# Patient Record
Sex: Female | Born: 1935 | Race: White | Hispanic: No | Smoking: Former smoker
Health system: Southern US, Community
[De-identification: ages and names within clinical notes are randomized; demographics above are authoritative.]

## PROBLEM LIST (undated history)

## (undated) DIAGNOSIS — I10 Essential (primary) hypertension: Secondary | ICD-10-CM

## (undated) DIAGNOSIS — G629 Polyneuropathy, unspecified: Secondary | ICD-10-CM

## (undated) DIAGNOSIS — H3530 Unspecified macular degeneration: Secondary | ICD-10-CM

## (undated) DIAGNOSIS — F32A Depression, unspecified: Secondary | ICD-10-CM

## (undated) DIAGNOSIS — F419 Anxiety disorder, unspecified: Secondary | ICD-10-CM

## (undated) DIAGNOSIS — F329 Major depressive disorder, single episode, unspecified: Secondary | ICD-10-CM

## (undated) DIAGNOSIS — L853 Xerosis cutis: Secondary | ICD-10-CM

## (undated) DIAGNOSIS — H6120 Impacted cerumen, unspecified ear: Secondary | ICD-10-CM

## (undated) DIAGNOSIS — M1990 Unspecified osteoarthritis, unspecified site: Secondary | ICD-10-CM

## (undated) HISTORY — DX: Unspecified osteoarthritis, unspecified site: M19.90

## (undated) HISTORY — PX: APPENDECTOMY: SHX54

## (undated) HISTORY — DX: Impacted cerumen, unspecified ear: H61.20

## (undated) HISTORY — PX: CHOLECYSTECTOMY: SHX55

## (undated) HISTORY — PX: RHINOPLASTY: SUR1284

## (undated) HISTORY — PX: SHOULDER SURGERY: SHX246

## (undated) HISTORY — DX: Unspecified macular degeneration: H35.30

## (undated) HISTORY — DX: Anxiety disorder, unspecified: F41.9

## (undated) HISTORY — DX: Depression, unspecified: F32.A

## (undated) HISTORY — DX: Xerosis cutis: L85.3

## (undated) HISTORY — DX: Essential (primary) hypertension: I10

## (undated) HISTORY — DX: Polyneuropathy, unspecified: G62.9

## (undated) HISTORY — DX: Major depressive disorder, single episode, unspecified: F32.9

---

## 1993-08-23 DIAGNOSIS — K5190 Ulcerative colitis, unspecified, without complications: Secondary | ICD-10-CM | POA: Insufficient documentation

## 1999-06-22 ENCOUNTER — Encounter: Payer: Self-pay | Admitting: Family Medicine

## 1999-06-22 ENCOUNTER — Encounter: Admission: RE | Admit: 1999-06-22 | Discharge: 1999-06-22 | Payer: Self-pay | Admitting: Family Medicine

## 1999-07-01 ENCOUNTER — Other Ambulatory Visit: Admission: RE | Admit: 1999-07-01 | Discharge: 1999-07-01 | Payer: Self-pay | Admitting: Family Medicine

## 2000-07-01 ENCOUNTER — Encounter: Payer: Self-pay | Admitting: Family Medicine

## 2000-07-01 ENCOUNTER — Encounter: Admission: RE | Admit: 2000-07-01 | Discharge: 2000-07-01 | Payer: Self-pay | Admitting: Family Medicine

## 2000-09-09 ENCOUNTER — Other Ambulatory Visit: Admission: RE | Admit: 2000-09-09 | Discharge: 2000-09-09 | Payer: Self-pay | Admitting: Family Medicine

## 2000-12-21 DIAGNOSIS — G609 Hereditary and idiopathic neuropathy, unspecified: Secondary | ICD-10-CM | POA: Insufficient documentation

## 2003-03-24 LAB — HM DEXA SCAN

## 2003-10-22 ENCOUNTER — Other Ambulatory Visit: Admission: RE | Admit: 2003-10-22 | Discharge: 2003-10-22 | Payer: Self-pay | Admitting: Family Medicine

## 2003-10-22 LAB — CONVERTED CEMR LAB: Pap Smear: NORMAL

## 2004-01-21 ENCOUNTER — Encounter: Admission: RE | Admit: 2004-01-21 | Discharge: 2004-04-20 | Payer: Self-pay | Admitting: Rheumatology

## 2004-02-20 LAB — HM COLONOSCOPY: HM Colonoscopy: ABNORMAL

## 2004-06-29 ENCOUNTER — Ambulatory Visit: Payer: Self-pay | Admitting: Family Medicine

## 2004-08-25 ENCOUNTER — Ambulatory Visit: Payer: Self-pay | Admitting: Family Medicine

## 2004-09-15 ENCOUNTER — Ambulatory Visit: Payer: Self-pay | Admitting: Internal Medicine

## 2004-12-09 ENCOUNTER — Ambulatory Visit: Payer: Self-pay | Admitting: Family Medicine

## 2005-01-01 ENCOUNTER — Ambulatory Visit: Payer: Self-pay | Admitting: Family Medicine

## 2005-01-26 ENCOUNTER — Ambulatory Visit: Payer: Self-pay | Admitting: Family Medicine

## 2005-04-09 ENCOUNTER — Ambulatory Visit: Payer: Self-pay | Admitting: Family Medicine

## 2005-05-13 ENCOUNTER — Ambulatory Visit: Payer: Self-pay | Admitting: Family Medicine

## 2005-06-24 ENCOUNTER — Ambulatory Visit: Payer: Self-pay | Admitting: Family Medicine

## 2005-07-29 ENCOUNTER — Ambulatory Visit: Payer: Self-pay | Admitting: Family Medicine

## 2005-09-22 ENCOUNTER — Ambulatory Visit: Payer: Self-pay | Admitting: Family Medicine

## 2005-10-20 ENCOUNTER — Ambulatory Visit: Payer: Self-pay | Admitting: Family Medicine

## 2006-02-01 ENCOUNTER — Ambulatory Visit: Payer: Self-pay | Admitting: Family Medicine

## 2006-02-07 ENCOUNTER — Ambulatory Visit: Payer: Self-pay | Admitting: Family Medicine

## 2006-02-09 ENCOUNTER — Ambulatory Visit: Payer: Self-pay | Admitting: Family Medicine

## 2006-02-20 LAB — FECAL OCCULT BLOOD, GUAIAC: Fecal Occult Blood: NEGATIVE

## 2006-03-11 ENCOUNTER — Ambulatory Visit: Payer: Self-pay | Admitting: Family Medicine

## 2006-03-22 ENCOUNTER — Ambulatory Visit: Payer: Self-pay | Admitting: Family Medicine

## 2006-06-29 ENCOUNTER — Ambulatory Visit: Payer: Self-pay | Admitting: Family Medicine

## 2006-11-03 ENCOUNTER — Encounter: Payer: Self-pay | Admitting: Family Medicine

## 2006-11-03 DIAGNOSIS — I10 Essential (primary) hypertension: Secondary | ICD-10-CM | POA: Insufficient documentation

## 2006-11-03 DIAGNOSIS — G43909 Migraine, unspecified, not intractable, without status migrainosus: Secondary | ICD-10-CM | POA: Insufficient documentation

## 2006-11-03 DIAGNOSIS — M353 Polymyalgia rheumatica: Secondary | ICD-10-CM | POA: Insufficient documentation

## 2006-11-03 DIAGNOSIS — Z862 Personal history of diseases of the blood and blood-forming organs and certain disorders involving the immune mechanism: Secondary | ICD-10-CM

## 2006-11-03 DIAGNOSIS — K589 Irritable bowel syndrome without diarrhea: Secondary | ICD-10-CM

## 2006-11-03 DIAGNOSIS — M1990 Unspecified osteoarthritis, unspecified site: Secondary | ICD-10-CM | POA: Insufficient documentation

## 2006-11-03 DIAGNOSIS — N6019 Diffuse cystic mastopathy of unspecified breast: Secondary | ICD-10-CM

## 2007-02-09 ENCOUNTER — Ambulatory Visit: Payer: Self-pay | Admitting: Family Medicine

## 2007-02-09 ENCOUNTER — Encounter: Payer: Self-pay | Admitting: Family Medicine

## 2007-02-13 ENCOUNTER — Encounter (INDEPENDENT_AMBULATORY_CARE_PROVIDER_SITE_OTHER): Payer: Self-pay | Admitting: *Deleted

## 2007-03-29 ENCOUNTER — Ambulatory Visit: Payer: Self-pay | Admitting: Family Medicine

## 2007-03-29 DIAGNOSIS — Q809 Congenital ichthyosis, unspecified: Secondary | ICD-10-CM | POA: Insufficient documentation

## 2007-03-30 ENCOUNTER — Ambulatory Visit: Payer: Self-pay | Admitting: Family Medicine

## 2007-03-30 DIAGNOSIS — E7800 Pure hypercholesterolemia, unspecified: Secondary | ICD-10-CM | POA: Insufficient documentation

## 2007-03-31 LAB — CONVERTED CEMR LAB
ALT: 18 units/L (ref 0–35)
Albumin: 3.9 g/dL (ref 3.5–5.2)
BUN: 27 mg/dL — ABNORMAL HIGH (ref 6–23)
Eosinophils Absolute: 0.3 10*3/uL (ref 0.0–0.6)
GFR calc non Af Amer: 75 mL/min
HDL: 39.3 mg/dL (ref >39.0)
Lymphocytes Relative: 37.5 % (ref 12.0–46.0)
MCHC: 34.4 g/dL (ref 30.0–36.0)
MCV: 92.8 fL (ref 78.0–100.0)
Monocytes Relative: 9.8 % (ref 3.0–11.0)
Neutro Abs: 3.5 10*3/uL (ref 1.4–7.7)
Phosphorus: 3.9 mg/dL (ref 2.3–4.6)
Platelets: 235 10*3/uL (ref 150–400)
Potassium: 4.1 meq/L (ref 3.5–5.1)
Triglycerides: 169 mg/dL — ABNORMAL HIGH (ref 0–149)
VLDL: 34 mg/dL (ref 0–40)

## 2007-04-05 ENCOUNTER — Encounter: Payer: Self-pay | Admitting: Family Medicine

## 2007-04-06 ENCOUNTER — Encounter: Payer: Self-pay | Admitting: Family Medicine

## 2007-04-19 ENCOUNTER — Encounter (INDEPENDENT_AMBULATORY_CARE_PROVIDER_SITE_OTHER): Payer: Self-pay | Admitting: *Deleted

## 2007-06-01 ENCOUNTER — Telehealth: Payer: Self-pay | Admitting: Family Medicine

## 2007-06-07 ENCOUNTER — Encounter: Payer: Self-pay | Admitting: Family Medicine

## 2007-06-16 ENCOUNTER — Ambulatory Visit: Payer: Self-pay | Admitting: Family Medicine

## 2007-07-13 ENCOUNTER — Telehealth: Payer: Self-pay | Admitting: Family Medicine

## 2007-07-17 ENCOUNTER — Telehealth: Payer: Self-pay | Admitting: Family Medicine

## 2007-10-30 ENCOUNTER — Ambulatory Visit: Payer: Self-pay | Admitting: Family Medicine

## 2007-10-30 DIAGNOSIS — F329 Major depressive disorder, single episode, unspecified: Secondary | ICD-10-CM

## 2007-10-30 DIAGNOSIS — F419 Anxiety disorder, unspecified: Secondary | ICD-10-CM

## 2007-10-30 DIAGNOSIS — K3189 Other diseases of stomach and duodenum: Secondary | ICD-10-CM

## 2007-10-30 DIAGNOSIS — R1013 Epigastric pain: Secondary | ICD-10-CM

## 2007-10-30 LAB — CONVERTED CEMR LAB: Glucose, Bld: 104 mg/dL

## 2007-11-03 ENCOUNTER — Telehealth: Payer: Self-pay | Admitting: Family Medicine

## 2007-11-13 ENCOUNTER — Telehealth: Payer: Self-pay | Admitting: Family Medicine

## 2007-11-27 ENCOUNTER — Telehealth: Payer: Self-pay | Admitting: Family Medicine

## 2007-12-12 ENCOUNTER — Ambulatory Visit: Payer: Self-pay | Admitting: Family Medicine

## 2008-01-25 ENCOUNTER — Telehealth: Payer: Self-pay | Admitting: Family Medicine

## 2008-02-06 ENCOUNTER — Telehealth: Payer: Self-pay | Admitting: Family Medicine

## 2008-03-14 ENCOUNTER — Telehealth: Payer: Self-pay | Admitting: Family Medicine

## 2008-03-15 ENCOUNTER — Telehealth (INDEPENDENT_AMBULATORY_CARE_PROVIDER_SITE_OTHER): Payer: Self-pay | Admitting: *Deleted

## 2008-03-20 ENCOUNTER — Encounter: Payer: Self-pay | Admitting: Family Medicine

## 2008-03-20 ENCOUNTER — Ambulatory Visit: Payer: Self-pay | Admitting: Family Medicine

## 2008-04-22 ENCOUNTER — Telehealth: Payer: Self-pay | Admitting: Family Medicine

## 2008-04-24 ENCOUNTER — Ambulatory Visit: Payer: Self-pay | Admitting: Family Medicine

## 2008-04-24 DIAGNOSIS — N951 Menopausal and female climacteric states: Secondary | ICD-10-CM

## 2008-05-07 ENCOUNTER — Telehealth (INDEPENDENT_AMBULATORY_CARE_PROVIDER_SITE_OTHER): Payer: Self-pay | Admitting: *Deleted

## 2008-05-08 ENCOUNTER — Ambulatory Visit: Payer: Self-pay | Admitting: Family Medicine

## 2008-05-08 LAB — CONVERTED CEMR LAB
Alkaline Phosphatase: 73 units/L (ref 39–117)
BUN: 24 mg/dL — ABNORMAL HIGH (ref 6–23)
Bilirubin, Direct: 0.1 mg/dL (ref 0.0–0.3)
CO2: 21 meq/L (ref 19–32)
Chloride: 112 meq/L (ref 96–112)
Creatinine, Ser: 0.8 mg/dL (ref 0.4–1.2)
Eosinophils Absolute: 0 10*3/uL (ref 0.0–0.7)
Eosinophils Relative: 0.1 % (ref 0.0–5.0)
GFR calc Af Amer: 90 mL/min
Glucose, Bld: 115 mg/dL — ABNORMAL HIGH (ref 70–99)
HDL: 58.9 mg/dL (ref >39.0)
MCV: 90.4 fL (ref 78.0–100.0)
Neutrophils Relative %: 75.9 % (ref 43.0–77.0)
Platelets: 254 10*3/uL (ref 150–400)
Potassium: 4 meq/L (ref 3.5–5.1)
RDW: 13 % (ref 11.5–14.6)
Total Bilirubin: 0.7 mg/dL (ref 0.3–1.2)
Total CHOL/HDL Ratio: 2.9
Total Protein: 7 g/dL (ref 6.0–8.3)
Triglycerides: 111 mg/dL (ref 0–149)
VLDL: 22 mg/dL (ref 0–40)
WBC: 13.1 10*3/uL — ABNORMAL HIGH (ref 4.5–10.5)

## 2008-05-15 ENCOUNTER — Ambulatory Visit: Payer: Self-pay | Admitting: Family Medicine

## 2008-05-15 LAB — CONVERTED CEMR LAB
Bilirubin Urine: NEGATIVE
Blood in Urine, dipstick: NEGATIVE
Glucose, Urine, Semiquant: NEGATIVE
Ketones, urine, test strip: NEGATIVE
Mucus, UA: 0
Protein, U semiquant: NEGATIVE
RBC / HPF: 0
Urobilinogen, UA: 0.2
WBC, UA: 0 cells/hpf
Yeast, UA: 0
pH: 6

## 2008-05-16 ENCOUNTER — Encounter: Admission: RE | Admit: 2008-05-16 | Discharge: 2008-05-16 | Payer: Self-pay | Admitting: Orthopedic Surgery

## 2008-05-27 ENCOUNTER — Telehealth: Payer: Self-pay | Admitting: Family Medicine

## 2008-05-29 ENCOUNTER — Ambulatory Visit: Payer: Self-pay | Admitting: Family Medicine

## 2008-05-30 LAB — CONVERTED CEMR LAB
Eosinophils Absolute: 0.2 10*3/uL (ref 0.0–0.7)
Eosinophils Relative: 1.9 % (ref 0.0–5.0)
Glucose, Bld: 82 mg/dL (ref 70–99)
HCT: 40.4 % (ref 36.0–46.0)
Hemoglobin: 13.7 g/dL (ref 12.0–15.0)
MCV: 93.6 fL (ref 78.0–100.0)
Monocytes Absolute: 1.1 10*3/uL — ABNORMAL HIGH (ref 0.1–1.0)
Monocytes Relative: 8.3 % (ref 3.0–12.0)
Neutro Abs: 7.7 10*3/uL (ref 1.4–7.7)
Platelets: 200 10*3/uL (ref 150–400)
RDW: 14.4 % (ref 11.5–14.6)

## 2008-05-31 ENCOUNTER — Telehealth: Payer: Self-pay | Admitting: Family Medicine

## 2008-06-05 ENCOUNTER — Telehealth: Payer: Self-pay | Admitting: Family Medicine

## 2008-06-12 ENCOUNTER — Ambulatory Visit: Payer: Self-pay | Admitting: Family Medicine

## 2008-06-13 LAB — CONVERTED CEMR LAB
Eosinophils Absolute: 0.2 10*3/uL (ref 0.0–0.7)
HCT: 38.7 % (ref 36.0–46.0)
MCV: 95.8 fL (ref 78.0–100.0)
Monocytes Absolute: 1.1 10*3/uL — ABNORMAL HIGH (ref 0.1–1.0)
Neutrophils Relative %: 48 % (ref 43.0–77.0)
Platelets: 237 10*3/uL (ref 150–400)
RDW: 14.9 % — ABNORMAL HIGH (ref 11.5–14.6)
WBC: 8.9 10*3/uL (ref 4.5–10.5)

## 2008-06-26 ENCOUNTER — Telehealth: Payer: Self-pay | Admitting: Family Medicine

## 2008-06-27 ENCOUNTER — Emergency Department: Payer: Self-pay | Admitting: Emergency Medicine

## 2008-06-27 ENCOUNTER — Encounter: Payer: Self-pay | Admitting: Family Medicine

## 2008-07-02 ENCOUNTER — Telehealth: Payer: Self-pay | Admitting: Family Medicine

## 2008-07-15 ENCOUNTER — Telehealth (INDEPENDENT_AMBULATORY_CARE_PROVIDER_SITE_OTHER): Payer: Self-pay | Admitting: *Deleted

## 2008-07-29 ENCOUNTER — Encounter: Admission: RE | Admit: 2008-07-29 | Discharge: 2008-07-29 | Payer: Self-pay | Admitting: Orthopedic Surgery

## 2008-08-28 ENCOUNTER — Ambulatory Visit: Payer: Self-pay | Admitting: Family Medicine

## 2008-11-07 ENCOUNTER — Telehealth (INDEPENDENT_AMBULATORY_CARE_PROVIDER_SITE_OTHER): Payer: Self-pay | Admitting: *Deleted

## 2008-11-11 ENCOUNTER — Ambulatory Visit: Payer: Self-pay | Admitting: Family Medicine

## 2008-11-11 DIAGNOSIS — F411 Generalized anxiety disorder: Secondary | ICD-10-CM | POA: Insufficient documentation

## 2008-12-25 ENCOUNTER — Ambulatory Visit: Payer: Self-pay | Admitting: Family Medicine

## 2009-01-07 ENCOUNTER — Encounter: Admission: RE | Admit: 2009-01-07 | Discharge: 2009-01-07 | Payer: Self-pay | Admitting: Orthopedic Surgery

## 2009-01-10 ENCOUNTER — Ambulatory Visit (HOSPITAL_COMMUNITY): Admission: RE | Admit: 2009-01-10 | Discharge: 2009-01-12 | Payer: Self-pay | Admitting: Orthopedic Surgery

## 2009-01-15 ENCOUNTER — Telehealth: Payer: Self-pay | Admitting: Family Medicine

## 2009-02-26 ENCOUNTER — Ambulatory Visit: Payer: Self-pay | Admitting: Family Medicine

## 2009-04-21 ENCOUNTER — Telehealth: Payer: Self-pay | Admitting: Family Medicine

## 2009-05-12 ENCOUNTER — Telehealth: Payer: Self-pay | Admitting: Family Medicine

## 2009-05-16 ENCOUNTER — Ambulatory Visit: Payer: Self-pay | Admitting: Family Medicine

## 2009-05-27 ENCOUNTER — Telehealth: Payer: Self-pay | Admitting: Family Medicine

## 2009-06-12 ENCOUNTER — Ambulatory Visit: Payer: Self-pay | Admitting: Family Medicine

## 2009-06-12 ENCOUNTER — Encounter: Payer: Self-pay | Admitting: Family Medicine

## 2009-06-23 ENCOUNTER — Encounter: Payer: Self-pay | Admitting: Family Medicine

## 2009-08-20 ENCOUNTER — Ambulatory Visit: Payer: Self-pay | Admitting: Family Medicine

## 2009-08-24 LAB — CONVERTED CEMR LAB
ALT: 22 units/L (ref 0–35)
AST: 23 units/L (ref 0–37)
Albumin: 3.9 g/dL (ref 3.5–5.2)
Alkaline Phosphatase: 73 units/L (ref 39–117)
BUN: 17 mg/dL (ref 6–23)
Basophils Absolute: 0.1 10*3/uL (ref 0.0–0.1)
CO2: 30 meq/L (ref 19–32)
Calcium: 9.1 mg/dL (ref 8.4–10.5)
Chloride: 103 meq/L (ref 96–112)
Cholesterol: 172 mg/dL (ref 0–200)
Eosinophils Relative: 2.8 % (ref 0.0–5.0)
Glucose, Bld: 82 mg/dL (ref 70–99)
HDL: 45.2 mg/dL (ref >39.00)
Lymphocytes Relative: 23 % (ref 12.0–46.0)
Lymphs Abs: 3.2 10*3/uL (ref 0.7–4.0)
Monocytes Relative: 8.9 % (ref 3.0–12.0)
Neutrophils Relative %: 64.6 % (ref 43.0–77.0)
Phosphorus: 4.2 mg/dL (ref 2.3–4.6)
Platelets: 215 10*3/uL (ref 150.0–400.0)
Potassium: 3.4 meq/L — ABNORMAL LOW (ref 3.5–5.1)
RDW: 13 % (ref 11.5–14.6)
Sodium: 140 meq/L (ref 135–145)
TSH: 1.92 microintl units/mL (ref 0.35–5.50)
Total Protein: 6.7 g/dL (ref 6.0–8.3)
Triglycerides: 190 mg/dL — ABNORMAL HIGH (ref 0.0–149.0)
WBC: 14 10*3/uL — ABNORMAL HIGH (ref 4.5–10.5)

## 2009-09-09 ENCOUNTER — Telehealth: Payer: Self-pay | Admitting: Family Medicine

## 2009-09-12 ENCOUNTER — Ambulatory Visit: Payer: Self-pay | Admitting: Family Medicine

## 2009-09-12 LAB — CONVERTED CEMR LAB
Bacteria, UA: 0
Blood in Urine, dipstick: NEGATIVE
Glucose, Urine, Semiquant: NEGATIVE
Nitrite: NEGATIVE
Specific Gravity, Urine: >=1.03
WBC, UA: 0 cells/hpf
Yeast, UA: 0

## 2009-09-15 ENCOUNTER — Encounter: Admission: RE | Admit: 2009-09-15 | Discharge: 2009-09-15 | Payer: Self-pay | Admitting: Family Medicine

## 2009-09-19 ENCOUNTER — Ambulatory Visit: Payer: Self-pay | Admitting: Oncology

## 2009-09-23 ENCOUNTER — Telehealth: Payer: Self-pay | Admitting: Family Medicine

## 2009-10-17 ENCOUNTER — Ambulatory Visit: Payer: Self-pay | Admitting: Family Medicine

## 2009-10-20 LAB — CONVERTED CEMR LAB
Basophils Absolute: 0.1 10*3/uL (ref 0.0–0.1)
Eosinophils Absolute: 0.2 10*3/uL (ref 0.0–0.7)
HCT: 36.6 % (ref 36.0–46.0)
Hemoglobin: 12.1 g/dL (ref 12.0–15.0)
Lymphs Abs: 2.7 10*3/uL (ref 0.7–4.0)
MCHC: 33.1 g/dL (ref 30.0–36.0)
MCV: 95.8 fL (ref 78.0–100.0)
Monocytes Absolute: 0.8 10*3/uL (ref 0.1–1.0)
Monocytes Relative: 12 % (ref 3.0–12.0)
Neutro Abs: 2.8 10*3/uL (ref 1.4–7.7)
Platelets: 235 10*3/uL (ref 150.0–400.0)
RDW: 12.5 % (ref 11.5–14.6)

## 2009-11-18 ENCOUNTER — Telehealth: Payer: Self-pay | Admitting: Family Medicine

## 2010-05-22 ENCOUNTER — Telehealth: Payer: Self-pay | Admitting: Family Medicine

## 2010-07-22 ENCOUNTER — Ambulatory Visit: Payer: Self-pay | Admitting: Family Medicine

## 2010-07-22 ENCOUNTER — Encounter: Payer: Self-pay | Admitting: Family Medicine

## 2010-07-22 LAB — HM MAMMOGRAPHY: HM Mammogram: NORMAL

## 2010-07-27 ENCOUNTER — Encounter: Payer: Self-pay | Admitting: Family Medicine

## 2010-09-14 ENCOUNTER — Telehealth (INDEPENDENT_AMBULATORY_CARE_PROVIDER_SITE_OTHER): Payer: Self-pay | Admitting: *Deleted

## 2010-09-15 ENCOUNTER — Ambulatory Visit
Admission: RE | Admit: 2010-09-15 | Discharge: 2010-09-15 | Payer: Self-pay | Source: Home / Self Care | Attending: Family Medicine | Admitting: Family Medicine

## 2010-09-15 ENCOUNTER — Other Ambulatory Visit: Payer: Self-pay | Admitting: Family Medicine

## 2010-09-15 ENCOUNTER — Encounter: Payer: Self-pay | Admitting: Family Medicine

## 2010-09-15 LAB — HEPATIC FUNCTION PANEL
ALT: 20 U/L (ref 0–35)
Total Bilirubin: 0.7 mg/dL (ref 0.3–1.2)

## 2010-09-15 LAB — LIPID PANEL
HDL: 40.3 mg/dL (ref >39.00)
Total CHOL/HDL Ratio: 4
Triglycerides: 211 mg/dL — ABNORMAL HIGH (ref 0.0–149.0)
VLDL: 42.2 mg/dL — ABNORMAL HIGH (ref 0.0–40.0)

## 2010-09-15 LAB — RENAL FUNCTION PANEL
Calcium: 9.6 mg/dL (ref 8.4–10.5)
Creatinine, Ser: 1 mg/dL (ref 0.4–1.2)
GFR: 59.42 mL/min — ABNORMAL LOW (ref >60.00)
Glucose, Bld: 106 mg/dL — ABNORMAL HIGH (ref 70–99)
Phosphorus: 3.3 mg/dL (ref 2.3–4.6)
Sodium: 141 mEq/L (ref 135–145)

## 2010-09-15 LAB — CBC WITH DIFFERENTIAL/PLATELET
Basophils Relative: 0.7 % (ref 0.0–3.0)
Eosinophils Relative: 1.8 % (ref 0.0–5.0)
MCV: 93.4 fl (ref 78.0–100.0)
Monocytes Absolute: 0.8 10*3/uL (ref 0.1–1.0)
Neutrophils Relative %: 59.1 % (ref 43.0–77.0)
RBC: 4.04 Mil/uL (ref 3.87–5.11)
WBC: 10.1 10*3/uL (ref 4.5–10.5)

## 2010-09-15 LAB — LDL CHOLESTEROL, DIRECT: Direct LDL: 103 mg/dL

## 2010-09-17 LAB — CONVERTED CEMR LAB: Vit D, 25-Hydroxy: 63 ng/mL (ref 30–89)

## 2010-09-22 NOTE — Progress Notes (Signed)
Summary: Update from patient regarding white blood cell count  Phone Note Call from Patient Call back at Home Phone [phone]   Caller: Patient Call For: Judith Part MD Summary of Call: Patient called and stated that she went to the dentist to have some work done on her teeth, and the dentist advised her that she had an abscess.  They called it a silent abscess because she had no pain or swelling.  He advised her that could have been the reason for her elevated white blood cell count.  She wanted you to be aware of this.  Does she need to keep her appt with Dr. Park Breed?  Does she need to come in for a repeat CBC?  Please advise. Initial call taken by: Linde Gillis CMA Duncan Dull),  September 23, 2009 8:00 AM  Follow-up for Phone Call        that could definitely be the cause  can cancel with Dr Park Breed sched appt for cbc with diff about a week after abcess is totally resolved and she has been tx with antibiotics Follow-up by: Judith Part MD,  September 23, 2009 8:07 AM  Additional Follow-up for Phone Call Additional follow up Details #1::        Advised patient.  ......................................................Marland KitchenNatasha Chavers CMA (AAMA) September 23, 2009 11:01 AM

## 2010-09-22 NOTE — Progress Notes (Signed)
Summary: refill request on gabapentin  Phone Note Refill Request Message from:  Fax from Pharmacy  Refills Requested: Medication #1:  GABAPENTIN 300 MG CAPS 1 by mouth qam [facility] [address], they are requiring a 90 day script, per pt's insurance.  Initial call taken by: Lowella Petties CMA,  November 18, 2009 12:43 PM  Follow-up for Phone Call        px written on EMR for call in  Follow-up by: Judith Part MD,  November 18, 2009 1:27 PM  Additional Follow-up for Phone Call Additional follow up Details #1::        Rx called to pharmacy Additional Follow-up by: Linde Gillis CMA Duncan Dull),  November 18, 2009 2:02 PM    Prescriptions: GABAPENTIN 300 MG CAPS (GABAPENTIN) 1 by mouth qam, one at lunch  and 2 by mouth at bedtime  #360 x 5   Entered and Authorized by:   Judith Part MD   Signed by:   Linde Gillis CMA (AAMA) on 11/18/2009   Method used:   Telephoned to ...       [facility] ([facility]       [address]       [place], [place]  [address]       Ph: [phone] or [phone]       Fax: [phone]   RxID:   [id number]

## 2010-09-22 NOTE — Progress Notes (Signed)
Summary: Rx Paxil  Phone Note Call from Patient   Caller: Patient Summary of Call: Patient called and left a message stating that she [facility] [phone] for Paxil.  She says that taking 1/2 tablet in the morning and 1/2 tablet in the evening is working well for her.  Please advise.   Initial call taken by: Linde Gillis CMA Duncan Dull),  September 09, 2009 4:33 PM  Follow-up for Phone Call        px written on EMR for call in  Follow-up by: Judith Part MD,  September 09, 2009 5:00 PM  Additional Follow-up for Phone Call Additional follow up Details #1::        [facility] Additional Follow-up by: Lowella Petties CMA,  September 09, 2009 5:19 PM    Prescriptions: PAXIL 20 MG TABS (PAROXETINE HCL) 1/2  by mouth twice daily  #30 x 11   Entered and Authorized by:   Judith Part MD   Signed by:   Judith Part MD on 09/09/2009   Method used:   Telephoned to ...       [facility] ([facility]ail[facility]       [address]       [place], [place]  [address]       Ph: [phone] or [phone]       Fax: [phone]   RxID:   [id number]

## 2010-09-22 NOTE — Letter (Signed)
Summary: Results Follow up Letter  [place] [facility]  [address] [place], [place] [address]   Phone: [phone]  Fax: [phone]    07/27/2010 MRN: [id number]    Mally Berrey [address] [place], [place]  [address]    Dear Ms. Edward Qualia,  The following are the results of your recent test(s):  Test         Result    Pap Smear:        Normal _____  Not Normal _____ Comments: ______________________________________________________ Cholesterol: LDL(Bad cholesterol):         Your goal is less than:         HDL (Good cholesterol):       Your goal is more than: Comments:  ______________________________________________________ Mammogram:        Normal ___X__  Not Normal _____ Comments:Repeat in one year.   ___________________________________________________________________ Hemoccult:        Normal _____  Not normal _______ Comments:    _____________________________________________________________________ Other Tests:    We routinely do not discuss normal results over the telephone.  If you desire a copy of the results, or you have any questions about this information we can discuss them at your next office visit.   Sincerely,    Idamae Schuller Tower,MD  MT/ri
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
## 2010-09-22 NOTE — Progress Notes (Signed)
  Phone Note Call from Patient   Caller: Patient Summary of Call: Patient needs order for her yearly MMG at [facility]. Wants an afternoon appt. Please call her at [phone]. Initial call taken by: Carlton Adam,  May 22, 2010 10:08 AM  Follow-up for Phone Call        will do ref and send to [facility] Follow-up by: Judith Part MD,  May 22, 2010 12:20 PM

## 2010-09-24 NOTE — Progress Notes (Signed)
----   Converted from flag ---- ---- 09/13/2010 11:38 AM, Colon Flattery Tower MD wrote: please check lipid/ renal/ hepatic/ cbc with diff / tsh and vit D for leukocytosis, 733.0, 272 and 401.1  thanks  ---- 09/11/2010 11:37 AM, Liane Comber CMA (AAMA) wrote: Lab orders please! Good Morning! This pt is scheduled for cpx labs Tuesday, which labs to draw and dx codes to use? Thanks Tasha ------------------------------

## 2010-09-24 NOTE — Assessment & Plan Note (Signed)
Summary: B/P PER DR Annalina Needles/JRT   Nurse Visit   Vital Signs:  Patient profile:   77 year old female Temp:     97.9 degrees F oral Pulse rate:   64 / minute Pulse rhythm:   regular BP sitting:   124 / 78 Cuff size:   regular  Allergies: 1)  ! Nsaids 2)  * Allegra 3)  Codeine 4)  Ceclor 5)  Advil 6)  Claritin 7)  * Cymbalta 8)  Ampicillin  Orders Added: 1)  Est. Patient Level I [09811]   Vital Signs:  Patient Profile:   77 Years Old Female CC:      Patient came by office to have her BP checked to make sure that her machine is working. Patient did not bring her machine, but plans on checking it when she get homes. Patient states that she has been talking her BP medication and does not have any complaints today. Patient stated that she will bring her machine in at her next office visit to make sure that it is working correctly. Patient states that she has been getting good readings with her machine. Height:     62.5 inches Temp:     97.9 degrees F oral Pulse rate:   64 / minute Pulse rhythm:   regular BP sitting:   124 / 78  (left arm) Cuff size:   regular  Vitals Entered By: Sydell Axon LPN (September 15, 2010 11:36 AM)

## 2010-09-25 ENCOUNTER — Encounter (INDEPENDENT_AMBULATORY_CARE_PROVIDER_SITE_OTHER): Payer: Medicare Other | Admitting: Family Medicine

## 2010-09-25 ENCOUNTER — Ambulatory Visit: Admit: 2010-09-25 | Payer: Self-pay | Admitting: Family Medicine

## 2010-09-25 ENCOUNTER — Encounter: Payer: Self-pay | Admitting: Family Medicine

## 2010-09-25 DIAGNOSIS — K3189 Other diseases of stomach and duodenum: Secondary | ICD-10-CM

## 2010-09-25 DIAGNOSIS — F411 Generalized anxiety disorder: Secondary | ICD-10-CM

## 2010-09-25 DIAGNOSIS — M810 Age-related osteoporosis without current pathological fracture: Secondary | ICD-10-CM

## 2010-09-25 DIAGNOSIS — R1013 Epigastric pain: Secondary | ICD-10-CM

## 2010-09-25 DIAGNOSIS — K5190 Ulcerative colitis, unspecified, without complications: Secondary | ICD-10-CM

## 2010-09-25 DIAGNOSIS — Q809 Congenital ichthyosis, unspecified: Secondary | ICD-10-CM

## 2010-09-25 DIAGNOSIS — E7800 Pure hypercholesterolemia, unspecified: Secondary | ICD-10-CM

## 2010-09-25 DIAGNOSIS — I10 Essential (primary) hypertension: Secondary | ICD-10-CM

## 2010-10-06 ENCOUNTER — Ambulatory Visit: Payer: Self-pay | Admitting: Family Medicine

## 2010-10-06 ENCOUNTER — Encounter: Payer: Self-pay | Admitting: Family Medicine

## 2010-10-08 NOTE — Assessment & Plan Note (Signed)
Summary: CPX/ DLO   Vital Signs:  Patient profile:   77 year old female Height:      62.25 inches Weight:      136.75 pounds BMI:     24.90 Temp:     98.1 degrees F oral Pulse rate:   60 / minute Pulse rhythm:   regular BP sitting:   140 / 76  (left arm) Cuff size:   regular  Vitals Entered By: Lewanda Rife LPN (September 25, 2010 2:40 PM)  Serial Vital Signs/Assessments:  Time      Position  BP       Pulse  Resp  Temp     By                     138/72                         Judith Part MD  CC: CPX   History of Present Illness: here for check up of chronic med problems and to review health mt list   wt is down 4 lb with bmi of 24 likes sweets - and careful with sweets   is doing well overall  more heartburn over past several months - zantac was not controlling it so she start pepcid/ famotidine 10 mg with tums mixed in it (generic pepcid AC)  watching diet  has to watch diet for irritable bowel  no stress   HTN -- 140/76   anxiety- on 20 of paxil thinks that has been in good control    OP- dexa 8/08- wants to schedule that  has occ falls from neuropathy -- takes a cane with her to walk her cane  ca and D vit D level good at 63 is interested in shingles vaccine   lipids ok with trig 211 and HDL 40 and LDL 103  Td 07 ptx 05 flu shot --[facility]  zoster status- wants to get shot   pap 01/17/23- no problems or bleeding or pain or spotting  not sexually active at all  never had abn pap  mam 11/11 nl -- no lumps on self exam   colonosc 6/05 -colitis  ? 10 year follow up - suspects   is walking for exercise     Allergies: 1)  ! 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 of C.Diff Diarrhea Dexa - low normal bone density 11/00 Dexa - imiproved 8/04 Cardiolite - Negative 3/05 Colonoscopy - colitis, neg. biopsy 6/05 Opthy - floaters L shoulder pain-- injections  L shoulder surgery- replacement   Family History: Last updated: Jan 16, 2009 Father: dead at 65, ETOH- chronic, heart disease, renal failure (dialysis) Mother: dead at 42, meningitis with ear infection, HTN, mini CVA's, 2 major CVA's Siblings: 1 brother alive with HTN  1 sister alive with HTN, polymyalgia CVA's maternal x 2 sister with melanoma  Heart Disease strong on father's side HTN both side of family colon cancer in maternal aunt x 2, liver in maternal uncle  Social History: Last updated: 05/15/2008 Marital Status: Married x 2 Children: 2 children, 6 grandchildren Occupation:  Regular exercise-yes non smoker   Risk Factors: Exercise: yes (11/03/2006)  Risk Factors: Smoking Status: never (11/03/2006)  Review of Systems General:  Denies chills, fatigue, fever, loss of appetite, and malaise. Eyes:  Denies blurring and eye irritation. CV:  Denies chest pain or discomfort, lightheadness, and palpitations. Resp:  Denies cough and shortness of breath. GI:  Complains of indigestion; denies abdominal pain, bloody stools, change in bowel habits, nausea, and vomiting. GU:  Denies abnormal vaginal bleeding, discharge, dysuria, and urinary frequency. MS:  Complains of joint pain and stiffness; denies muscle aches and cramps. Derm:   Denies itching, lesion(s), poor wound healing, and rash. Neuro:  Denies headaches, numbness, and tingling. Psych:  Denies panic attacks, sense of great danger, and suicidal thoughts/plans. Endo:  Denies cold intolerance, excessive thirst, excessive urination, and heat intolerance. Heme:  Denies abnormal bruising and bleeding.  Physical Exam  General:  Well-developed,well-nourished,in no acute distress; alert,appropriate and cooperative throughout examination Head:  normocephalic, atraumatic, and no abnormalities observed.   Eyes:  vision grossly intact, pupils equal, pupils round, and pupils reactive to light.  no conjunctival pallor, injection or icterus  Ears:  R ear normal and L ear normal.   Nose:  no nasal discharge.   Mouth:  pharynx pink and moist.   Neck:  supple with full rom and no masses or thyromegally, no JVD or carotid bruit  Chest Wall:  No deformities, masses, or tenderness noted. Breasts:  No mass, nodules, thickening, tenderness, bulging, retraction, inflamation, nipple discharge or skin changes noted.   Lungs:  Normal respiratory effort, chest expands symmetrically. Lungs are clear to auscultation, no crackles or wheezes. Heart:  Normal rate and regular rhythm. S1 and S2 normal without gallop, murmur, click, rub or other extra sounds. Abdomen:  Bowel sounds positive,abdomen soft and non-tender without masses, organomegaly or hernias noted. no renal bruits  Msk:  No deformity or scoliosis noted of thoracic or lumbar spine.  no acute joint changes  baseline prominent bunions  Pulses:  R and L carotid,radial,femoral,dorsalis pedis and posterior tibial pulses are full and equal bilaterally Extremities:  No clubbing, cyanosis, edema, or deformity noted with normal full range of motion of all joints.   Neurologic:  sensation intact to light touch, gait normal, and DTRs symmetrical and normal.   Skin:  icthyosis is apparent- baseline  much solar damage/ lentios on legs several  scabs from recent cryotx on chest and back  Cervical Nodes:  No lymphadenopathy noted Axillary Nodes:  No palpable lymphadenopathy Inguinal Nodes:  No significant adenopathy Psych:  calm and pleasant    Impression & Recommendations:  Problem # 1:  ANXIETY (ICD-300.00) Assessment Unchanged  doing well on paxil also without stress no changes  Her updated medication list for this problem includes:    Paxil 20 Mg Tabs (Paroxetine hcl) .Marland Kitchen... 1/2  by mouth three times a day  Orders: Prescription Created Electronically [phone])  Problem # 2:  DYSPEPSIA (ICD-536.8) Assessment: Deteriorated  may be worsened by advil pm disc diet  pepcid as needed  if worse needs to update me- awar   Orders: Prescription Created Electronically [id number])  Problem # 3:  OSTEOPOROSIS NOS (ICD-733.00) Assessment: Unchanged  is due for dexa - will schedule that  rev need for exercise and ca and D D level is good  Her updated medication list for this problem includes:    Caltrate 600+d 600-400 Mg-unit Tabs (Calcium carbonate-vitamin d) ..... One tablet by mouth twice daily  Orders: Radiology Referral (Radiology) Prescription Created Electronically [phone])  Problem # 4:  HYPERCHOLESTEROLEMIA, PURE (ICD-272.0) Assessment: Unchanged  this is in good control with diet- commended rev low sat fat diet   Labs Reviewed: SGOT: 23 (09/15/2010)   SGPT: 20 (09/15/2010)   HDL:40.30 (09/15/2010), 45.20 (08/20/2009)  LDL:89 (08/20/2009), 89 (05/08/2008)  Chol:174 (09/15/2010), 172 (08/20/2009)  Trig:211.0 (09/15/2010), 190.0 (08/20/2009)  Orders: Prescription Created Electronically [phone])  Problem # 5:  ICHTHYOSIS (ICD-757.1) Assessment: Unchanged  this is unchanged I discoraged use of tanning bed to tx this  enc her to disc with her dermatologist - has f/u soon  Orders: Prescription Created Electronically [phone])  Problem # 6:  COLITIS, ULCERATIVE NOS (ICD-556.9) Assessment: Unchanged  ? when due  for colonosc  pt suffers from IBS will check on this   Orders: Prescription Created Electronically [id number])  Problem # 7:  HYPERTENSION (ICD-401.9) Assessment: Unchanged  is fairly well controlled no change in med- refilled enc to keep exercise and mt wt  Her updated medication list for this problem includes:    Propranolol Hcl Cr 120 Mg Cp24 (Propranolol hcl) .Marland Kitchen... 1 twice a day by mouth    Lisinopril-hydrochlorothiazide 20-12.5 Mg Tabs (Lisinopril-hydrochlorothiazide) .Marland Kitchen... 1/2 by mouth two times a day  BP today: 140/76 Prior BP: 124/78 (09/15/2010)  Labs Reviewed: K+: 4.5 (09/15/2010) Creat: : 1.0 (09/15/2010)   Chol: 174 (09/15/2010)   HDL: 40.30 (09/15/2010)   LDL: 89 (08/20/2009)   TG: 211.0 (09/15/2010)  Orders: Prescription Created Electronically [phone])  Complete Medication List: 1)  Gabapentin 300 Mg Caps (Gabapentin) .Marland Kitchen.. 1 by mouth qam,   and 2 by mouth at bedtime 2)  Propranolol Hcl Cr 120 Mg Cp24 (Propranolol hcl) .Marland Kitchen.. 1 twice a day by mouth 3)  Lisinopril-hydrochlorothiazide 20-12.5 Mg Tabs (Lisinopril-hydrochlorothiazide) .... 1/2 by mouth two times a day 4)  Fluticasone Propionate 50 Mcg/act Susp (Fluticasone propionate) .... 2 squirts  each nostril daily 5)  Laclotion 12 % Lotn (Ammonium lactate) 6)  Lotrisone 1-0.05 % Lotn (Clotrimazole-betamethasone) .... Apply small amt to corners of mouth once daily as needed - do not use for more than 14 days 7)  Icaps Caps (Multiple vitamins-minerals) .... One capsule by mouth twice daily 8)  Pepcid Ac 10 Mg Tabs (Famotidine) .Marland Kitchen.. 1 -2 by mouth once daily as needed 9)  Paxil 20 Mg Tabs (Paroxetine hcl) .... 1/2  by mouth three times a day 10)  Caltrate 600+d 600-400 Mg-unit Tabs (Calcium carbonate-vitamin d) .... One tablet by mouth twice daily 11)  Multivitamins Tabs (Multiple vitamin) .... Take 1 tablet by mouth once a day 12)  Advil Pm 200-38 Mg Tabs (Ibuprofen-diphenhydramine cit) .... Otc as directed.  Patient  Instructions: 1)  take the pepcid ac daily (generic ) and watch diet  2)  call your insurance company to see if shingles vaccine (zostavax) is covered - and then let us know  3)  please call for last colonoscopy or see if in her old chart  4)  we will refer you for dexa at check out  5)  keep working on healthy diet and exercise  6)  think about getting a recumbant exercise bike for exercise if you are worried about falling --  also could consider going to a gym or [employer]  Prescriptions: PAXIL 20 MG TABS (PAROXETINE HCL) 1/2  by mouth three times a day  #135 x 3   Entered and Authorized by:   Judith Part MD   Signed by:   Judith Part MD on 09/25/2010   Method used:   [facility]        [facility] (retail[facility]       [address]       [place], [place]  [address]       Ph: [phone] or [phone]       Fax: [phone]   RxID:   [id number] FLUTICASONE PROPIONATE 50 MCG/ACT SUSP (FLUTICASONE PROPIONATE) 2 squirts  each nostril daily  #3 mdi x 3   Entered and Authorized by:   Judith Part MD   Signed by:   Judith Part MD on 09/25/2010   Method used:   [facility]        [facility] ([facility]ail)       [address]       [place], [place]  [address]       Ph: [phone] or [phone]       Fax: [phone]   RxID:   [id number] LISINOPRIL-HYDROCHLOROTHIAZIDE 20-12.5 MG TABS (LISINOPRIL-HYDROCHLOROTHIAZIDE) 1/2 by mouth two times a day  #90 x 3   Entered and Authorized by:   Judith Part MD   Signed by:   Judith Part MD on 09/25/2010   Method used:   [facility]        [facility] (retail)       [address]       [place], [place]  [address]       Ph: [phone] or [phone]       Fax: [phone]   RxID:   [id number] PROPRANOLOL HCL CR 120 MG CP24 (PROPRANOLOL HCL) 1 twice a day by mouth  #180 x 3   Entered and Authorized by:   Judith Part MD   Signed by:   Judith Part MD on 09/25/2010   Method used:   [facility]        [id number] ([facility]       [address]       [place], [place]  [address]       Ph: [phone] or [phone]       Fax: [phone]   RxID:   [id number]    Orders Added: 1)  Radiology Referral [Radiology] 2)  Prescription Created Electronically [G8553] 3)  Est. Patient Level IV [09326]   Immunization History:  Influenza Immunization History:    Influenza:  fluvax 3+ (06/08/2010)   Immunization History:  Influenza Immunization History:    Influenza:  Fluvax 3+ (06/08/2010)  Current Allergies (reviewed today): ! NSAIDS * ALLEGRA CODEINE CECLOR ADVIL CLARITIN * CYMBALTA AMPICILLIN

## 2010-10-12 ENCOUNTER — Encounter (INDEPENDENT_AMBULATORY_CARE_PROVIDER_SITE_OTHER): Payer: Self-pay | Admitting: *Deleted

## 2010-10-14 ENCOUNTER — Telehealth: Payer: Self-pay | Admitting: Family Medicine

## 2010-10-15 ENCOUNTER — Ambulatory Visit (INDEPENDENT_AMBULATORY_CARE_PROVIDER_SITE_OTHER): Payer: Medicare Other

## 2010-10-15 ENCOUNTER — Encounter: Payer: Self-pay | Admitting: Family Medicine

## 2010-10-15 DIAGNOSIS — Z2911 Encounter for prophylactic immunotherapy for respiratory syncytial virus (RSV): Secondary | ICD-10-CM

## 2010-10-15 DIAGNOSIS — Z23 Encounter for immunization: Secondary | ICD-10-CM

## 2010-10-20 NOTE — Assessment & Plan Note (Signed)
Summary: ZOSTAVAX   Nurse Visit   Allergies: 1)  ! Nsaids 2)  * Allegra 3)  Codeine 4)  Ceclor 5)  Advil 6)  Claritin 7)  * Cymbalta 8)  Ampicillin  Immunizations Administered:  Zostavax # 1:    Vaccine Type: Zostavax    Site: right deltoid    Mfr: Merck    Dose: 0.65    Route: [place]    Given by: Selena Batten Dance CMA (AAMA)    Exp. Date: 04/10/2011    Lot #: [id number]    VIS given: 06/04/05 given October 15, 2010.  Orders Added: 1)  Zoster (Shingles) Vaccine Live [90736] 2)  Admin 1st Vaccine [phone]

## 2010-10-20 NOTE — Progress Notes (Signed)
Summary: insurance will cover zostavax  Phone Note Call from Patient Call back at Home Phone [phone]   Caller: Patient Summary of Call: Pt called to report that her insurance will pay for zostavax.  Nurse appt scheduled. Initial call taken by: Lowella Petties CMA, AAMA,  October 14, 2010 2:33 PM  Follow-up for Phone Call        good- thanks for the update Follow-up by: Judith Part MD,  October 14, 2010 3:10 PM

## 2010-10-20 NOTE — Letter (Signed)
Summary: Results Follow up Letter  [facility]  [address] [place], [place] [address]   Phone: [phone]  Fax: [phone]    10/12/2010 MRN: [id number]  Suzanne Monroe [address] [place], [place]  [address]  [place]  Dear Ms. Edward Qualia,  The following are the results of your recent test(s):  Test         Result    Pap Smear:        Normal _____  Not Normal _____ Comments: ______________________________________________________ Cholesterol: LDL(Bad cholesterol):         Your goal is less than:         HDL (Good cholesterol):       Your goal is more than: Comments:  ______________________________________________________ Mammogram:        Normal _____  Not Normal _____ Comments:  ___________________________________________________________________ Hemoccult:        Normal _____  Not normal _______ Comments:    _____________________________________________________________________ Other Tests: Your bone denisty scan showed normal results. Please continue your calcium, vitamin D and exercise as tolerated. We will plan on repeating this scan in 2 years.    We routinely do not discuss normal results over the telephone.  If you desire a copy of the results, or you have any questions about this information we can discuss them at your next office visit.   Sincerely,      Sharilyn Sites for Dr. Roxy Manns

## 2010-12-01 LAB — BASIC METABOLIC PANEL
BUN: 10 mg/dL (ref 6–23)
BUN: 16 mg/dL (ref 6–23)
CO2: 24 mEq/L (ref 19–32)
Calcium: 9.4 mg/dL (ref 8.4–10.5)
Chloride: 107 mEq/L (ref 96–112)
Chloride: 103 mEq/L (ref 96–112)
Creatinine, Ser: 0.67 mg/dL (ref 0.4–1.2)
Creatinine, Ser: 0.84 mg/dL (ref 0.4–1.2)
GFR calc Af Amer: >60 mL/min (ref >60)
Glucose, Bld: 105 mg/dL — ABNORMAL HIGH (ref 70–99)
Potassium: 4 mEq/L (ref 3.5–5.1)

## 2010-12-01 LAB — URINALYSIS, ROUTINE W REFLEX MICROSCOPIC
Glucose, UA: NEGATIVE mg/dL
Ketones, ur: NEGATIVE mg/dL
Nitrite: NEGATIVE
Specific Gravity, Urine: 1.02 (ref 1.005–1.030)
pH: 7 (ref 5.0–8.0)

## 2010-12-01 LAB — TYPE AND SCREEN
ABO/RH(D): O POS
Donor AG Type: NEGATIVE

## 2010-12-01 LAB — DIFFERENTIAL
Basophils Absolute: 0 10*3/uL (ref 0.0–0.1)
Basophils Relative: 1 % (ref 0–1)
Lymphocytes Relative: 37 % (ref 12–46)
Monocytes Absolute: 0.8 10*3/uL (ref 0.1–1.0)
Monocytes Relative: 10 % (ref 3–12)
Neutro Abs: 4 10*3/uL (ref 1.7–7.7)
Neutrophils Relative %: 51 % (ref 43–77)

## 2010-12-01 LAB — PROTIME-INR: INR: 1 (ref 0.00–1.49)

## 2010-12-01 LAB — CBC
HCT: 35.8 % — ABNORMAL LOW (ref 36.0–46.0)
MCHC: 34.4 g/dL (ref 30.0–36.0)
MCHC: 34.1 g/dL (ref 30.0–36.0)
MCV: 92.8 fL (ref 78.0–100.0)
MCV: 91.9 fL (ref 78.0–100.0)
Platelets: 134 10*3/uL — ABNORMAL LOW (ref 150–400)
Platelets: 226 10*3/uL (ref 150–400)
RBC: 4.33 MIL/uL (ref 3.87–5.11)
WBC: 8.8 10*3/uL (ref 4.0–10.5)
WBC: 7.8 10*3/uL (ref 4.0–10.5)

## 2010-12-01 LAB — APTT: aPTT: 33 seconds (ref 24–37)

## 2011-01-05 ENCOUNTER — Other Ambulatory Visit: Payer: Self-pay | Admitting: *Deleted

## 2011-01-05 MED ORDER — CLOTRIMAZOLE-BETAMETHASONE 1-0.05 % EX CREA: 1.0000 "application " | TOPICAL_CREAM | Freq: Two times a day (BID) | CUTANEOUS | Status: DC

## 2011-01-05 NOTE — Telephone Encounter (Signed)
Ok to refill? Hasn't been filled since 2010

## 2011-01-05 NOTE — Op Note (Signed)
NAME:  Suzanne Monroe, Suzanne Monroe                 ACCOUNT NO.:  [id number]   MEDICAL RECORD NO.:  [id number]          PATIENT TYPE:  AMB   LOCATION:  SDS                          FACILITY:  [facility]   PHYSICIAN:  Almedia Balls. Ranell Patrick, M.D. DATE OF BIRTH:  October 22, 1932   DATE OF PROCEDURE:  01/10/2009  DATE OF DISCHARGE:                               OPERATIVE REPORT   PREOPERATIVE DIAGNOSIS:  Left shoulder end-stage osteoarthritis.   POSTOPERATIVE DIAGNOSIS:  Left shoulder end-stage osteoarthritis.   PROCEDURE PERFORMED:  Left total shoulder replacement using DePuy Global  system with anchor peg glenoid.   ATTENDING SURGEON:  Almedia Balls. Ranell Patrick, MD   ASSISTANT:  Donnie Coffin. Dixon, PA-C   ANESTHESIA:  General anesthesia plus interscalene block was used.   ESTIMATED BLOOD LOSS:  Minimal.   FLUID REPLACEMENT:  1800 mL crystalloid.   URINE OUTPUT:  150 mL.   INSTRUMENT COUNTS:  Correct.   COMPLICATIONS:  None.   Perioperative antibiotics were given.   INDICATIONS:  The patient is a 77 year old female with worsening left  shoulder pain secondary to end-stage osteoarthritis.  The patient has  failed conservative management consisting of injections, anti-  inflammatories, activity modifications, and presents now for operative  treatment to restore function and eliminate pain.  Informed consent was  obtained.   DESCRIPTION OF PROCEDURE:  After an adequate level of anesthesia was  achieved, the patient was positioned in the beach chair position.  Left  shoulder was examined under anesthesia.  She had a passive forward  elevation to about 120 degrees.  External rotations about 20-30.  Internal rotation to her abdomen.  After sterile prep and drape of the  shoulder, we entered the shoulder through a deltopectoral approach.  Dissection down through the subcutaneous tissues using Bovie cephalic  vein identified taken laterally with the deltoid, pectoralis taken  medially.  The upper centimeter pectoralis  insertion of humerus was  released.  Conjoint tendon taken medially.  The subscapularis taken off  the lesser tuberosity starting from the bicipital groove.  A #2  FiberWire placed in the free end of the tendon.  The tendon was  mobilized from the underlying capsule in the coracoid process.  Next,  attention was directed towards release of soft tissues and the proximal  humerus.  Once this was done, we went ahead and osteotomized the humerus  using the neck cut guide having our superior portion of the cut right at  the insertion of the rotator cuff.  We removed inferior osteophytes.  We  then progressively externally rotated the humerus to deliver it out of  the wound and then went ahead and broached humerus up to a size 8, we  could not get the 10 in. We initially reamed and then broached.  She had  a very narrow humeral diaphysis.  At this point, with the trial 8 stem  in place with appropriate 10-15 degrees retroversion, we subluxed the  humerus posteriorly exposing the glenoid.  We removed the glenoid labrum  circumferentially releasing the soft tissues from the glenoid neck and  then went ahead and assessed  and there was 0 cartilage remaining on the  glenoid face and the glenoid version was appropriate.  We went ahead and  drilled a central hole and then reamed down to subchondral bleeding  bone.  We then went ahead and sized her to size 40 and then drilled her  next sized up central hole and placed her anchor peg glenoid drill guide  in place positioning her inferior pegs appropriately and then drilled  her 3 peripheral pegs.  Once that was done, we placed a 40 trial anchor-  peg glenoid in and then went ahead and trailed with a 44 head.  A 44  head gave [place] good coverage.  The 18 was not thickened up.  The 21 gave  [place] perfect soft tissue balancing, removed the trail components.  We went  ahead and placed two #2 FiberWire sutures to drill holes in the upper  humerus near the insertion  of the subscapularis.  We then impaction  grafted the size 8 porous-coated DePuy Global Advantage stem in place.  We began appropriate retroversion at appropriate height.  We then  checked again our humeral trials to look for coverage and looks like  eccentric may give a slightly better coverage, so we are going to do the  44 eccentric with the 21 thickness.  We next went ahead took out her  trial component from her glenoid.  We placed thrombin soaked Gelfoam in  the peripheral holes and then went ahead and cemented the peripheral  holes with DePuy 1 cement though a [place] syringe.  The central hole was  not cemented.  We placed our anchor peg glenoid and impacted that in  place and then held pressure on that for 12-13 minutes.  Once the cement  was hardened, we went and checked for excess cement, none was found.  We  thoroughly irrigated the shoulder.  We then trailed again with 44 x 21  eccentric and we were happy with soft tissue balancing and coverage.  We  placed the real 44 x 21 eccentric head in place impacted that in Morris  taper, checked it for stability, was nice and  stable.  We then prepared  the subscap anatomically including rotator interval closure with an  anatomic repair.  External rotation of about 30 degrees following  surgery with no tension through that 30-degree arc of external rotation,  forward elevation, cross body arm motion was intact.  We thoroughly  irrigated and closed the deltopectoral interval with 0 Vicryl suture  followed by 2-0 Vicryl  subcutaneous closure and 4-0 Monocryl for skin.  Steri-Strips were applied followed by a sterile dressing.  The patient  tolerated the surgery well.      Almedia Balls. Ranell Patrick, M.D.  Electronically Signed     SRN/MEDQ  D:  01/10/2009  T:  01/11/2009  Job:  [id number]

## 2011-01-05 NOTE — H&P (Signed)
NAME:  Suzanne Monroe, Suzanne Monroe                 ACCOUNT NO.:  [id number]   MEDICAL RECORD NO.:  [id number]          PATIENT TYPE:  AMB   LOCATION:  DAY                          FACILITY:  [facility]   PHYSICIAN:  Almedia Balls. Ranell Patrick, M.D. DATE OF BIRTH:  1933-04-25   DATE OF ADMISSION:  01/10/2009  DATE OF DISCHARGE:                              HISTORY & PHYSICAL   CHIEF COMPLAINT:  Left shoulder pain.   HISTORY OF PRESENT ILLNESS:  The patient is a 77 year old female with  worsening left shoulder pain secondary to osteoarthritis.  The patient  has elected have a left total shoulder arthroplasty by Dr. Malon Kindle.   PAST MEDICAL HISTORY:  Hypertension, depression, gallbladder disease,  irritable bowel syndrome, and migraines.   Family medical history is negative.   SOCIAL HISTORY:  Patient of Dr. Milinda Antis.  Does not smoke or use alcohol.   DRUG ALLERGIES:  CODEINE.   CURRENT MEDICATIONS:  1. Propranolol 120 mg b.i.d.  2. Lisinopril/hydrochlorothiazide 20/12.5 mg daily.  3. Neurontin 300 mg t.i.d.  4. Darvocet-N 100 1-2 tabs q.4-6 hours p.r.n. pain.  5. Caltrate 600 mg b.i.d.  6. Flonase 1 spray each nostril daily.  7. Zantac p.r.n.   REVIEW OF SYSTEMS:  Pain with range of motion, also has a history of  neuropathy in her feet.   PHYSICAL EXAMINATION:  VITAL SIGNS:  Pulse 80, respirations 16, blood  pressure 130/78.  GENERAL:  Healthy-appearing 77 year old female in no acute distress.  Pleasant m[place] and affect.  Alert and oriented x3.  HEAD AND NECK:  Cranial nerves II through XII are grossly intact.  Neck  shows full range of motion without any tenderness.  CHEST:  Active breath sounds bilaterally with no wheezes, rhonchi, or  rales.  HEART:  Regular rate and rhythm, but she does have a faintly murmur 1/6  or 2/6 systolic click.  ABDOMEN:  Nontender, nondistended.  EXTREMITIES:  Moderate tenderness, decreased range of motion of left  shoulder with forward flexion, 80 degrees external  rotation, 30 degrees  internal rotation.  SKIN:  No rashes and no pedal edema distally.  X-rays show end-stage  osteoarthritis of the left shoulder.   IMPRESSION:  End-stage osteoarthritis left shoulder.   PLAN OF ACTION:  Left total shoulder arthroplasty by Dr. Malon Kindle.      Thomas B. Durwin Nora, P.A.      Almedia Balls. Ranell Patrick, M.D.  Electronically Signed    TBD/MEDQ  D:  01/01/2009  T:  01/02/2009  Job:  [id number]

## 2011-01-05 NOTE — Telephone Encounter (Signed)
Px written for call in   

## 2011-01-05 NOTE — Telephone Encounter (Signed)
Medication phoned to [facility] pharmacy as instructed. Patient notified as instructed by telephone.

## 2011-01-15 ENCOUNTER — Other Ambulatory Visit: Payer: Self-pay | Admitting: *Deleted

## 2011-01-15 MED ORDER — PAROXETINE HCL 20 MG PO TABS: ORAL_TABLET | ORAL | Status: DC

## 2011-02-12 ENCOUNTER — Other Ambulatory Visit: Payer: Self-pay | Admitting: Family Medicine

## 2011-02-12 NOTE — Telephone Encounter (Signed)
Sent in

## 2011-02-12 NOTE — Telephone Encounter (Signed)
Patient last seen 09-25-2010  Verified on patient medication list in EMR 1 Q AM AND 2 AT BEDTIME NEUROTIN 300MG 

## 2011-04-06 ENCOUNTER — Emergency Department (HOSPITAL_COMMUNITY): Payer: Medicare Other

## 2011-04-06 ENCOUNTER — Emergency Department (HOSPITAL_COMMUNITY)
Admission: EM | Admit: 2011-04-06 | Discharge: 2011-04-07 | Disposition: A | Discharge status: Nursing Home (Includes ICF) | Payer: Medicare Other | Source: Home / Self Care | Attending: Emergency Medicine | Admitting: Emergency Medicine

## 2011-04-07 ENCOUNTER — Emergency Department (HOSPITAL_COMMUNITY): Payer: Medicare Other

## 2011-04-07 ENCOUNTER — Emergency Department (HOSPITAL_COMMUNITY)
Admission: EM | Admit: 2011-04-07 | Discharge: 2011-04-07 | Disposition: A | Discharge status: Home / Self Care | Payer: Medicare Other | Attending: Emergency Medicine | Admitting: Emergency Medicine

## 2011-04-07 DIAGNOSIS — I10 Essential (primary) hypertension: Secondary | ICD-10-CM | POA: Insufficient documentation

## 2011-04-07 DIAGNOSIS — S139XXA Sprain of joints and ligaments of unspecified parts of neck, initial encounter: Secondary | ICD-10-CM | POA: Insufficient documentation

## 2011-04-07 DIAGNOSIS — W010XXA Fall on same level from slipping, tripping and stumbling without subsequent striking against object, initial encounter: Secondary | ICD-10-CM | POA: Insufficient documentation

## 2011-04-07 DIAGNOSIS — M542 Cervicalgia: Secondary | ICD-10-CM | POA: Insufficient documentation

## 2011-04-07 DIAGNOSIS — S0990XA Unspecified injury of head, initial encounter: Secondary | ICD-10-CM | POA: Insufficient documentation

## 2011-04-07 DIAGNOSIS — Y92009 Unspecified place in unspecified non-institutional (private) residence as the place of occurrence of the external cause: Secondary | ICD-10-CM | POA: Insufficient documentation

## 2011-04-07 DIAGNOSIS — Z79899 Other long term (current) drug therapy: Secondary | ICD-10-CM | POA: Insufficient documentation

## 2011-04-19 ENCOUNTER — Encounter: Payer: Self-pay | Admitting: Family Medicine

## 2011-04-20 ENCOUNTER — Encounter: Payer: Self-pay | Admitting: Family Medicine

## 2011-04-20 ENCOUNTER — Ambulatory Visit (INDEPENDENT_AMBULATORY_CARE_PROVIDER_SITE_OTHER): Payer: Medicare Other | Admitting: Family Medicine

## 2011-04-20 VITALS — BP 134/84 | HR 52 | Temp 97.8°F | Wt 142.0 lb

## 2011-04-20 DIAGNOSIS — S199XXA Unspecified injury of neck, initial encounter: Secondary | ICD-10-CM | POA: Insufficient documentation

## 2011-04-20 NOTE — Progress Notes (Signed)
Subjective:    Patient ID: Suzanne Monroe, female    DOB: 03-Oct-1932, 77 y.o.   MRN: [id number]  HPI Here for f/u of ER visit on 8/14 She had a fall in kitchen - slipped backwards on a rug and hit her head - on hard wood floors   At first had question of CS fx on CT- then this was re checked by MRI (after being on [place]) and that found no fracture Was in ambulance twice  CT of the head  Did put her in a collar at discharge  Seen by Dr Yetta Barre - needs to get appt with him - but current phone number does not work -- cannot get though   Has been in fair amt of pain -- now is quite a bit improved -- mostly in her upper back  More on the R side than the left  Turning her head to the R is worst position   She has gotten all her rugs up to prevent further falls She is not as steady as she used to be   No numbness or weakness in arms and hands Kept a good attitude also   Takes occ advil with food -- once per day -really helps -with no GI upset   Patient Active Problem List  Diagnoses  . HYPERCHOLESTEROLEMIA, PURE  . ANXIETY  . DEPRESSION  . MIGRAINE, CHRONIC  . PERIPHERAL NEUROPATHY  . HYPERTENSION  . DYSPEPSIA  . COLITIS, ULCERATIVE NOS  . IRRITABLE BOWEL SYNDROME  . FIBROCYSTIC BREAST DISEASE  . MENOPAUSAL SYNDROME  . OSTEOARTHRITIS  . POLYMYALGIA RHEUMATICA  . ICHTHYOSIS  . ANA POSITIVE, HX OF  . Neck injury   Past Medical History  Diagnosis Date  . Hypertension   . Arthritis     OA  . 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 Macular degeneration   . Anxiety   . Depression   . Migraine     Hx of  . Cerumen impaction     recurrent Lt  . Peripheral neuropathy    Past Surgical History  Procedure Date  . Appendectomy   . Cholecystectomy   . Rhinoplasty   . Shoulder surgery     left shoulder replacement   History  Substance Use Topics  . Smoking status: Former Smoker    Quit date: 08/24/1983  . Smokeless tobacco: Not  on file  . Alcohol Use: No   Family History  Problem Relation Age of Onset  . Stroke Mother   . Hypertension Mother   . Alcohol abuse Father   . Heart disease Father   . Kidney disease Father     dialysis  . Hypertension Brother   . Hypertension Daughter   . Cancer Maternal Aunt     colon CA  . Cancer Maternal Uncle     liver CA   Allergies  Allergen Reactions  . Ampicillin     REACTION: reaction not known  . Cefaclor     REACTION: diarrhea  . Codeine     REACTION: diarrhea  . Duloxetine     REACTION: severe diarrhea  . Fexofenadine     REACTION: fuzzy  . Ibuprofen     REACTION: GI  . Loratadine     REACTION: blurred vision  . Nsaids     REACTION: fatigue   Current Outpatient Prescriptions on File Prior to Visit  Medication Sig Dispense Refill  . ammonium lactate (LAC-HYDRIN)  12 % lotion Apply topically as needed.        . Calcium Carbonate-Vitamin D 600-400 MG-UNIT per tablet Take 1 tablet by mouth 2 (two) times daily.        . clotrimazole-betamethasone (LOTRISONE) cream Apply 1 application topically 2 (two) times daily. Apply small amount to corners of mouth twice daily for no more than 14 days.  30 g  1  . fluticasone (FLONASE) 50 MCG/ACT nasal spray Place 2 sprays into the nose daily.        Marland Kitchen gabapentin (NEURONTIN) 300 MG capsule TAKE 1 CAPSULE EACH MORNING,1 AT LUNCH AND 2 AT BEDTIME  360 capsule  3  . lisinopril-hydrochlorothiazide (PRINZIDE,ZESTORETIC) 20-12.5 MG per tablet Take 0.5 tablets by mouth 2 (two) times daily.        . Multiple Vitamin (MULTIVITAMIN) tablet Take 1 tablet by mouth daily.        . Multiple Vitamins-Minerals (ICAPS) CAPS Take 1 capsule by mouth 2 (two) times daily.        Marland Kitchen PARoxetine (PAXIL) 20 MG tablet Take 1/2 tablet by mouth three times daily  135 tablet  1  . propranolol (INDERAL LA) 120 MG 24 hr capsule Take 120 mg by mouth 2 (two) times daily.        . Ibuprofen-Diphenhydramine Cit (ADVIL PM) 200-38 MG TABS Take by mouth as  directed.            Review of Systems Review of Systems  Constitutional: Negative for fever, appetite change, fatigue and unexpected weight change.  Eyes: Negative for pain and visual disturbance.  Respiratory: Negative for cough and shortness of breath.   Cardiovascular: Negative.  For cp or palpitations Gastrointestinal: Negative for nausea, diarrhea and constipation.  Genitourinary: Negative for urgency and frequency.  Skin: Negative for pallor. or rash  MSK pos for upper back and neck pain  Neurological: Negative for weakness, light-headedness, numbness and headaches.  Hematological: Negative for adenopathy. Does not bruise/bleed easily.  Psychiatric/Behavioral: Negative for dysphoric mood. The patient is not nervous/anxious.          Objective:   Physical Exam  Constitutional: She appears well-developed and well-nourished.  HENT:  Head: Normocephalic and atraumatic.  Mouth/Throat: Oropharynx is clear and moist.  Eyes: Conjunctivae and EOM are normal. Pupils are equal, round, and reactive to light.  Neck: Normal range of motion. Neck supple. No JVD present. No thyromegaly present.  Cardiovascular: Normal rate, regular rhythm, normal heart sounds and intact distal pulses.   Pulmonary/Chest: Effort normal and breath sounds normal. No respiratory distress. She has no wheezes.  Abdominal: Soft. Bowel sounds are normal. She exhibits no distension. There is no tenderness.  Musculoskeletal: Normal range of motion. She exhibits tenderness. She exhibits no edema.       Mild lower cervical spine tenderness - more so on the R  Tender R pericervical and thoracic musculature- as well as trapezius and rhomboid areas  Full rom neck-- painful to rotate L   Lymphadenopathy:    She has no cervical adenopathy.  Neurological: She is alert. She has normal strength and normal reflexes. No cranial nerve deficit or sensory deficit. Coordination normal.  Skin: Skin is warm and dry. No rash noted.  No erythema. No pallor.  Psychiatric: She has a normal mood and affect.          Assessment & Plan:

## 2011-04-20 NOTE — Patient Instructions (Signed)
Keep wearing your neck collar  Update me if symptoms worsen  We will do neurosurgery referral at check out - with Dr Yetta Barre  Keep rugs off the floor

## 2011-05-11 ENCOUNTER — Ambulatory Visit
Admission: RE | Admit: 2011-05-11 | Discharge: 2011-05-11 | Disposition: A | Discharge status: Home / Self Care | Payer: Medicare Other | Source: Ambulatory Visit | Attending: Neurological Surgery | Admitting: Neurological Surgery

## 2011-05-11 ENCOUNTER — Other Ambulatory Visit: Payer: Self-pay | Admitting: Neurological Surgery

## 2011-05-11 DIAGNOSIS — M542 Cervicalgia: Secondary | ICD-10-CM

## 2011-06-22 ENCOUNTER — Ambulatory Visit: Payer: Medicare Other

## 2011-07-17 ENCOUNTER — Other Ambulatory Visit: Payer: Self-pay | Admitting: Family Medicine

## 2011-07-19 ENCOUNTER — Other Ambulatory Visit: Payer: Self-pay | Admitting: Family Medicine

## 2011-07-19 NOTE — Telephone Encounter (Signed)
[facility] faxed refill request Lisinopril HCTZ 20-12.5 mg # 90 x 1. [facility] sent 09/2010.

## 2011-07-20 ENCOUNTER — Telehealth: Payer: Self-pay | Admitting: *Deleted

## 2011-07-20 NOTE — Telephone Encounter (Signed)
I don't remove toenails as a procedure - so would have her see podiatry -- does she have one or does she need ref? ([facility] or [facility])?

## 2011-07-20 NOTE — Telephone Encounter (Signed)
Patient called stating that the nail is about to come off of her big toe. Patient wants to know if this is something that you can take care of or should she go to a podiatrist. Patient states that she does not know what has caused this. Please advise.

## 2011-07-21 NOTE — Telephone Encounter (Signed)
Spoke with patient and notified her that you do not remove nails and offered to do a referral. She said she already has a podiatrist but she hasn't seem him in a long time. She will call his office and schedule something. (Dr. Al Corpus in [place])

## 2011-07-22 ENCOUNTER — Encounter: Payer: Self-pay | Admitting: Family Medicine

## 2011-07-22 ENCOUNTER — Ambulatory Visit (INDEPENDENT_AMBULATORY_CARE_PROVIDER_SITE_OTHER): Payer: Medicare Other | Admitting: Family Medicine

## 2011-07-22 DIAGNOSIS — S91209A Unspecified open wound of unspecified toe(s) with damage to nail, initial encounter: Secondary | ICD-10-CM | POA: Insufficient documentation

## 2011-07-22 DIAGNOSIS — S91109A Unspecified open wound of unspecified toe(s) without damage to nail, initial encounter: Secondary | ICD-10-CM

## 2011-07-22 NOTE — Progress Notes (Signed)
  Subjective:    Patient ID: Suzanne Monroe, female    DOB: July 06, 1933, 77 y.o.   MRN: [id number]  HPI Pt of Dr Royden Purl here for nail removal due to nail avulsion that looks to be due to chronic trauma. She had peripheral neuropathy and truly doesn't feel a whole lot with her feet, needs to be careful with walking. She has very limited light touch, 2 point discrim and pain sensation on the toes. She denies acute trauma or catching the nail. She is not diabetic and is not on blood thinners. She has not had h/o nail problems in the past.    Review of SystemsNoncontributory except as above.       Objective:   Physical ExamWDWN WF NAD Left Gt nail held on by merely the most proximal part of the nail across the paranychium. Nail bed is clean but slightly sclerotic, esp on the lateral margins, looking like this has been progressively avulsing over time. Toe was prepped and draped in usual sterile fashion. Digital block with 21/2 ccs of [place] w/o was used. Nail was removed after minimal elevation and grasping with hemostat. Nailbed totally clean and no bleeding encountered. Sterile dressing with neosporin applied.        Assessment & Plan:

## 2011-07-22 NOTE — Patient Instructions (Signed)
Elevate for the next 24 hrs. Hot soaks as much as poss for the next 2-3 days. Keep clean and dry. Call or rtc if becomes inflamed.

## 2011-07-22 NOTE — Assessment & Plan Note (Signed)
Nail of left gt toe removed. Keep elevated for a few hrs. Hot soaks as frequently as possible for 48 hrs. Keep clean and dry.

## 2011-09-16 ENCOUNTER — Ambulatory Visit: Payer: Self-pay | Admitting: Family Medicine

## 2011-09-16 DIAGNOSIS — Z1231 Encounter for screening mammogram for malignant neoplasm of breast: Secondary | ICD-10-CM | POA: Diagnosis not present

## 2011-09-17 ENCOUNTER — Encounter: Payer: Self-pay | Admitting: Family Medicine

## 2011-10-08 ENCOUNTER — Other Ambulatory Visit: Payer: Self-pay

## 2011-10-08 MED ORDER — PAROXETINE HCL 20 MG PO TABS: ORAL_TABLET | ORAL | Status: DC

## 2011-10-08 NOTE — Telephone Encounter (Signed)
[facility] faxed refill Paroxertine 20 mg #135 x 1.

## 2011-10-21 DIAGNOSIS — H40009 Preglaucoma, unspecified, unspecified eye: Secondary | ICD-10-CM | POA: Diagnosis not present

## 2011-11-10 ENCOUNTER — Other Ambulatory Visit: Payer: Self-pay | Admitting: Family Medicine

## 2011-11-11 NOTE — Telephone Encounter (Signed)
Will refill electronically  

## 2011-12-05 ENCOUNTER — Other Ambulatory Visit: Payer: Self-pay | Admitting: Family Medicine

## 2012-01-03 ENCOUNTER — Ambulatory Visit (INDEPENDENT_AMBULATORY_CARE_PROVIDER_SITE_OTHER): Payer: Medicare Other | Admitting: *Deleted

## 2012-01-03 DIAGNOSIS — Z23 Encounter for immunization: Secondary | ICD-10-CM

## 2012-01-12 ENCOUNTER — Ambulatory Visit (INDEPENDENT_AMBULATORY_CARE_PROVIDER_SITE_OTHER): Payer: Medicare Other | Admitting: Family Medicine

## 2012-01-12 ENCOUNTER — Encounter: Payer: Self-pay | Admitting: Family Medicine

## 2012-01-12 VITALS — BP 120/70 | HR 74 | Temp 98.1°F | Ht 63.5 in | Wt 141.8 lb

## 2012-01-12 DIAGNOSIS — W540XXA Bitten by dog, initial encounter: Secondary | ICD-10-CM | POA: Diagnosis not present

## 2012-01-12 DIAGNOSIS — T07XXXA Unspecified multiple injuries, initial encounter: Secondary | ICD-10-CM

## 2012-01-12 DIAGNOSIS — L089 Local infection of the skin and subcutaneous tissue, unspecified: Secondary | ICD-10-CM

## 2012-01-12 DIAGNOSIS — T148XXA Other injury of unspecified body region, initial encounter: Secondary | ICD-10-CM

## 2012-01-12 MED ORDER — AMOXICILLIN-POT CLAVULANATE 875-125 MG PO TABS: 1.0000 | ORAL_TABLET | Freq: Two times a day (BID) | ORAL | Status: AC

## 2012-01-12 MED ORDER — MUPIROCIN 2 % EX OINT: TOPICAL_OINTMENT | Freq: Two times a day (BID) | CUTANEOUS | Status: AC

## 2012-01-12 NOTE — Patient Instructions (Signed)
Clean your wound with antibacterial soap and water  Keep loosely covered  Use the bactroban ointment  Take the augmentin as directed and finish it  If the wound gets more red or painful/swollen / or if any drainage - please let me know  If you run a fever let me know  Update if not starting to improve in a week or if worsening

## 2012-01-12 NOTE — Assessment & Plan Note (Signed)
Mild redness surrounding bite on R hand -- will cover with augmentin (disc poss of pasturella) - rev her med allergies - she does tolerate amoxicillin high dose for dental appts inst to dress bid with bactroban and loose dressing  Update if not starting to improve in a week or if worsening  - disc s/s to watch for

## 2012-01-12 NOTE — Progress Notes (Signed)
Subjective:    Patient ID: Suzanne Monroe, female    DOB: September 26, 1932, 78 y.o.   MRN: [id number]  HPI Was bitten by her dog -- she was asleep on the bed - and too close to the edge -- she went to move her and she snapped at her  Never bitten her before   Is older dog -- with medical problems  She is up to date on shots - is indoor dog/ utd on rabies vaccines   Got her Td on 5/13  The bite is looking a bit red  R hand  The bite itself is healing well  Was superficial - and a skin tear   No fever  Feels fine   Patient Active Problem List  Diagnoses  . HYPERCHOLESTEROLEMIA, PURE  . ANXIETY  . DEPRESSION  . MIGRAINE, CHRONIC  . PERIPHERAL NEUROPATHY  . HYPERTENSION  . DYSPEPSIA  . COLITIS, ULCERATIVE NOS  . IRRITABLE BOWEL SYNDROME  . FIBROCYSTIC BREAST DISEASE  . MENOPAUSAL SYNDROME  . OSTEOARTHRITIS  . POLYMYALGIA RHEUMATICA  . ICHTHYOSIS  . ANA POSITIVE, HX OF  . Neck injury  . Nail avulsion of toe  . Infected dog bite   Past Medical History  Diagnosis Date  . Hypertension   . Arthritis     OA  . 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 Macular degeneration   . Anxiety   . Depression   . Migraine     Hx of  . Cerumen impaction     recurrent Lt  . Peripheral neuropathy    Past Surgical History  Procedure Date  . Appendectomy   . Cholecystectomy   . Rhinoplasty   . Shoulder surgery     left shoulder replacement   History  Substance Use Topics  . Smoking status: Former Smoker    Quit date: 08/24/1983  . Smokeless tobacco: Never Used  . Alcohol Use: No   Family History  Problem Relation Age of Onset  . Stroke Mother   . Hypertension Mother   . Alcohol abuse Father   . Heart disease Father   . Kidney disease Father     dialysis  . Hypertension Brother   . Hypertension Daughter   . Cancer Maternal Aunt     colon CA  . Cancer Maternal Uncle     liver CA   Allergies  Allergen Reactions  . Ampicillin    REACTION: reaction not known  . Cefaclor     REACTION: diarrhea  . Codeine     REACTION: diarrhea  . Duloxetine     REACTION: severe diarrhea  . Fexofenadine     REACTION: fuzzy  . Ibuprofen     REACTION: GI  . Loratadine     REACTION: blurred vision  . Nsaids     REACTION: fatigue   Current Outpatient Prescriptions on File Prior to Visit  Medication Sig Dispense Refill  . ammonium lactate (LAC-HYDRIN) 12 % lotion Apply topically as needed.        . Calcium Carbonate-Vitamin D 600-400 MG-UNIT per tablet Take 1 tablet by mouth 2 (two) times daily.        . clotrimazole-betamethasone (LOTRISONE) cream Apply 1 application topically 2 (two) times daily. Apply small amount to corners of mouth twice daily for no more than 14 days.  30 g  1  . fluticasone (FLONASE) 50 MCG/ACT nasal spray Place 2 sprays into the nose daily.        Marland Kitchen  gabapentin (NEURONTIN) 300 MG capsule TAKE 1 CAPSULE EACH MORNING,1 AT LUNCH AND 2 AT BEDTIME  360 capsule  3  . ibuprofen (ADVIL,MOTRIN) 200 MG tablet Take 600 mg by mouth daily.        . Ibuprofen-Diphenhydramine Cit (ADVIL PM) 200-38 MG TABS Take by mouth as directed.       Marland Kitchen lisinopril-hydrochlorothiazide (PRINZIDE,ZESTORETIC) 20-12.5 MG per tablet TAKE 1/2 TABLET BY MOUTH TWICE DAILY  90 tablet  3  . loperamide (IMODIUM A-D) 2 MG tablet Take 2 mg by mouth 4 (four) times daily as needed.        . Multiple Vitamin (MULTIVITAMIN) tablet Take 1 tablet by mouth daily.        Marland Kitchen PARoxetine (PAXIL) 20 MG tablet Take 1/2 tablet by mouth three times daily  135 tablet  1  . propranolol (INDERAL LA) 120 MG 24 hr capsule TAKE 1 CAPSULE BY MOUTH TWICE DAILY  180 capsule  3  . ranitidine (ZANTAC) 150 MG capsule Take 150 mg by mouth 2 (two) times daily.            Review of Systems Review of Systems  Constitutional: Negative for fever, appetite change, fatigue and unexpected weight change.  Eyes: Negative for pain and visual disturbance.  Respiratory: Negative for  cough and shortness of breath.   Cardiovascular: Negative for cp or palpitations    Gastrointestinal: Negative for nausea, diarrhea and constipation.  Genitourinary: Negative for urgency and frequency.  Skin: Negative for pallor or rash  pos for animal bite with redness Neurological: Negative for weakness, light-headedness, numbness and headaches.  Hematological: Negative for adenopathy. Does not bruise/bleed easily.  Psychiatric/Behavioral: Negative for dysphoric mood. The patient is not nervous/anxious.         Objective:   Physical Exam  Constitutional: She appears well-developed and well-nourished. No distress.  HENT:  Head: Normocephalic and atraumatic.  Eyes: Conjunctivae and EOM are normal. Pupils are equal, round, and reactive to light.  Neck: Normal range of motion. Neck supple.  Cardiovascular: Normal rate and regular rhythm.   Pulmonary/Chest: Effort normal and breath sounds normal.  Lymphadenopathy:    She has no cervical adenopathy.  Neurological: She is alert. She has normal strength. No sensory deficit.  Skin: Skin is warm and dry.       Skin tear R hand - with central healing by 2ndary intention Small collar of rednes 2-3 mm  No swelling or drainage Mildly tender   Psychiatric: She has a normal mood and affect.          Assessment & Plan:

## 2012-02-18 ENCOUNTER — Other Ambulatory Visit: Payer: Self-pay | Admitting: Family Medicine

## 2012-04-20 DIAGNOSIS — H40009 Preglaucoma, unspecified, unspecified eye: Secondary | ICD-10-CM | POA: Diagnosis not present

## 2012-05-08 ENCOUNTER — Telehealth: Payer: Self-pay | Admitting: Family Medicine

## 2012-05-08 NOTE — Telephone Encounter (Signed)
Please refil her lotrisone once with one refil, thanks

## 2012-05-08 NOTE — Telephone Encounter (Signed)
Caller: [name]/Patient; Patient Name: Suzanne Monroe; PCP: [name]); Best Callback Phone Number: [phone]. Call regarding Lip Dryness, onset 1 week, Lotrisone refill request.  Patient last office visit was 01-12-12. Per EPIC, Lotrisone prescribed on 01-05-11.  All emergent symptoms ruled out per Cold Sores protocol, home care advice.  [facility] [address], [phone]. Please follow up with Patient if MD will write refill.

## 2012-05-09 NOTE — Telephone Encounter (Signed)
[facility], then called pt to let her know Rx was refilled

## 2012-05-16 DIAGNOSIS — Z23 Encounter for immunization: Secondary | ICD-10-CM | POA: Diagnosis not present

## 2012-06-03 ENCOUNTER — Other Ambulatory Visit: Payer: Self-pay | Admitting: Family Medicine

## 2012-06-05 NOTE — Telephone Encounter (Signed)
Will refill electronically  

## 2012-06-05 NOTE — Telephone Encounter (Signed)
Ok to refill? No recent labs an only 1 recent appt for a dog bite

## 2012-10-07 ENCOUNTER — Other Ambulatory Visit: Payer: Self-pay | Admitting: Family Medicine

## 2012-10-08 NOTE — Telephone Encounter (Signed)
Will refill electronically  

## 2012-10-13 ENCOUNTER — Other Ambulatory Visit: Payer: Self-pay | Admitting: Family Medicine

## 2012-10-16 NOTE — Telephone Encounter (Signed)
Ok to refill? No recent appt/labs and no future appt 

## 2012-10-16 NOTE — Telephone Encounter (Signed)
Please schedule spring f/u and refil until then, thanks

## 2012-10-17 NOTE — Telephone Encounter (Signed)
Called pt and no answer and no voicemail  

## 2012-10-18 ENCOUNTER — Other Ambulatory Visit: Payer: Self-pay | Admitting: *Deleted

## 2012-10-18 MED ORDER — PROPRANOLOL HCL ER 120 MG PO CP24: ORAL_CAPSULE | ORAL | Status: DC

## 2012-10-18 MED ORDER — LISINOPRIL-HYDROCHLOROTHIAZIDE 20-12.5 MG PO TABS: ORAL_TABLET | ORAL | Status: DC

## 2012-10-19 DIAGNOSIS — H40009 Preglaucoma, unspecified, unspecified eye: Secondary | ICD-10-CM | POA: Diagnosis not present

## 2012-10-24 ENCOUNTER — Telehealth: Payer: Self-pay | Admitting: Family Medicine

## 2012-10-24 NOTE — Telephone Encounter (Signed)
Call regarding: Mammogram; says she received a card in the mail saying she was overdue for a mammogram and the office usually schedules them; uses [facility]; also says she is overdue for an OV; PLEASE call back about the appt

## 2012-10-24 NOTE — Telephone Encounter (Signed)
Pt notified she can scheduled her own mammogram and I gave her their #, pt scheduled f/u appt for 11/17/12, 30 min since it's been over a yr since last f/u

## 2012-11-07 ENCOUNTER — Other Ambulatory Visit: Payer: Self-pay | Admitting: Family Medicine

## 2012-11-17 ENCOUNTER — Ambulatory Visit: Payer: Medicare Other | Admitting: Family Medicine

## 2012-12-21 ENCOUNTER — Other Ambulatory Visit: Payer: Self-pay | Admitting: Family Medicine

## 2012-12-21 NOTE — Telephone Encounter (Signed)
F/U appt scheduled for 12/27/12 and meds refilled

## 2012-12-27 ENCOUNTER — Encounter: Payer: Self-pay | Admitting: Family Medicine

## 2012-12-27 ENCOUNTER — Ambulatory Visit (INDEPENDENT_AMBULATORY_CARE_PROVIDER_SITE_OTHER): Payer: Medicare Other | Admitting: Family Medicine

## 2012-12-27 VITALS — BP 114/64 | HR 56 | Temp 97.8°F | Ht 63.5 in | Wt 133.5 lb

## 2012-12-27 DIAGNOSIS — S61451A Open bite of right hand, initial encounter: Secondary | ICD-10-CM

## 2012-12-27 DIAGNOSIS — S61409A Unspecified open wound of unspecified hand, initial encounter: Secondary | ICD-10-CM

## 2012-12-27 DIAGNOSIS — W540XXA Bitten by dog, initial encounter: Secondary | ICD-10-CM

## 2012-12-27 DIAGNOSIS — E7800 Pure hypercholesterolemia, unspecified: Secondary | ICD-10-CM

## 2012-12-27 DIAGNOSIS — I10 Essential (primary) hypertension: Secondary | ICD-10-CM

## 2012-12-27 DIAGNOSIS — F411 Generalized anxiety disorder: Secondary | ICD-10-CM

## 2012-12-27 DIAGNOSIS — S61459A Open bite of unspecified hand, initial encounter: Secondary | ICD-10-CM | POA: Insufficient documentation

## 2012-12-27 LAB — COMPREHENSIVE METABOLIC PANEL
ALT: 18 U/L (ref 0–35)
AST: 24 U/L (ref 0–37)
BUN: 36 mg/dL — ABNORMAL HIGH (ref 6–23)
Chloride: 103 mEq/L (ref 96–112)
GFR: 42.12 mL/min — ABNORMAL LOW (ref >60.00)
Potassium: 4.1 mEq/L (ref 3.5–5.1)
Total Protein: 7.3 g/dL (ref 6.0–8.3)

## 2012-12-27 LAB — LIPID PANEL
HDL: 42.7 mg/dL (ref >39.00)
Triglycerides: 196 mg/dL — ABNORMAL HIGH (ref 0.0–149.0)
VLDL: 39.2 mg/dL (ref 0.0–40.0)

## 2012-12-27 LAB — CBC WITH DIFFERENTIAL/PLATELET
Basophils Relative: 0.5 % (ref 0.0–3.0)
Eosinophils Absolute: 0.3 10*3/uL (ref 0.0–0.7)
Lymphocytes Relative: 34.1 % (ref 12.0–46.0)
MCHC: 33.9 g/dL (ref 30.0–36.0)
Neutrophils Relative %: 52.9 % (ref 43.0–77.0)
RBC: 4.14 Mil/uL (ref 3.87–5.11)
WBC: 9.8 10*3/uL (ref 4.5–10.5)

## 2012-12-27 LAB — TSH: TSH: 0.56 u[IU]/mL (ref 0.35–5.50)

## 2012-12-27 MED ORDER — LISINOPRIL-HYDROCHLOROTHIAZIDE 20-12.5 MG PO TABS: ORAL_TABLET | ORAL | Status: DC

## 2012-12-27 MED ORDER — PROPRANOLOL HCL ER 120 MG PO CP24: ORAL_CAPSULE | ORAL | Status: DC

## 2012-12-27 MED ORDER — FLUTICASONE PROPIONATE 50 MCG/ACT NA SUSP: 2.0000 | Freq: Every day | NASAL | Status: DC

## 2012-12-27 MED ORDER — AMOXICILLIN-POT CLAVULANATE 875-125 MG PO TABS: 1.0000 | ORAL_TABLET | Freq: Two times a day (BID) | ORAL | Status: DC

## 2012-12-27 NOTE — Assessment & Plan Note (Signed)
Lab today Rev low sat fat diet Has done well with intentional wt loss

## 2012-12-27 NOTE — Assessment & Plan Note (Signed)
bp in fair control at this time  No changes needed  Disc lifstyle change with low sodium diet and exercise  Lab today 

## 2012-12-27 NOTE — Assessment & Plan Note (Signed)
Puncture wound on dorsal R hand - with 1-2 cm of faint erythema  Provoked bite/her own immunized dog who is elderly Stressed imp of safety-will need to likely get rid of the dog  augmentin px (pt has taken this before without rxn) Disc care of wound inst to call if inc redness or swelling or pain or fever - disc risk of pasturella

## 2012-12-27 NOTE — Progress Notes (Signed)
Subjective:    Patient ID: Suzanne Monroe, female    DOB: 01/11/1933, 79 y.o.   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 - lots of itching   bp is stable today  No cp or palpitations or headaches or edema  No side effects to medicines  BP Readings from Last 3 Encounters:  12/27/12 114/64  01/12/12 120/70  07/22/11 130/78     Needs labs today  Ate sausage for breakfast - but has not eaten since  Does not watch diet closely for cholesterol all the time   Is doing fine without paxil at this time Overall mood is good   Dog bit her on R hand yesterday - some redness there today  Not too painful/ scab is there  Is utd Td from 2013  No fever  Can move all fingers- did not think it was a big deal Her own dog/ immunized/ the dog is old and blind and sometimes does this (she keeps it away from guests and children)   Patient Active Problem List   Diagnosis Date Noted  . Infected dog bite 01/12/2012  . Nail avulsion of toe 07/22/2011  . Neck injury 04/20/2011  . ANXIETY 11/11/2008  . MENOPAUSAL SYNDROME 04/24/2008  . DEPRESSION 10/30/2007  . DYSPEPSIA 10/30/2007  . HYPERCHOLESTEROLEMIA, PURE 03/30/2007  . ICHTHYOSIS 03/29/2007  . MIGRAINE, CHRONIC 11/03/2006  . HYPERTENSION 11/03/2006  . IRRITABLE BOWEL SYNDROME 11/03/2006  . FIBROCYSTIC BREAST DISEASE 11/03/2006  . OSTEOARTHRITIS 11/03/2006  . POLYMYALGIA RHEUMATICA 11/03/2006  . ANA POSITIVE, HX OF 11/03/2006  . PERIPHERAL NEUROPATHY 12/21/2000  . COLITIS, ULCERATIVE NOS 08/23/1993   Past Medical History  Diagnosis Date  . Hypertension   . Arthritis     OA  . 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 Macular degeneration   . Anxiety   .  Depression   . Migraine     Hx of  . Cerumen impaction     recurrent Lt  . Peripheral neuropathy    Past Surgical History  Procedure Laterality Date  . Appendectomy    . Cholecystectomy    . Rhinoplasty    . Shoulder surgery      left shoulder replacement   History  Substance Use Topics  . Smoking status: Former Smoker    Quit date: 08/24/1983  . Smokeless tobacco: Never Used  . Alcohol Use: No   Family History  Problem Relation Age of Onset  . Stroke Mother   . Hypertension Mother   . Alcohol abuse Father   . Heart disease Father   . Kidney disease Father     dialysis  . Hypertension Brother   . Hypertension Daughter   . Cancer Maternal Aunt     colon CA  . Cancer Maternal Uncle     liver CA   Allergies  Allergen Reactions  . Ampicillin     REACTION: reaction not known  . Cefaclor     REACTION: diarrhea  . Codeine     REACTION: diarrhea  . Duloxetine     REACTION: severe diarrhea  . Fexofenadine     REACTION:  fuzzy  . Ibuprofen     REACTION: GI  . Loratadine     REACTION: blurred vision  . Nsaids     REACTION: fatigue   Current Outpatient Prescriptions on File Prior to Visit  Medication Sig Dispense Refill  . Calcium Carbonate-Vitamin D 600-400 MG-UNIT per tablet Take 1 tablet by mouth 2 (two) times daily.        . fluticasone (FLONASE) 50 MCG/ACT nasal spray Place 2 sprays into the nose daily.        Marland Kitchen lisinopril-hydrochlorothiazide (PRINZIDE,ZESTORETIC) 20-12.5 MG per tablet TAKE 1/2 TABLET BY MOUTH TWICE DAILY  90 tablet  0  . loperamide (IMODIUM A-D) 2 MG tablet Take 2 mg by mouth 4 (four) times daily as needed.        . Multiple Vitamin (MULTIVITAMIN) tablet Take 1 tablet by mouth daily.        . propranolol ER (INDERAL LA) 120 MG 24 hr capsule TAKE 1 CAPSULE BY MOUTH TWICE DAILY * Needs to schedule appointment with Dr for additional refills*  180 capsule  0  . ranitidine (ZANTAC) 150 MG capsule Take 150 mg by mouth 2 (two) times daily.        Marland Kitchen  ammonium lactate (LAC-HYDRIN) 12 % lotion Apply topically as needed.        . clotrimazole-betamethasone (LOTRISONE) cream Apply 1 application topically 2 (two) times daily. Apply small amount to corners of mouth twice daily for no more than 14 days.  30 g  1  . ibuprofen (ADVIL,MOTRIN) 200 MG tablet Take 600 mg by mouth daily.        . Ibuprofen-Diphenhydramine Cit (ADVIL PM) 200-38 MG TABS Take by mouth as directed.       Marland Kitchen PARoxetine (PAXIL) 20 MG tablet TAKE 1/2 TABLET BY MOUTH THREE TIMES DAILY  135 tablet  0   No current facility-administered medications on file prior to visit.      Review of Systems Review of Systems  Constitutional: Negative for fever, appetite change, fatigue and unexpected weight change.  Eyes: Negative for pain and visual disturbance.  Respiratory: Negative for cough and shortness of breath.   Cardiovascular: Negative for cp or palpitations    Gastrointestinal: Negative for nausea, diarrhea and constipation.  Genitourinary: Negative for urgency and frequency.  Skin: Negative for pallor or rash  pos for redness at site of dog bite/ pos for chronic extreme dryness with itching  Neurological: Negative for weakness, light-headedness, numbness and headaches.  Hematological: Negative for adenopathy. Does not bruise/bleed easily.  Psychiatric/Behavioral: Negative for dysphoric mood. The patient is not nervous/anxious.         Objective:   Physical Exam  Constitutional: She appears well-developed and well-nourished. No distress.  HENT:  Head: Normocephalic and atraumatic.  Right Ear: External ear normal.  Left Ear: External ear normal.  Nose: Nose normal.  Mouth/Throat: Oropharynx is clear and moist.  Scant cerumen  Eyes: Conjunctivae and EOM are normal. Pupils are equal, round, and reactive to light. Right eye exhibits no discharge. Left eye exhibits no discharge. No scleral icterus.  Neck: Normal range of motion. Neck supple. No JVD present. Carotid bruit is  not present. No thyromegaly present.  Cardiovascular: Normal rate, normal heart sounds and intact distal pulses.  Exam reveals no gallop.   Pulmonary/Chest: Effort normal and breath sounds normal. No respiratory distress. She has no wheezes. She has no rales.  Abdominal: Soft. Bowel sounds are normal. She exhibits no distension, no abdominal bruit and no  mass. There is no tenderness.  Musculoskeletal: She exhibits no edema and no tenderness.  Lymphadenopathy:    She has no cervical adenopathy.  Neurological: She is alert. She has normal reflexes. No cranial nerve deficit. She exhibits normal muscle tone. Coordination normal.  Skin: Skin is warm and dry. No rash noted. There is erythema. No pallor.  Small puncture wound on dorsal R hand above thumb  Nl circ/ sens and rom of hand and fingers No drainage Some erythema and scant swelling surrounding this  Mildly tender   Psychiatric: She has a normal mood and affect.          Assessment & Plan:

## 2012-12-27 NOTE — Assessment & Plan Note (Signed)
Pt states she is doing well -no longer on paxil Will continue to follow

## 2012-12-27 NOTE — Patient Instructions (Addendum)
Take care of yourself  Labs today  Take the augmentin as directed for dog bite - if any increase in swelling or redness please call (or fever or other symptoms) Stay active

## 2013-01-10 ENCOUNTER — Other Ambulatory Visit (INDEPENDENT_AMBULATORY_CARE_PROVIDER_SITE_OTHER): Payer: Medicare Other

## 2013-01-10 DIAGNOSIS — R799 Abnormal finding of blood chemistry, unspecified: Secondary | ICD-10-CM | POA: Diagnosis not present

## 2013-01-10 DIAGNOSIS — R7989 Other specified abnormal findings of blood chemistry: Secondary | ICD-10-CM

## 2013-01-10 LAB — BASIC METABOLIC PANEL
BUN: 23 mg/dL (ref 6–23)
CO2: 31 mEq/L (ref 19–32)
Calcium: 9.2 mg/dL (ref 8.4–10.5)
Creatinine, Ser: 1 mg/dL (ref 0.4–1.2)
GFR: 56.36 mL/min — ABNORMAL LOW (ref >60.00)
Glucose, Bld: 84 mg/dL (ref 70–99)
Sodium: 138 mEq/L (ref 135–145)

## 2013-01-11 ENCOUNTER — Encounter: Payer: Self-pay | Admitting: *Deleted

## 2013-01-11 ENCOUNTER — Other Ambulatory Visit: Payer: Self-pay | Admitting: Family Medicine

## 2013-01-22 ENCOUNTER — Other Ambulatory Visit: Payer: Self-pay | Admitting: Family Medicine

## 2013-01-22 NOTE — Telephone Encounter (Signed)
Pt doesn't think its a tick bite but will keep her appt for Wednesday, Rx sent to pharmacy

## 2013-01-22 NOTE — Telephone Encounter (Signed)
Called pt to see why she needs abx refilled and she advise me that she has a place on her leg that she thinks was a wart like the one that was on her finger but now she thinks it's infected. Pt said it has been red and swollen for a few days now and its painful, pt has an appt with you on 01/24/13 but doesn't want to wait that long to get abx in her system since it has been red, painful and swollen for a few day now, pt said there is a red ring around the place but it doesn't look like a bullseye. Please advise

## 2013-01-22 NOTE — Telephone Encounter (Signed)
Go ahead and refil times one If it is a tick bite - let me know - we would have her f/u and use different abx If pus or worse f/u

## 2013-01-24 ENCOUNTER — Encounter: Payer: Self-pay | Admitting: Family Medicine

## 2013-01-24 ENCOUNTER — Telehealth: Payer: Self-pay | Admitting: Family Medicine

## 2013-01-24 ENCOUNTER — Ambulatory Visit (INDEPENDENT_AMBULATORY_CARE_PROVIDER_SITE_OTHER): Payer: Medicare Other | Admitting: Family Medicine

## 2013-01-24 VITALS — BP 118/62 | HR 61 | Temp 98.3°F | Ht 63.5 in | Wt 136.8 lb

## 2013-01-24 DIAGNOSIS — Z79899 Other long term (current) drug therapy: Secondary | ICD-10-CM | POA: Diagnosis not present

## 2013-01-24 DIAGNOSIS — G609 Hereditary and idiopathic neuropathy, unspecified: Secondary | ICD-10-CM

## 2013-01-24 DIAGNOSIS — L989 Disorder of the skin and subcutaneous tissue, unspecified: Secondary | ICD-10-CM | POA: Diagnosis not present

## 2013-01-24 MED ORDER — MUPIROCIN 2 % EX OINT: TOPICAL_OINTMENT | Freq: Three times a day (TID) | CUTANEOUS | Status: DC

## 2013-01-24 NOTE — Telephone Encounter (Signed)
Pt returned to our office after her appt with Dr. Al Corpus today. Pt said that Dr. Al Corpus needs an xray of her back to make sure nothing is pinching any nerves, but he can't order xrays. Pt said Dr. Al Corpus would like you to order an xray and pt get it done here in our office, please advise

## 2013-01-24 NOTE — Assessment & Plan Note (Signed)
Looks to be a hyperkeratotic area (1 cm) with infection that is improving with abx tx  Will finish that  Also given bactroban ointment Will keep clean with soap and water and update if worse redness/ infection symptoms Will f/u with derm for eval - susp for poss squamous cell lesion

## 2013-01-24 NOTE — Telephone Encounter (Signed)
I will put order in chart for LS xrays - please call her to schedule a time

## 2013-01-24 NOTE — Patient Instructions (Addendum)
Finish the antibiotic - I think it is working well  Use the bactroban ointment several times daily  Keep clean with soap and water  Follow up with your dermatologist when this is healed to look at the lesion and possibly remove it  If you develop worse redness/ swelling/ pain or fever-please alert me

## 2013-01-24 NOTE — Progress Notes (Signed)
Subjective:    Patient ID: Suzanne Monroe, female    DOB: September 16, 1932, 79 y.o.   MRN: [id number]  HPI Here for problem on L leg Thought it was infected  Px antibiotic -and on it for 1 1/2 days  Some of the redness is improved Is sensitive  Not throbbing at night  Did not injure it  Seemed like a wart   Her dog bite is healed   Patient Active Problem List   Diagnosis Date Noted  . Dog bite of hand 12/27/2012  . Nail avulsion of toe 07/22/2011  . Neck injury 04/20/2011  . ANXIETY 11/11/2008  . MENOPAUSAL SYNDROME 04/24/2008  . DEPRESSION 10/30/2007  . DYSPEPSIA 10/30/2007  . HYPERCHOLESTEROLEMIA, PURE 03/30/2007  . ICHTHYOSIS 03/29/2007  . MIGRAINE, CHRONIC 11/03/2006  . HYPERTENSION 11/03/2006  . IRRITABLE BOWEL SYNDROME 11/03/2006  . FIBROCYSTIC BREAST DISEASE 11/03/2006  . OSTEOARTHRITIS 11/03/2006  . POLYMYALGIA RHEUMATICA 11/03/2006  . ANA POSITIVE, HX OF 11/03/2006  . PERIPHERAL NEUROPATHY 12/21/2000  . COLITIS, ULCERATIVE NOS 08/23/1993   Past Medical History  Diagnosis Date  . Hypertension   . Arthritis     OA  . 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 Macular degeneration   . Anxiety   . Depression   . Migraine     Hx of  . Cerumen impaction     recurrent Lt  . Peripheral neuropathy    Past Surgical History  Procedure Laterality Date  . Appendectomy    . Cholecystectomy    . Rhinoplasty    . Shoulder surgery      left shoulder replacement   History  Substance Use Topics  . Smoking status: Former Smoker    Quit date: 08/24/1983  . Smokeless tobacco: Never Used  . Alcohol Use: No   Family History  Problem Relation Age of Onset  . Stroke Mother   . Hypertension Mother   . Alcohol abuse Father   . Heart disease Father   . Kidney disease Father     dialysis  . Hypertension Brother   . Hypertension Daughter   . Cancer Maternal Aunt     colon CA  . Cancer Maternal Uncle     liver CA   Allergies   Allergen Reactions  . Ampicillin     REACTION: reaction not known  . Cefaclor     REACTION: diarrhea  . Codeine     REACTION: diarrhea  . Duloxetine     REACTION: severe diarrhea  . Fexofenadine     REACTION: fuzzy  . Ibuprofen     REACTION: GI  . Loratadine     REACTION: blurred vision  . Nsaids     REACTION: fatigue   Current Outpatient Prescriptions on File Prior to Visit  Medication Sig Dispense Refill  . ammonium lactate (LAC-HYDRIN) 12 % lotion Apply topically as needed.        Marland Kitchen amoxicillin-clavulanate (AUGMENTIN) 875-125 MG per tablet TAKE 1 TABLET BY MOUTH 2 (TWO) TIMES DAILY.  14 tablet  0  . Calcium Carbonate-Vitamin D 600-400 MG-UNIT per tablet Take 1 tablet by mouth 2 (two) times daily.        . clotrimazole-betamethasone (LOTRISONE) cream Apply 1 application topically 2 (two) times daily. Apply small amount to corners of mouth twice daily for no more than 14 days.  30 g  1  . gabapentin (NEURONTIN) 300 MG capsule Take by mouth. 1 TAB IN AM AND  2 TAB AT BEDTIME      . lisinopril-hydrochlorothiazide (PRINZIDE,ZESTORETIC) 20-12.5 MG per tablet TAKE 1/2 TABLET BY MOUTH TWICE DAILY  90 tablet  3  . loperamide (IMODIUM A-D) 2 MG tablet Take 2 mg by mouth 4 (four) times daily as needed.        . Multiple Vitamin (MULTIVITAMIN) tablet Take 1 tablet by mouth daily.        . propranolol ER (INDERAL LA) 120 MG 24 hr capsule 1 pill by mouth twice daily  180 capsule  3  . ranitidine (ZANTAC) 150 MG capsule Take 150 mg by mouth 2 (two) times daily.        . fluticasone (FLONASE) 50 MCG/ACT nasal spray Place 2 sprays into the nose daily.  16 g  5   No current facility-administered medications on file prior to visit.      Review of Systems Review of Systems  Constitutional: Negative for fever, appetite change, fatigue and unexpected weight change.  Eyes: Negative for pain and visual disturbance.  Respiratory: Negative for cough and shortness of breath.   Cardiovascular:  Negative for cp or palpitations    Gastrointestinal: Negative for nausea, diarrhea and constipation.  Genitourinary: Negative for urgency and frequency.  Skin: Negative for pallor or rash  pos for skin lesion with redness/ pos for very dry skin Neurological: Negative for weakness, light-headedness, numbness and headaches.  Hematological: Negative for adenopathy. Does not bruise/bleed easily.  Psychiatric/Behavioral: Negative for dysphoric mood. The patient is not nervous/anxious.         Objective:   Physical Exam  Constitutional: She appears well-developed and well-nourished. No distress.  HENT:  Head: Normocephalic and atraumatic.  Eyes: Conjunctivae and EOM are normal. Pupils are equal, round, and reactive to light.  Neck: Normal range of motion. Neck supple. Carotid bruit is not present. No thyromegaly present.  Cardiovascular: Normal rate and regular rhythm.   Pulmonary/Chest: Effort normal and breath sounds normal.  Lymphadenopathy:    She has no cervical adenopathy.  Neurological: She is alert.  Skin: Skin is warm and dry. No rash noted. There is erythema. No pallor.  1 cm raised keratotic area on back of L lower leg that has 1 cm of surrounding redness No drainage Mildly tender  Overall very dry skin with lentigos  Psychiatric: She has a normal mood and affect.          Assessment & Plan:

## 2013-01-25 NOTE — Telephone Encounter (Signed)
Pt notified xray orders are in, she doesn't need an appt

## 2013-01-30 ENCOUNTER — Telehealth: Payer: Self-pay | Admitting: *Deleted

## 2013-01-30 DIAGNOSIS — L989 Disorder of the skin and subcutaneous tissue, unspecified: Secondary | ICD-10-CM

## 2013-01-30 NOTE — Telephone Encounter (Signed)
Pt said the placed you check on her leg is still red and looks infected pt has finished her antibiotic and it hasn't changed. Pt doesn't want to have another round of antibiotics she would like you to put a referral in for a new dermatologist to have it check out, pt's dermatologist she goes to now can't see her until Aug. and pt doesn't want to wait that long to have it check.   Pt advise if you are okay with putting in a referral for a new dermatologist Shirlee Limerick will call her tomorrow to set up appt if not I will call back tomorrow with your recommendations

## 2013-01-30 NOTE — Telephone Encounter (Signed)
Will do referral to derm

## 2013-01-31 ENCOUNTER — Ambulatory Visit: Payer: Self-pay | Admitting: Family Medicine

## 2013-01-31 DIAGNOSIS — Z1231 Encounter for screening mammogram for malignant neoplasm of breast: Secondary | ICD-10-CM | POA: Diagnosis not present

## 2013-02-01 ENCOUNTER — Encounter: Payer: Self-pay | Admitting: Family Medicine

## 2013-02-02 ENCOUNTER — Encounter: Payer: Self-pay | Admitting: *Deleted

## 2013-02-05 ENCOUNTER — Ambulatory Visit (INDEPENDENT_AMBULATORY_CARE_PROVIDER_SITE_OTHER)
Admission: RE | Admit: 2013-02-05 | Discharge: 2013-02-05 | Disposition: A | Discharge status: Home / Self Care | Payer: Medicare Other | Source: Ambulatory Visit | Attending: Family Medicine | Admitting: Family Medicine

## 2013-02-05 DIAGNOSIS — G609 Hereditary and idiopathic neuropathy, unspecified: Secondary | ICD-10-CM | POA: Diagnosis not present

## 2013-02-05 DIAGNOSIS — M5137 Other intervertebral disc degeneration, lumbosacral region: Secondary | ICD-10-CM | POA: Diagnosis not present

## 2013-02-05 DIAGNOSIS — M4310 Spondylolisthesis, site unspecified: Secondary | ICD-10-CM | POA: Diagnosis not present

## 2013-02-21 DIAGNOSIS — L821 Other seborrheic keratosis: Secondary | ICD-10-CM | POA: Diagnosis not present

## 2013-02-21 DIAGNOSIS — L819 Disorder of pigmentation, unspecified: Secondary | ICD-10-CM | POA: Diagnosis not present

## 2013-02-21 DIAGNOSIS — L7211 Pilar cyst: Secondary | ICD-10-CM | POA: Diagnosis not present

## 2013-02-21 DIAGNOSIS — C44721 Squamous cell carcinoma of skin of unspecified lower limb, including hip: Secondary | ICD-10-CM | POA: Diagnosis not present

## 2013-02-21 DIAGNOSIS — Z0189 Encounter for other specified special examinations: Secondary | ICD-10-CM | POA: Diagnosis not present

## 2013-02-28 DIAGNOSIS — Z79899 Other long term (current) drug therapy: Secondary | ICD-10-CM | POA: Diagnosis not present

## 2013-03-21 ENCOUNTER — Other Ambulatory Visit: Payer: Self-pay | Admitting: Family Medicine

## 2013-03-21 NOTE — Telephone Encounter (Signed)
Please refill for a year thanks 

## 2013-03-21 NOTE — Telephone Encounter (Signed)
done

## 2013-03-21 NOTE — Telephone Encounter (Signed)
Electronic refill request, please advise  

## 2013-03-27 DIAGNOSIS — C44711 Basal cell carcinoma of skin of unspecified lower limb, including hip: Secondary | ICD-10-CM | POA: Diagnosis not present

## 2013-03-27 DIAGNOSIS — L7212 Trichodermal cyst: Secondary | ICD-10-CM | POA: Diagnosis not present

## 2013-04-17 ENCOUNTER — Telehealth: Payer: Self-pay | Admitting: Family Medicine

## 2013-04-17 NOTE — Telephone Encounter (Signed)
Suzanne Monroe dropped off a independent living form to be filled out.

## 2013-04-17 NOTE — Telephone Encounter (Signed)
Form placed in your inbox

## 2013-04-17 NOTE — Telephone Encounter (Signed)
Done and in IN box 

## 2013-04-18 DIAGNOSIS — Z0279 Encounter for issue of other medical certificate: Secondary | ICD-10-CM

## 2013-04-18 NOTE — Telephone Encounter (Signed)
Form mailed and pt notified and advised of the $20 fee

## 2013-04-19 DIAGNOSIS — H40009 Preglaucoma, unspecified, unspecified eye: Secondary | ICD-10-CM | POA: Diagnosis not present

## 2013-04-27 ENCOUNTER — Telehealth: Payer: Self-pay

## 2013-04-27 NOTE — Telephone Encounter (Signed)
Pt has had diarrhea twice a day x 1 week;  pt states she feels very weak. No abd pain, no fever,dizziness, no bleeding and no vomiting. Pt is drinking fluids and does not feel dehydrated, no dry mouth and voiding normally. [facility]. Pt feels little better today, just concerned diarrhea and weakness has gone on for 1 week. Pt request cb. Advised pt if condition changes or worsens can go to UC or Sat Clinic.

## 2013-04-27 NOTE — Telephone Encounter (Signed)
Needs f/u- given the weakness- pref Sat clinic - may not be a good idea to wait until Monday

## 2013-04-27 NOTE — Telephone Encounter (Signed)
Called pt and no answer and no voicemail  

## 2013-04-30 ENCOUNTER — Telehealth: Payer: Self-pay

## 2013-04-30 NOTE — Telephone Encounter (Signed)
[name] pts daughter living in [facility] called with concerns of pt living alone. [name] said her sister in [place] went to ck on pt on 04/28/13. Health concern that pts 2 cats and dog are not going outside to have BM or urinate; house smells terrible. Pt is forgetting to pay bills and has gotten lost while driving. Pt has neuropathy in feet and concerned about pt driving a car. Pts daughters wants pt to be placed in a facility near  the daughter that lives in [place]. Pt does not want to leave her home.Pt told her daughter she would rather die than leave her house. [name] wants direction in what she can do for her mother. [name] does not want pt to know that she has called. Gaylord Shih that makes it very difficult. There is also no DPR signed to talk with daughter. Sarah request cb.

## 2013-04-30 NOTE — Telephone Encounter (Signed)
appt scheduled for 05/01/13

## 2013-04-30 NOTE — Telephone Encounter (Signed)
Those concerns are understandable - since I do not have permission to speak with her about pt's medical hx -the best strategy is to have her come in for a visit with the patient so we can all talk

## 2013-05-01 ENCOUNTER — Encounter: Payer: Self-pay | Admitting: Family Medicine

## 2013-05-01 ENCOUNTER — Ambulatory Visit (INDEPENDENT_AMBULATORY_CARE_PROVIDER_SITE_OTHER): Payer: Medicare Other | Admitting: Family Medicine

## 2013-05-01 VITALS — BP 144/80 | HR 81 | Temp 98.4°F | Ht 63.5 in | Wt 134.0 lb

## 2013-05-01 DIAGNOSIS — F430 Acute stress reaction: Secondary | ICD-10-CM | POA: Diagnosis not present

## 2013-05-01 DIAGNOSIS — K589 Irritable bowel syndrome without diarrhea: Secondary | ICD-10-CM

## 2013-05-01 DIAGNOSIS — R197 Diarrhea, unspecified: Secondary | ICD-10-CM | POA: Diagnosis not present

## 2013-05-01 LAB — CBC WITH DIFFERENTIAL/PLATELET
Eosinophils Relative: 1.5 % (ref 0.0–5.0)
HCT: 38.3 % (ref 36.0–46.0)
Hemoglobin: 12.7 g/dL (ref 12.0–15.0)
Lymphs Abs: 2.7 10*3/uL (ref 0.7–4.0)
MCV: 90.9 fl (ref 78.0–100.0)
Monocytes Absolute: 0.9 10*3/uL (ref 0.1–1.0)
Neutro Abs: 6.7 10*3/uL (ref 1.4–7.7)
Platelets: 290 10*3/uL (ref 150.0–400.0)
WBC: 10.5 10*3/uL (ref 4.5–10.5)

## 2013-05-01 LAB — COMPREHENSIVE METABOLIC PANEL
Alkaline Phosphatase: 70 U/L (ref 39–117)
BUN: 12 mg/dL (ref 6–23)
CO2: 29 mEq/L (ref 19–32)
Creatinine, Ser: 0.9 mg/dL (ref 0.4–1.2)
GFR: 67.8 mL/min (ref >60.00)
Glucose, Bld: 134 mg/dL — ABNORMAL HIGH (ref 70–99)
Sodium: 141 mEq/L (ref 135–145)
Total Bilirubin: 0.5 mg/dL (ref 0.3–1.2)
Total Protein: 6.7 g/dL (ref 6.0–8.3)

## 2013-05-01 LAB — TSH: TSH: 0.28 u[IU]/mL — ABNORMAL LOW (ref 0.35–5.50)

## 2013-05-01 NOTE — Telephone Encounter (Signed)
Pt's daughter notified of Dr. Royden Purl comments and they will try to come to an appt with her to discuss problems, pt has an appt today and daughter request that we ask pt if she wants to update her DPR and add her daughter's so we can discuss pt's problems with them

## 2013-05-01 NOTE — Telephone Encounter (Signed)
Pt signed DPR form giving [place] permission to speak with both daughters, [name] would like a call from Dr. Milinda Antis just to talk to Dr. Milinda Antis abut her concerns ph # [phone]

## 2013-05-01 NOTE — Patient Instructions (Addendum)
Please send for last colonoscopy report (I think it was from 2005)  Labs for diarrhea  Get me your daughter's phone number when you can  Gradually advance your diet (bland at first) and keep hydrated

## 2013-05-01 NOTE — Progress Notes (Signed)
Subjective:    Patient ID: Suzanne Monroe, female    DOB: 12/28/1932, 79 y.o.   MRN: [id number]  HPI Here with GI problem  Day 10 of symptoms  Diarrhea -- loose stool twice daily    (improved the past 2 days - with less eating and more soups and liquid intake) No blood in stool and no dark stool No abd pain or cramping  One episode of fecal incontinence  Thinks she may have had a virus   She had abx for a dental procedure about 3-4 wk ago    ? What she ate before this happened  Eats a lot of frozen meals that she microwaves  Also eats out - carry out  No change and nothing special   Dairy occ bothers her   No vomiting or nausea or vomiting   Has a hx of IBS UC is in her chart- but she thinks this is not right  Her usual IBS is diarrhea predominant - and that bothers her 3-4 times per week -but not this loose   She has had a little stress  Has 2 daughters - one in [place] (nurse) One in [place]  They are very concerned about her in general   They note that she : Gets angry quicker/ more irritable  They worry that she is depressed     (she lost a good friend Enos Fling this week) They worry that she is alone - and also falls and memory Her memory is not as good as it was- but she does not think it is a big problem She does not want to leave her home  Patient Active Problem List   Diagnosis Date Noted  . Leg skin lesion, left 01/24/2013  . Nail avulsion of toe 07/22/2011  . Neck injury 04/20/2011  . ANXIETY 11/11/2008  . MENOPAUSAL SYNDROME 04/24/2008  . DEPRESSION 10/30/2007  . DYSPEPSIA 10/30/2007  . HYPERCHOLESTEROLEMIA, PURE 03/30/2007  . ICHTHYOSIS 03/29/2007  . MIGRAINE, CHRONIC 11/03/2006  . HYPERTENSION 11/03/2006  . IRRITABLE BOWEL SYNDROME 11/03/2006  . FIBROCYSTIC BREAST DISEASE 11/03/2006  . OSTEOARTHRITIS 11/03/2006  . POLYMYALGIA RHEUMATICA 11/03/2006  . ANA POSITIVE, HX OF 11/03/2006  . PERIPHERAL NEUROPATHY 12/21/2000  . COLITIS, ULCERATIVE NOS  08/23/1993   Past Medical History  Diagnosis Date  . Hypertension   . Arthritis     OA  . 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 Macular degeneration   . Anxiety   . Depression   . Migraine     Hx of  . Cerumen impaction     recurrent Lt  . Peripheral neuropathy    Past Surgical History  Procedure Laterality Date  . Appendectomy    . Cholecystectomy    . Rhinoplasty    . Shoulder surgery      left shoulder replacement   History  Substance Use Topics  . Smoking status: Former Smoker    Quit date: 08/24/1983  . Smokeless tobacco: Never Used  . Alcohol Use: No   Family History  Problem Relation Age of Onset  . Stroke Mother   . Hypertension Mother   . Alcohol abuse Father   . Heart disease Father   . Kidney disease Father     dialysis  . Hypertension Brother   . Hypertension Daughter   . Cancer Maternal Aunt     colon CA  . Cancer Maternal Uncle     liver CA  Allergies  Allergen Reactions  . Ampicillin     REACTION: reaction not known  . Cefaclor     REACTION: diarrhea  . Codeine     REACTION: diarrhea  . Duloxetine     REACTION: severe diarrhea  . Fexofenadine     REACTION: fuzzy  . Ibuprofen     REACTION: GI  . Loratadine     REACTION: blurred vision  . Nsaids     REACTION: fatigue   Current Outpatient Prescriptions on File Prior to Visit  Medication Sig Dispense Refill  . ammonium lactate (LAC-HYDRIN) 12 % lotion Apply topically as needed.        . Calcium Carbonate-Vitamin D 600-400 MG-UNIT per tablet Take 1 tablet by mouth 2 (two) times daily.        . clotrimazole-betamethasone (LOTRISONE) cream Apply 1 application topically 2 (two) times daily. Apply small amount to corners of mouth twice daily for no more than 14 days.  30 g  1  . fluticasone (FLONASE) 50 MCG/ACT nasal spray Place 2 sprays into the nose daily.  16 g  5  . gabapentin (NEURONTIN) 300 MG capsule Take by mouth. 1 TAB IN AM AND 2 TAB AT  BEDTIME      . lisinopril-hydrochlorothiazide (PRINZIDE,ZESTORETIC) 20-12.5 MG per tablet TAKE 1/2 TABLET BY MOUTH TWICE DAILY  90 tablet  3  . loperamide (IMODIUM A-D) 2 MG tablet Take 2 mg by mouth 4 (four) times daily as needed.        . Multiple Vitamin (MULTIVITAMIN) tablet Take 1 tablet by mouth daily.        . mupirocin ointment (BACTROBAN) 2 % Apply topically 3 (three) times daily. To affected area  22 g  0  . PARoxetine (PAXIL) 20 MG tablet TAKE 1/2 TABLET BY MOUTH THREE TIMES DAILY  135 tablet  3  . propranolol ER (INDERAL LA) 120 MG 24 hr capsule 1 pill by mouth twice daily  180 capsule  3  . ranitidine (ZANTAC) 150 MG capsule Take 150 mg by mouth 2 (two) times daily.         No current facility-administered medications on file prior to visit.      Review of Systems Review of Systems  Constitutional: Negative for fever, appetite change, fatigue and unexpected weight change.  Eyes: Negative for pain and visual disturbance.  Respiratory: Negative for cough and shortness of breath.   Cardiovascular: Negative for cp or palpitations    Gastrointestinal: Negative for nausea, vomiting or abd pain , pos for frequent loose stools  Genitourinary: Negative for urgency and frequency.  Skin: Negative for pallor or rash   Neurological: Negative for weakness, light-headedness, numbness and headaches.  Hematological: Negative for adenopathy. Does not bruise/bleed easily.  Psychiatric/Behavioral: pos for grief/ neg for recent anxiety/ pos for stressors       Objective:   Physical Exam  Constitutional: She appears well-developed and well-nourished. No distress.  HENT:  Head: Normocephalic and atraumatic.  Mouth/Throat: Oropharynx is clear and moist.  Eyes: Conjunctivae and EOM are normal. Pupils are equal, round, and reactive to light. Right eye exhibits no discharge. Left eye exhibits no discharge. No scleral icterus.  Neck: Normal range of motion. Neck supple. No JVD present. Carotid  bruit is not present. No thyromegaly present.  Cardiovascular: Normal rate and regular rhythm.   Pulmonary/Chest: Effort normal and breath sounds normal.  Abdominal: She exhibits no abdominal bruit. There is no hepatosplenomegaly. There is no tenderness. There is no rebound,  no guarding, no CVA tenderness, no tenderness at McBurney's point and negative Murphy's sign.  Musculoskeletal: She exhibits no edema and no tenderness.  Lymphadenopathy:    She has no cervical adenopathy.  Neurological: She is alert. She has normal reflexes.  Skin: Skin is warm and dry.  Dry baseline  Psychiatric: She has a normal mood and affect.  Attentive and mentally clear today Not tearful          Assessment & Plan:

## 2013-05-01 NOTE — Telephone Encounter (Signed)
Left voicemail requesting [name] to call office back

## 2013-05-02 DIAGNOSIS — R197 Diarrhea, unspecified: Secondary | ICD-10-CM | POA: Diagnosis not present

## 2013-05-02 NOTE — Telephone Encounter (Signed)
Pt's daughter [name] left voicemail: Daughter wanted to see if Dr. Milinda Antis could call her to discuss her concern's about her mother best # to reach [name] is [phone], or her work # [phone], but can leave voicemail on either #

## 2013-05-03 ENCOUNTER — Telehealth: Payer: Self-pay

## 2013-05-03 ENCOUNTER — Other Ambulatory Visit: Payer: Self-pay | Admitting: *Deleted

## 2013-05-03 LAB — CLOSTRIDIUM DIFFICILE EIA: CDIFTX: NEGATIVE

## 2013-05-03 MED ORDER — POTASSIUM CHLORIDE ER 10 MEQ PO TBCR: 10.0000 meq | EXTENDED_RELEASE_TABLET | Freq: Every day | ORAL | Status: DC

## 2013-05-03 NOTE — Telephone Encounter (Signed)
Pt was seen on 05/01/13; pt said discussed with Dr Milinda Antis about her memory; pt wants to schedule memory test.Please advise. Pt request cb.

## 2013-05-03 NOTE — Telephone Encounter (Signed)
F/u appt scheduled for Monday 05/07/13, I advise pt's daughter [name] of appt. Pt's daughter [name] that lives in [place] will not be able to come to appt due to starting new job but [name] would like a call after appt to let her know what your opinion is regarding pt's memory and if it's bad enough for [name] to have to come down from [place] to make some family decisions

## 2013-05-03 NOTE — Assessment & Plan Note (Signed)
Suspect IBS Stool studies and lab today to r/o infx causes or electrolyte abn

## 2013-05-03 NOTE — Telephone Encounter (Signed)
Thanks will do

## 2013-05-03 NOTE — Assessment & Plan Note (Signed)
Disc situation with her daughters Sounds like they are worried for her healthy and safety/ mental health/ memory Given permission to speak with her daughter in [place] about this  Would return for visit for MMS exam if necessary  Pt does admit to recent grief

## 2013-05-03 NOTE — Telephone Encounter (Signed)
Let her know I talked to her daughter yesterday evening and want her to f/u for that  preferrably if her daughter from [place] could come -that would be optimal for that visit  Schedule f/u 30 min when able

## 2013-05-03 NOTE — Assessment & Plan Note (Signed)
Suspect this may be cause of her diarrhea given stressors  Lab today to be sure as well as stool studies

## 2013-05-04 NOTE — Telephone Encounter (Signed)
I spoke to her daughter in [place] with permission  She disc problems with her mother -including easy confusability- cannot organize or manage her finances well, has trouble following conversations esp in a group, and has been late / become lost several times (in an effort to get her into a retirement comm called [place] near her other daughter in [place]) Also does not let pets out when they need to go -leading to lots of accidents and an un hygenic house  She is paying enormous vet bills taking care of an elderly dog-doubts her judgement about that  She worries about driving skills with periph neuropathy  Also reminds me that she always worries about depression-pt was tx with ECT several time in her 55s for major depression   I told her we will request f/u for pt (if poss to bring her other daughter) to disc these issues and do MMS exam and further work up

## 2013-05-07 ENCOUNTER — Encounter: Payer: Self-pay | Admitting: Family Medicine

## 2013-05-07 ENCOUNTER — Ambulatory Visit (INDEPENDENT_AMBULATORY_CARE_PROVIDER_SITE_OTHER): Payer: Medicare Other | Admitting: Family Medicine

## 2013-05-07 VITALS — BP 134/74 | HR 56 | Temp 98.1°F | Ht 63.5 in | Wt 135.2 lb

## 2013-05-07 DIAGNOSIS — E876 Hypokalemia: Secondary | ICD-10-CM | POA: Diagnosis not present

## 2013-05-07 DIAGNOSIS — R946 Abnormal results of thyroid function studies: Secondary | ICD-10-CM

## 2013-05-07 DIAGNOSIS — R413 Other amnesia: Secondary | ICD-10-CM | POA: Diagnosis not present

## 2013-05-07 DIAGNOSIS — Z9181 History of falling: Secondary | ICD-10-CM | POA: Diagnosis not present

## 2013-05-07 DIAGNOSIS — R7989 Other specified abnormal findings of blood chemistry: Secondary | ICD-10-CM

## 2013-05-07 DIAGNOSIS — Z23 Encounter for immunization: Secondary | ICD-10-CM

## 2013-05-07 DIAGNOSIS — R296 Repeated falls: Secondary | ICD-10-CM | POA: Insufficient documentation

## 2013-05-07 NOTE — Patient Instructions (Addendum)
Labs today  We did your memory screen today  I will talk to your daughter You need to pow wow with both of them and make a plan for what will happen next  I worry about falls with you - ? Perhaps in the future we should consider some physical therapy for balance or use of a walker when needed (also - pick up all rugs)

## 2013-05-07 NOTE — Assessment & Plan Note (Signed)
Re check today with free T4  

## 2013-05-07 NOTE — Assessment & Plan Note (Signed)
K re check today Supplemented after bout with diarrhea

## 2013-05-07 NOTE — Assessment & Plan Note (Signed)
Reassuring interview today and MMS exam  Disc concerns of her family re: caring for self and finances  She may be open to moving closer to her daughter  She blames recent late bills and inadequate pet care to her GI illness  Enc her strongly to make a plan with her daughters

## 2013-05-07 NOTE — Progress Notes (Signed)
Subjective:    Patient ID: Suzanne Monroe, female    DOB: 04/14/1933, 79 y.o.   MRN: [id number]  HPI Here to discuss memory and also f/u for diarrhea and low tsh   Her stool studies and labs were overall ok  In retrospect she thinks she had a virus K was low - supplemented that  Starting to get strength   She tends to fall a lot  Usually trips on something Has hit her head in the past  In general balance is not as good as it used to be   MMS today High score 29/30 with a fair clock draw   She admits that she has a hard time remembering the day of the week at times  occ walks into a room and forgets why  Concentration is not as good as it used to be  [employer] crossword puzzles every day - and that is helpful  Short term memory is not as good as long term     Disc conversation I had with her daughter [name] out of state She had the following concerns : They have noticed a big change in the past 6 mo or so   Finances-- pt states that she was sick to write checks and now she is better- states she is generally not behind  Her account was low when she called in a check this am (mail is really stacked up) Ability to follow conversations -pt does not remember an occasion (does not think she has a hearing problem) Getting lost -(going to unfamiliar places)  Staying on time-going to the same place  Depression -pt states she does not feel depressed and enjoys taking care of her house , has had a lot of loss lately and grief (devistated when her first husband died- and has a lot of guilt) Driving-she does not feel like the neuropathy affects this and she has never had an accident   Animal care - states her animal is trained to use paper- and her dog has gone blind - has a lot of accidents   She said she would like to move out closer to her daughter [name] in [place]  She is battling with fibromyalgia - and on lyrica and no longer on gabapentin  Feels fuzzy in the brain from that    Patient  Active Problem List   Diagnosis Date Noted  . Low TSH level 05/07/2013  . Memory loss 05/07/2013  . Hypokalemia 05/07/2013  . Diarrhea 05/01/2013  . Stress reaction 05/01/2013  . Leg skin lesion, left 01/24/2013  . Nail avulsion of toe 07/22/2011  . Neck injury 04/20/2011  . ANXIETY 11/11/2008  . MENOPAUSAL SYNDROME 04/24/2008  . DEPRESSION 10/30/2007  . DYSPEPSIA 10/30/2007  . HYPERCHOLESTEROLEMIA, PURE 03/30/2007  . ICHTHYOSIS 03/29/2007  . MIGRAINE, CHRONIC 11/03/2006  . HYPERTENSION 11/03/2006  . IRRITABLE BOWEL SYNDROME 11/03/2006  . FIBROCYSTIC BREAST DISEASE 11/03/2006  . OSTEOARTHRITIS 11/03/2006  . POLYMYALGIA RHEUMATICA 11/03/2006  . ANA POSITIVE, HX OF 11/03/2006  . PERIPHERAL NEUROPATHY 12/21/2000  . COLITIS, ULCERATIVE NOS 08/23/1993   Past Medical History  Diagnosis Date  . Hypertension   . Arthritis     OA  . 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 Macular degeneration   . Anxiety   . Depression   . Migraine     Hx of  . Cerumen impaction     recurrent Lt  . Peripheral neuropathy  Past Surgical History  Procedure Laterality Date  . Appendectomy    . Cholecystectomy    . Rhinoplasty    . Shoulder surgery      left shoulder replacement   History  Substance Use Topics  . Smoking status: Former Smoker    Quit date: 08/24/1983  . Smokeless tobacco: Never Used  . Alcohol Use: No   Family History  Problem Relation Age of Onset  . Stroke Mother   . Hypertension Mother   . Alcohol abuse Father   . Heart disease Father   . Kidney disease Father     dialysis  . Hypertension Brother   . Hypertension Daughter   . Cancer Maternal Aunt     colon CA  . Cancer Maternal Uncle     liver CA   Allergies  Allergen Reactions  . Ampicillin     REACTION: reaction not known  . Cefaclor     REACTION: diarrhea  . Codeine     REACTION: diarrhea  . Duloxetine     REACTION: severe diarrhea  . Fexofenadine      REACTION: fuzzy  . Ibuprofen     REACTION: GI  . Loratadine     REACTION: blurred vision  . Nsaids     REACTION: fatigue   Current Outpatient Prescriptions on File Prior to Visit  Medication Sig Dispense Refill  . ammonium lactate (LAC-HYDRIN) 12 % lotion Apply topically as needed.        . Calcium Carbonate-Vitamin D 600-400 MG-UNIT per tablet Take 1 tablet by mouth 2 (two) times daily.        . clotrimazole-betamethasone (LOTRISONE) cream Apply 1 application topically 2 (two) times daily. Apply small amount to corners of mouth twice daily for no more than 14 days.  30 g  1  . fluticasone (FLONASE) 50 MCG/ACT nasal spray Place 2 sprays into the nose daily.  16 g  5  . gabapentin (NEURONTIN) 300 MG capsule Take by mouth. 1 TAB IN AM AND 2 TAB AT BEDTIME      . lisinopril-hydrochlorothiazide (PRINZIDE,ZESTORETIC) 20-12.5 MG per tablet TAKE 1/2 TABLET BY MOUTH TWICE DAILY  90 tablet  3  . loperamide (IMODIUM A-D) 2 MG tablet Take 2 mg by mouth 4 (four) times daily as needed.        Marland Kitchen LYRICA 75 MG capsule Take 1 capsule by mouth 2 (two) times daily.      . Multiple Vitamin (MULTIVITAMIN) tablet Take 1 tablet by mouth daily.        . mupirocin ointment (BACTROBAN) 2 % Apply topically 3 (three) times daily. To affected area  22 g  0  . PARoxetine (PAXIL) 20 MG tablet TAKE 1/2 TABLET BY MOUTH THREE TIMES DAILY  135 tablet  3  . potassium chloride (K-DUR) 10 MEQ tablet Take 1 tablet (10 mEq total) by mouth daily.  30 tablet  0  . propranolol ER (INDERAL LA) 120 MG 24 hr capsule 1 pill by mouth twice daily  180 capsule  3  . ranitidine (ZANTAC) 150 MG capsule Take 150 mg by mouth 2 (two) times daily.         No current facility-administered medications on file prior to visit.    Review of Systems Review of Systems  Constitutional: Negative for fever, appetite change, and unexpected weight change. pos for fatigue  Eyes: Negative for pain and visual disturbance.  Respiratory: Negative for cough  and shortness of breath.   Cardiovascular: Negative for  cp or palpitations    Gastrointestinal: Negative for nausea, diarrhea and constipation.  Genitourinary: Negative for urgency and frequency.  Skin: Negative for pallor or rash   MSK pos for fibromyalgia myofasical pain  Neurological: Negative for weakness, light-headedness, numbness and headaches. pos for falls  Hematological: Negative for adenopathy. Does not bruise/bleed easily.  Psychiatric/Behavioral: Negative for dysphoric mood. The patient is not nervous/anxious.  pos for recent grief        Objective:   Physical Exam  Constitutional: She is oriented to person, place, and time. She appears well-developed and well-nourished. No distress.  Well appearing   HENT:  Head: Normocephalic and atraumatic.  Eyes: Conjunctivae and EOM are normal. Pupils are equal, round, and reactive to light. No scleral icterus.  Neurological: She is alert and oriented to person, place, and time. She has normal reflexes. She exhibits normal muscle tone. Coordination normal.  Skin: Skin is warm and dry. No rash noted. No erythema. No pallor.  Psychiatric: She has a normal mood and affect. Her behavior is normal. Thought content normal. Her mood appears not anxious. Her affect is not blunt and not labile. Her speech is not rapid and/or pressured and not delayed. Thought content is not paranoid. Cognition and memory are not impaired. She does not exhibit a depressed mood. She expresses no homicidal and no suicidal ideation.  Overall in good mood and mentally sharp today  She becomes almost tearful when discussing grief   MMS score is 29/30 Clock draw is nl except for crowding hands into the corner of clock          Assessment & Plan:

## 2013-05-07 NOTE — Assessment & Plan Note (Signed)
This is worrisome - disc in detail loss of balance with age Also side eff of lyrica-sedation  Rev fall precaution- taking up rugs and also using walker if needed Her small dog is a fall risk in itself as well May consider ref to PT for vestibular training

## 2013-05-08 LAB — SEDIMENTATION RATE: Sed Rate: 24 mm/hr — ABNORMAL HIGH (ref 0–22)

## 2013-05-08 LAB — VITAMIN B12: Vitamin B-12: 1204 pg/mL — ABNORMAL HIGH (ref 211–911)

## 2013-05-08 LAB — T4, FREE: Free T4: 0.89 ng/dL (ref 0.60–1.60)

## 2013-05-15 ENCOUNTER — Telehealth: Payer: Self-pay | Admitting: Family Medicine

## 2013-05-15 DIAGNOSIS — M353 Polymyalgia rheumatica: Secondary | ICD-10-CM

## 2013-05-15 DIAGNOSIS — G609 Hereditary and idiopathic neuropathy, unspecified: Secondary | ICD-10-CM

## 2013-05-15 DIAGNOSIS — M797 Fibromyalgia: Secondary | ICD-10-CM | POA: Insufficient documentation

## 2013-05-15 DIAGNOSIS — Z862 Personal history of diseases of the blood and blood-forming organs and certain disorders involving the immune mechanism: Secondary | ICD-10-CM

## 2013-05-15 NOTE — Telephone Encounter (Signed)
Patient called and wants to be referred to [facility] for her polymyalgia and arthritis. They need a referral and notes in order to schedule her an appt. Records must be faxed to Fax# [phone]. She called first to ask me who could she see for fibromyalgia and I told her that no Rheumatologist in our area will see a patient for that dx. She wants the referral for the other two dx's.

## 2013-05-15 NOTE — Telephone Encounter (Signed)
I did the referral 

## 2013-05-22 DIAGNOSIS — C44721 Squamous cell carcinoma of skin of unspecified lower limb, including hip: Secondary | ICD-10-CM | POA: Diagnosis not present

## 2013-05-22 DIAGNOSIS — L408 Other psoriasis: Secondary | ICD-10-CM | POA: Diagnosis not present

## 2013-05-22 DIAGNOSIS — D0470 Carcinoma in situ of skin of unspecified lower limb, including hip: Secondary | ICD-10-CM | POA: Diagnosis not present

## 2013-05-22 DIAGNOSIS — C44711 Basal cell carcinoma of skin of unspecified lower limb, including hip: Secondary | ICD-10-CM | POA: Diagnosis not present

## 2013-05-31 ENCOUNTER — Other Ambulatory Visit: Payer: Self-pay | Admitting: Family Medicine

## 2013-06-07 DIAGNOSIS — M79609 Pain in unspecified limb: Secondary | ICD-10-CM | POA: Diagnosis not present

## 2013-06-07 DIAGNOSIS — M2550 Pain in unspecified joint: Secondary | ICD-10-CM | POA: Diagnosis not present

## 2013-07-23 DIAGNOSIS — M353 Polymyalgia rheumatica: Secondary | ICD-10-CM | POA: Diagnosis not present

## 2013-07-23 DIAGNOSIS — M159 Polyosteoarthritis, unspecified: Secondary | ICD-10-CM | POA: Diagnosis not present

## 2013-07-23 DIAGNOSIS — IMO0002 Reserved for concepts with insufficient information to code with codable children: Secondary | ICD-10-CM | POA: Diagnosis not present

## 2013-08-01 DIAGNOSIS — L820 Inflamed seborrheic keratosis: Secondary | ICD-10-CM | POA: Diagnosis not present

## 2013-08-01 DIAGNOSIS — D0470 Carcinoma in situ of skin of unspecified lower limb, including hip: Secondary | ICD-10-CM | POA: Diagnosis not present

## 2013-08-01 DIAGNOSIS — L905 Scar conditions and fibrosis of skin: Secondary | ICD-10-CM | POA: Diagnosis not present

## 2013-08-24 ENCOUNTER — Other Ambulatory Visit: Payer: Self-pay | Admitting: *Deleted

## 2013-08-24 MED ORDER — PREGABALIN 75 MG PO CAPS: 75.0000 mg | ORAL_CAPSULE | Freq: Two times a day (BID) | ORAL | Status: DC

## 2013-08-24 NOTE — Telephone Encounter (Signed)
Refilled prescription ,notified patient she is to pick up the prescription and take it to the pharmacy

## 2013-10-01 ENCOUNTER — Telehealth: Payer: Self-pay | Admitting: *Deleted

## 2013-10-01 NOTE — Telephone Encounter (Signed)
Explained to patient that dr Milinda Pointer did prescribe medication, but she has to pick up the prescription in our office, it has been here since the 2nd of January.

## 2013-12-17 ENCOUNTER — Other Ambulatory Visit: Payer: Self-pay | Admitting: Family Medicine

## 2013-12-18 NOTE — Telephone Encounter (Signed)
Please schedule f/u in sept and refill until then-thanks

## 2013-12-18 NOTE — Telephone Encounter (Signed)
Electronic refill request, no recent/future appt., please advise  

## 2013-12-20 NOTE — Telephone Encounter (Signed)
appt scheduled and med refilled 

## 2013-12-28 ENCOUNTER — Ambulatory Visit (INDEPENDENT_AMBULATORY_CARE_PROVIDER_SITE_OTHER): Payer: Medicare Other | Admitting: Family Medicine

## 2013-12-28 ENCOUNTER — Encounter: Payer: Self-pay | Admitting: Family Medicine

## 2013-12-28 VITALS — BP 136/78 | HR 55 | Temp 98.2°F | Ht 63.5 in | Wt 148.0 lb

## 2013-12-28 DIAGNOSIS — F411 Generalized anxiety disorder: Secondary | ICD-10-CM | POA: Diagnosis not present

## 2013-12-28 DIAGNOSIS — R413 Other amnesia: Secondary | ICD-10-CM

## 2013-12-28 DIAGNOSIS — I10 Essential (primary) hypertension: Secondary | ICD-10-CM

## 2013-12-28 MED ORDER — PAROXETINE HCL 20 MG PO TABS: ORAL_TABLET | ORAL | Status: DC

## 2013-12-28 MED ORDER — PROPRANOLOL HCL ER 120 MG PO CP24: ORAL_CAPSULE | ORAL | Status: DC

## 2013-12-28 MED ORDER — POTASSIUM CHLORIDE ER 10 MEQ PO TBCR: EXTENDED_RELEASE_TABLET | ORAL | Status: DC

## 2013-12-28 NOTE — Patient Instructions (Signed)
Take care of yourself  Please be careful not to fall- use a cane or walker if needed Stay social and as active as you can be  Follow up in 6 months for an annual exam with labs prior    Fall Prevention and [place] cause injuries and can affect all age groups. It is possible to use preventive measures to significantly decrease the likelihood of falls. There are many simple measures which can make your home safer and prevent falls. OUTDOORS  Repair cracks and edges of walkways and driveways.  Remove high doorway thresholds.  Trim shrubbery on the main path into your home.  Have good outside lighting.  Clear walkways of tools, rocks, debris, and clutter.  Check that handrails are not broken and are securely fastened. Both sides of steps should have handrails.  Have leaves, snow, and ice cleared regularly.  Use sand or salt on walkways during winter months.  In the garage, clean up grease or oil spills. BATHROOM  Install night lights.  Install grab bars by the toilet and in the tub and shower.  Use non-skid mats or decals in the tub or shower.  Place a plastic non-slip stool in the shower to sit on, if needed.  Keep floors dry and clean up all water on the floor immediately.  Remove soap buildup in the tub or shower on a regular basis.  Secure bath mats with non-slip, double-sided rug tape.  Remove throw rugs and tripping hazards from the floors. BEDROOMS  Install night lights.  Make sure a bedside light is easy to reach.  Do not use oversized bedding.  Keep a telephone by your bedside.  Have a firm chair with side arms to use for getting dressed.  Remove throw rugs and tripping hazards from the floor. KITCHEN  Keep handles on pots and pans turned toward the center of the stove. Use back burners when possible.  Clean up spills quickly and allow time for drying.  Avoid walking on wet floors.  Avoid hot utensils and knives.  Position shelves so  they are not too high or low.  Place commonly used objects within easy reach.  If necessary, use a sturdy step stool with a grab bar when reaching.  Keep electrical cables out of the way.  Do not use floor polish or wax that makes floors slippery. If you must use wax, use non-skid floor wax.  Remove throw rugs and tripping hazards from the floor. STAIRWAYS  Never leave objects on stairs.  Place handrails on both sides of stairways and use them. Fix any loose handrails. Make sure handrails on both sides of the stairways are as long as the stairs.  Check carpeting to make sure it is firmly attached along stairs. Make repairs to worn or loose carpet promptly.  Avoid placing throw rugs at the top or bottom of stairways, or properly secure the rug with carpet tape to prevent slippage. Get rid of throw rugs, if possible.  Have an electrician put in a light switch at the top and bottom of the stairs. OTHER FALL PREVENTION TIPS  Wear low-heel or rubber-soled shoes that are supportive and fit well. Wear closed toe shoes.  When using a stepladder, make sure it is fully opened and both spreaders are firmly locked. Do not climb a closed stepladder.  Add color or contrast paint or tape to grab bars and handrails in your home. Place contrasting color strips on first and last steps.  Learn and use  mobility aids as needed. Install an electrical emergency response system.  Turn on lights to avoid dark areas. Replace light bulbs that burn out immediately. Get light switches that glow.  Arrange furniture to create clear pathways. Keep furniture in the same place.  Firmly attach carpet with non-skid or double-sided tape.  Eliminate uneven floor surfaces.  Select a carpet pattern that does not visually hide the edge of steps.  Be aware of all pets. OTHER HOME SAFETY TIPS  Set the water temperature for 120 F (48.8 C).  Keep emergency numbers on or near the telephone.  Keep smoke  detectors on every level of the home and near sleeping areas. Document Released: 07/30/2002 Document Revised: 02/08/2012 Document Reviewed: 10/29/2011 [facility] Patient Information 2014 [place].

## 2013-12-28 NOTE — Progress Notes (Signed)
Subjective:    Patient ID: Suzanne Monroe, female    DOB: 30-Mar-1933, 80 y.o.   MRN: [id number]  HPI Here for f/u of chronic medical problems   Doing well overall  She lives alone  Not a lot of socialization  Her family wanted her to move closer - and she just does not want to  She loves where she lives  She feels safe at home   She socializes with her sister and one of her daughter  Does not like to travel a lot because she moved over 30 times   Takes care of her dog with DM and other diseases and she worries a lot about it   Still c/o short term memory problems / frustrates her but is not bad  Does not misplace things - simply forgets names and words  Does crossword puzzles all the time  Hard to read with her vision problems    Has traveled to [place] to see her great grand children - had a good time   bp is stable today  No cp or palpitations or headaches or edema  No side effects to medicines  BP Readings from Last 3 Encounters:  12/28/13 136/78  05/07/13 134/74  05/01/13 144/80     Wt is up 13 lb with bmi of 25  She is eating well and the IBS is improved   Mood has been pretty good  Stays motivated  Last anxiety was with family-now she has calmed down   No falls since her last visit  Walks with a cane  Wears shoes Took her rugs up   Patient Active Problem List   Diagnosis Date Noted  . Fibromyalgia 05/15/2013  . Low TSH level 05/07/2013  . Memory loss 05/07/2013  . Hypokalemia 05/07/2013  . Frequent falls 05/07/2013  . Diarrhea 05/01/2013  . Stress reaction 05/01/2013  . Leg skin lesion, left 01/24/2013  . Neck injury 04/20/2011  . ANXIETY 11/11/2008  . MENOPAUSAL SYNDROME 04/24/2008  . DEPRESSION 10/30/2007  . DYSPEPSIA 10/30/2007  . HYPERCHOLESTEROLEMIA, PURE 03/30/2007  . ICHTHYOSIS 03/29/2007  . MIGRAINE, CHRONIC 11/03/2006  . HYPERTENSION 11/03/2006  . IRRITABLE BOWEL SYNDROME 11/03/2006  . FIBROCYSTIC BREAST DISEASE 11/03/2006  .  OSTEOARTHRITIS 11/03/2006  . POLYMYALGIA RHEUMATICA 11/03/2006  . ANA POSITIVE, HX OF 11/03/2006  . PERIPHERAL NEUROPATHY 12/21/2000  . COLITIS, ULCERATIVE NOS 08/23/1993   Past Medical History  Diagnosis Date  . Hypertension   . Arthritis     OA  . 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 Macular degeneration   . Anxiety   . Depression   . Migraine     Hx of  . Cerumen impaction     recurrent Lt  . Peripheral neuropathy    Past Surgical History  Procedure Laterality Date  . Appendectomy    . Cholecystectomy    . Rhinoplasty    . Shoulder surgery      left shoulder replacement   History  Substance Use Topics  . Smoking status: Former Smoker    Quit date: 08/24/1983  . Smokeless tobacco: Never Used  . Alcohol Use: No   Family History  Problem Relation Age of Onset  . Stroke Mother   . Hypertension Mother   . Alcohol abuse Father   . Heart disease Father   . Kidney disease Father     dialysis  . Hypertension Brother   . Hypertension Daughter   .  Cancer Maternal Aunt     colon CA  . Cancer Maternal Uncle     liver CA   Allergies  Allergen Reactions  . Ampicillin     REACTION: reaction not known  . Cefaclor     REACTION: diarrhea  . Codeine     REACTION: diarrhea  . Duloxetine     REACTION: severe diarrhea  . Fexofenadine     REACTION: fuzzy  . Ibuprofen     REACTION: GI  . Loratadine     REACTION: blurred vision  . Nsaids     REACTION: fatigue   Current Outpatient Prescriptions on File Prior to Visit  Medication Sig Dispense Refill  . ammonium lactate (LAC-HYDRIN) 12 % lotion Apply topically as needed.        Marland Kitchen lisinopril-hydrochlorothiazide (PRINZIDE,ZESTORETIC) 20-12.5 MG per tablet TAKE 1/2 TABLET BY MOUTH TWICE DAILY  90 tablet  3  . loperamide (IMODIUM A-D) 2 MG tablet Take 2 mg by mouth 4 (four) times daily as needed.        . pregabalin (LYRICA) 75 MG capsule Take 1 capsule (75 mg total) by mouth 2 (two)  times daily.  60 capsule  5  . ranitidine (ZANTAC) 150 MG capsule Take 150 mg by mouth 2 (two) times daily as needed.       . Calcium Carbonate-Vitamin D 600-400 MG-UNIT per tablet Take 1 tablet by mouth 2 (two) times daily.         No current facility-administered medications on file prior to visit.    Review of Systems Review of Systems  Constitutional: Negative for fever, appetite change, fatigue and unexpected weight change.  Eyes: Negative for pain and visual disturbance.  Respiratory: Negative for cough and shortness of breath.   Cardiovascular: Negative for cp or palpitations    Gastrointestinal: Negative for nausea, diarrhea and constipation.  Genitourinary: Negative for urgency and frequency.  Skin: Negative for pallor or rash   Neurological: Negative for weakness, light-headedness, numbness and headaches.  Hematological: Negative for adenopathy. Does not bruise/bleed easily.  Psychiatric/Behavioral: Negative for dysphoric mood. The patient is  Nervous/anxious at times          Objective:   Physical Exam  Constitutional: She appears well-developed and well-nourished. No distress.  HENT:  Head: Normocephalic and atraumatic.  Mouth/Throat: Oropharynx is clear and moist.  Eyes: Conjunctivae and EOM are normal. Pupils are equal, round, and reactive to light. Right eye exhibits no discharge. Left eye exhibits no discharge. No scleral icterus.  Neck: Normal range of motion. Neck supple. No thyromegaly present.  Cardiovascular: Normal rate, regular rhythm, normal heart sounds and intact distal pulses.  Exam reveals no gallop.   Pulmonary/Chest: Effort normal and breath sounds normal. No respiratory distress. She has no wheezes. She has no rales.  Abdominal: Soft. Bowel sounds are normal. She exhibits no distension. There is no tenderness. There is no rebound.  Musculoskeletal: She exhibits no edema.  Lymphadenopathy:    She has no cervical adenopathy.  Neurological: She is alert.  She has normal reflexes. No cranial nerve deficit. She exhibits normal muscle tone. Coordination normal.  Skin: Skin is warm and dry. No rash noted.  Psychiatric: She has a normal mood and affect.          Assessment & Plan:

## 2013-12-28 NOTE — Progress Notes (Signed)
Pre visit review using our clinic review tool, if applicable. No additional management support is needed unless otherwise documented below in the visit note. 

## 2013-12-29 ENCOUNTER — Telehealth: Payer: Self-pay | Admitting: Family Medicine

## 2013-12-29 NOTE — Telephone Encounter (Signed)
Relevant patient education mailed to patient.  

## 2013-12-30 NOTE — Assessment & Plan Note (Signed)
Pt continues paxil - with fair control of anxiety Reviewed stressors/ coping techniques/symptoms/ support sources/ tx options and side effects in detail today

## 2013-12-30 NOTE — Assessment & Plan Note (Signed)
Per pt this is no better and no worse - mostly short term things  Disc imp of socialization-she does get out a fair amount  Also brain activity- pt does word puzzles often - struggles a bit with her vision Will continue to monitor closely She states she is able to care for herself and her residence/ finances with no problem

## 2013-12-30 NOTE — Assessment & Plan Note (Signed)
bp in fair control at this time  BP Readings from Last 1 Encounters:  12/28/13 136/78   No changes needed Disc lifstyle change with low sodium diet and exercise

## 2014-01-17 ENCOUNTER — Encounter: Payer: Self-pay | Admitting: Internal Medicine

## 2014-01-19 ENCOUNTER — Other Ambulatory Visit: Payer: Self-pay | Admitting: Family Medicine

## 2014-01-27 ENCOUNTER — Other Ambulatory Visit: Payer: Self-pay | Admitting: Family Medicine

## 2014-02-18 ENCOUNTER — Telehealth: Payer: Self-pay | Admitting: Family Medicine

## 2014-02-18 NOTE — Telephone Encounter (Signed)
Faye Ramsay is Genesia's daughter.  She has an appt to bring her mom on 7/6.  She wants to know if you will call her before that appointment to talk about her mom. She is very worried about her mom.

## 2014-02-19 ENCOUNTER — Telehealth: Payer: Self-pay | Admitting: Family Medicine

## 2014-02-19 ENCOUNTER — Encounter: Payer: Self-pay | Admitting: Family Medicine

## 2014-02-19 ENCOUNTER — Ambulatory Visit (INDEPENDENT_AMBULATORY_CARE_PROVIDER_SITE_OTHER): Payer: Medicare Other | Admitting: Family Medicine

## 2014-02-19 VITALS — BP 150/88 | HR 96 | Temp 98.1°F | Wt 143.2 lb

## 2014-02-19 DIAGNOSIS — R110 Nausea: Secondary | ICD-10-CM | POA: Diagnosis not present

## 2014-02-19 DIAGNOSIS — R5381 Other malaise: Secondary | ICD-10-CM | POA: Diagnosis not present

## 2014-02-19 DIAGNOSIS — R5383 Other fatigue: Secondary | ICD-10-CM | POA: Diagnosis not present

## 2014-02-19 NOTE — Assessment & Plan Note (Addendum)
No clear evidence if this episode is similar to past (what sounds like yearly N/V that is not likely viral and more likely due to IBS as PCP stated previously in 2014) Reassure pt we will check cbc to eval immune system.  Nausea is resolved now. ? If nause is due to SE of meds, ? GERD

## 2014-02-19 NOTE — Telephone Encounter (Signed)
Patient Information:  Caller Name: Nasha  Phone: [phone]  Patient: Suzanne Monroe, Suzanne Monroe  Gender: Female  DOB: Oct 24, 1932  Age: 80 Years  PCP: Loura Pardon [facility])  Office Follow Up:  Does the office need to follow up with this patient?: No  Instructions For The Office: N/A  RN Note:  Levada Dy (sp?) scheduled appointment with Dr. Diona Browner at 15:15.  Symptoms  Reason For Call & Symptoms: Onset 02/16/2014 of nausea and weakness.  Still c/o weakness.  Reviewed Health History In EMR: Yes  Reviewed Medications In EMR: Yes  Reviewed Allergies In EMR: Yes  Reviewed Surgeries / Procedures: Yes  Date of Onset of Symptoms: 02/16/2014  Guideline(s) Used:  Weakness (Generalized) and Fatigue  Disposition Per Guideline:   See Today in Office  Reason For Disposition Reached:   Moderate weakness (i.e., interferes with work, school, normal activities) and persists > 3 days  Advice Given:  Call Back If:  Unable to stand or walk  Passes out  Breathing difficulty occurs  You become worse.  Patient Will Follow Care Advice:  YES  Appointment Scheduled:  02/19/2014 15:15:00 Appointment Scheduled Provider:  Eliezer Lofts [facility])

## 2014-02-19 NOTE — Assessment & Plan Note (Addendum)
Will eval with labs. Nml neuro exam.  ? If due to med SE, but no recent changes.  Pt denies depression.

## 2014-02-19 NOTE — Patient Instructions (Addendum)
Stop at lab on way out.  Push fluids. Follow up with PCP Dr. Glori Bickers in 2 weeks.

## 2014-02-19 NOTE — Progress Notes (Signed)
Pre visit review using our clinic review tool, if applicable. No additional management support is needed unless otherwise documented below in the visit note. 

## 2014-02-19 NOTE — Telephone Encounter (Signed)
Patient has an appointment with you today at 3:15.Patient's daughter called to let you know she's worried that patient is not feeling well because she has a dog that's not potty trained and is going to the bathroom in the house.  She said the urine has soaked into the floors and the smell is so bad you can smell it on the front and  back porch.  Her daughter can't stay in the house for more than 10 minutes. Patient's daughter said it's a volatile subject between her and patient.  She asked that you not mention that she told you about the house. She said patient's unable to take care of the mess and her daughter is trying to move her out of the house.

## 2014-02-19 NOTE — Progress Notes (Signed)
Subjective:    Patient ID: Suzanne Monroe, female    DOB: Jan 25, 1933, 80 y.o.   MRN: [id number]  HPI   80 year old female with history of anxiety, depression, ulcerative colitis,  HTN, fibromyalgia,IBS,  PMR presents with new onset fatigue.  She awoke nauseous on Saturday. ( 4 days ago) Has continued since, no vomiting and diarrhea.  She feels some better today. Able to eat soup today, drinking liquids.  She feels very fatigue, cannot do activity. Ongoing in last 4 days. No dysuria. No fever. No skin rash. No abdominal pain. Occ heartburn. Tums of zantac No new meds.  She states has had recurrent viral infections ie. vomiting and diarrhea. Episode lasting 2-3 days. Last episode 04/2013 , none on record prior. "I have to get to the bottom of this, this is why I am here" Had nml TSH, potassium, cbc, Cdifficile and stool culture.  Per pt and from phone note daughter believes symptoms are from horrible odor of animal urine in her home.  On paxil for anxiety, lyrica for fibromyalgia: these are well controlled per pt. I didn't know I still have them.    Review of Systems  Constitutional: Positive for fatigue. Negative for fever.  HENT: Negative for ear pain.   Eyes: Negative for pain.  Respiratory: Negative for chest tightness and shortness of breath.   Cardiovascular: Negative for chest pain, palpitations and leg swelling.  Gastrointestinal: Negative for abdominal pain.  Genitourinary: Negative for dysuria.       Objective:   Physical Exam  Constitutional: Vital signs are normal. She appears well-developed and well-nourished. She is cooperative.  Non-toxic appearance. She does not appear ill. No distress.  HENT:  Head: Normocephalic.  Right Ear: Hearing, tympanic membrane, external ear and ear canal normal. Tympanic membrane is not erythematous, not retracted and not bulging.  Left Ear: Hearing, tympanic membrane, external ear and ear canal normal. Tympanic membrane is not  erythematous, not retracted and not bulging.  Nose: No mucosal edema or rhinorrhea. Right sinus exhibits no maxillary sinus tenderness and no frontal sinus tenderness. Left sinus exhibits no maxillary sinus tenderness and no frontal sinus tenderness.  Mouth/Throat: Uvula is midline, oropharynx is clear and moist and mucous membranes are normal.  Eyes: Conjunctivae, EOM and lids are normal. Pupils are equal, round, and reactive to light. Lids are everted and swept, no foreign bodies found.  Neck: Trachea normal and normal range of motion. Neck supple. Carotid bruit is not present. No mass and no thyromegaly present.  Cardiovascular: Normal rate, regular rhythm, S1 normal, S2 normal, normal heart sounds, intact distal pulses and normal pulses.  Exam reveals no gallop and no friction rub.   No murmur heard. Pulmonary/Chest: Effort normal and breath sounds normal. Not tachypneic. No respiratory distress. She has no decreased breath sounds. She has no wheezes. She has no rhonchi. She has no rales.  Abdominal: Soft. Normal appearance and bowel sounds are normal. There is no tenderness.  Neurological: She is alert. She has normal strength. No cranial nerve deficit or sensory deficit.  Stable chronic right eye droop.  Skin: Skin is warm, dry and intact. No rash noted.  Psychiatric: Her speech is normal and behavior is normal. Judgment and thought content normal. Her mood appears not anxious. Cognition and memory are normal. She does not exhibit a depressed mood.          Assessment & Plan:  Given chronic issues will have pt follow up with PCP in 2  weeks.

## 2014-02-19 NOTE — Telephone Encounter (Signed)
Noted  

## 2014-02-20 LAB — COMPREHENSIVE METABOLIC PANEL
ALBUMIN: 4.3 g/dL (ref 3.5–5.2)
ALT: 23 U/L (ref 0–35)
AST: 32 U/L (ref 0–37)
Alkaline Phosphatase: 55 U/L (ref 39–117)
BUN: 11 mg/dL (ref 6–23)
CALCIUM: 9.4 mg/dL (ref 8.4–10.5)
CHLORIDE: 110 meq/L (ref 96–112)
CO2: 30 mEq/L (ref 19–32)
Creatinine, Ser: 1 mg/dL (ref 0.4–1.2)
GFR: 58.19 mL/min — AB (ref >60.00)
GLUCOSE: 103 mg/dL — AB (ref 70–99)
POTASSIUM: 3.4 meq/L — AB (ref 3.5–5.1)
Sodium: 143 mEq/L (ref 135–145)
Total Bilirubin: 0.7 mg/dL (ref 0.2–1.2)
Total Protein: 7 g/dL (ref 6.0–8.3)

## 2014-02-20 LAB — TSH: TSH: 0.31 u[IU]/mL — ABNORMAL LOW (ref 0.35–4.50)

## 2014-02-20 LAB — CBC WITH DIFFERENTIAL/PLATELET
Basophils Absolute: 0 10*3/uL (ref 0.0–0.1)
Basophils Relative: 0.3 % (ref 0.0–3.0)
EOS ABS: 0.1 10*3/uL (ref 0.0–0.7)
Eosinophils Relative: 1.3 % (ref 0.0–5.0)
HEMATOCRIT: 40.8 % (ref 36.0–46.0)
Hemoglobin: 13.7 g/dL (ref 12.0–15.0)
Lymphocytes Relative: 29.2 % (ref 12.0–46.0)
Lymphs Abs: 3 10*3/uL (ref 0.7–4.0)
MCHC: 33.7 g/dL (ref 30.0–36.0)
MCV: 91.3 fl (ref 78.0–100.0)
MONO ABS: 0.9 10*3/uL (ref 0.1–1.0)
Monocytes Relative: 8.7 % (ref 3.0–12.0)
NEUTROS PCT: 60.5 % (ref 43.0–77.0)
Neutro Abs: 6.2 10*3/uL (ref 1.4–7.7)
Platelets: 264 10*3/uL (ref 150.0–400.0)
RBC: 4.46 Mil/uL (ref 3.87–5.11)
RDW: 13.7 % (ref 11.5–15.5)
WBC: 10.3 10*3/uL (ref 4.0–10.5)

## 2014-02-20 NOTE — Telephone Encounter (Signed)
Spoke with [name] - the concerns she will bring up at the visit include memory loss/ confusion/financial troubles (ability to manage), safety, unsafe (dirty) living area and hygeine  She will come with her  Interested in trying again to move her to [facility]   Will disc at visit on 7/6 with pt

## 2014-02-21 ENCOUNTER — Ambulatory Visit: Payer: Medicare Other

## 2014-02-21 DIAGNOSIS — R946 Abnormal results of thyroid function studies: Secondary | ICD-10-CM

## 2014-02-21 LAB — T4, FREE: FREE T4: 1.21 ng/dL (ref 0.60–1.60)

## 2014-02-21 LAB — T3, FREE: T3 FREE: 2.7 pg/mL (ref 2.3–4.2)

## 2014-02-25 ENCOUNTER — Ambulatory Visit (INDEPENDENT_AMBULATORY_CARE_PROVIDER_SITE_OTHER): Payer: Medicare Other | Admitting: Family Medicine

## 2014-02-25 ENCOUNTER — Encounter: Payer: Self-pay | Admitting: Family Medicine

## 2014-02-25 VITALS — BP 140/78 | HR 57 | Temp 98.7°F | Ht 63.5 in | Wt 143.5 lb

## 2014-02-25 DIAGNOSIS — I10 Essential (primary) hypertension: Secondary | ICD-10-CM

## 2014-02-25 DIAGNOSIS — R413 Other amnesia: Secondary | ICD-10-CM

## 2014-02-25 MED ORDER — DONEPEZIL HCL 5 MG PO TABS: 5.0000 mg | ORAL_TABLET | Freq: Every day | ORAL | Status: DC

## 2014-02-25 NOTE — Progress Notes (Signed)
Subjective:    Patient ID: Suzanne Monroe, female    DOB: 17-Jan-1933, 80 y.o.   MRN: [id number]  HPI Here with her daughter Suzanne Monroe with some concerns re: memory/ self care   Disc this briefly in the fall - MMS exam 29/30  Blamed her problems on recent GI illness  She had generalized nausea and weakness - and loss of appetite  Never vomited and no diarrhea -does not think she had a fever   She tends to have GI illnesses often   Saw Dr Diona Browner recently for GI illness bp a bit high- similar today BP Readings from Last 3 Encounters:  02/25/14 150/78  02/19/14 150/88  12/28/13 136/78   gets dizzy easily   Re check of bp is improved 140/78- she admits to being nervous   Per daughter one fall outside -pt denies an actual fall   She notes she has to write everything down  She did forget a date with her sister  Short term memory is the biggest problem  In some denial    Memory loss - pt missed lunch date with her sister  Forgets days of the week Once she got lost going to a familiar place  Forgets what day it is  Misplacing item  Finances-unable to do her taxes Pt admits to some confusion  Will not ask family for help  ? Not paying attn to bills or paperwork   Hygiene-good for self - but living conditions are unhealthy- pet accidents and odor  Cares for a sick dog Has broken some teeth herself  -did not make a dental appt   Safety- concens about neuropathy with driving  Hx of falls , and bad living conditions   Family worries that her GI problems - from animals   Patient Active Problem List   Diagnosis Date Noted  . Nausea alone 02/19/2014  . Other malaise and fatigue 02/19/2014  . Fibromyalgia 05/15/2013  . Low TSH level 05/07/2013  . Memory loss 05/07/2013  . Hypokalemia 05/07/2013  . Frequent falls 05/07/2013  . Stress reaction 05/01/2013  . Leg skin lesion, left 01/24/2013  . Neck injury 04/20/2011  . ANXIETY 11/11/2008  . MENOPAUSAL SYNDROME 04/24/2008  .  DEPRESSION 10/30/2007  . DYSPEPSIA 10/30/2007  . HYPERCHOLESTEROLEMIA, PURE 03/30/2007  . ICHTHYOSIS 03/29/2007  . MIGRAINE, CHRONIC 11/03/2006  . HYPERTENSION 11/03/2006  . IRRITABLE BOWEL SYNDROME 11/03/2006  . FIBROCYSTIC BREAST DISEASE 11/03/2006  . OSTEOARTHRITIS 11/03/2006  . POLYMYALGIA RHEUMATICA 11/03/2006  . ANA POSITIVE, HX OF 11/03/2006  . PERIPHERAL NEUROPATHY 12/21/2000  . COLITIS, ULCERATIVE NOS 08/23/1993   Past Medical History  Diagnosis Date  . Hypertension   . Arthritis     OA  . 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 Macular degeneration   . Anxiety   . Depression   . Migraine     Hx of  . Cerumen impaction     recurrent Lt  . Peripheral neuropathy    Past Surgical History  Procedure Laterality Date  . Appendectomy    . Cholecystectomy    . Rhinoplasty    . Shoulder surgery      left shoulder replacement   History  Substance Use Topics  . Smoking status: Former Smoker    Quit date: 08/24/1983  . Smokeless tobacco: Never Used  . Alcohol Use: No   Family History  Problem Relation Age of Onset  . Stroke Mother   .  Hypertension Mother   . Alcohol abuse Father   . Heart disease Father   . Kidney disease Father     dialysis  . Hypertension Brother   . Hypertension Daughter   . Cancer Maternal Aunt     colon CA  . Cancer Maternal Uncle     liver CA   Allergies  Allergen Reactions  . Ampicillin     REACTION: reaction not known  . Cefaclor     REACTION: diarrhea  . Codeine     REACTION: diarrhea  . Duloxetine     REACTION: severe diarrhea  . Fexofenadine     REACTION: fuzzy  . Ibuprofen     REACTION: GI  . Loratadine     REACTION: blurred vision  . Nsaids     REACTION: fatigue   Current Outpatient Prescriptions on File Prior to Visit  Medication Sig Dispense Refill  . ammonium lactate (LAC-HYDRIN) 12 % lotion Apply topically as needed.        . Calcium Carbonate-Vitamin D 600-400 MG-UNIT  per tablet Take 1 tablet by mouth 2 (two) times daily.        . fluticasone (FLONASE) 50 MCG/ACT nasal spray Place 2 sprays into the nose as needed.      Marland Kitchen lisinopril-hydrochlorothiazide (PRINZIDE,ZESTORETIC) 20-12.5 MG per tablet TAKE 1/2 TABLET BY MOUTH TWICE DAILY  90 tablet  3  . loperamide (IMODIUM A-D) 2 MG tablet Take 2 mg by mouth 4 (four) times daily as needed.        . Melatonin 5 MG TABS Take 1 tablet by mouth at bedtime.      Marland Kitchen PARoxetine (PAXIL) 20 MG tablet TAKE 1/2 TABLET BY MOUTH THREE TIMES DAILY  135 tablet  3  . potassium chloride (KLOR-CON 10) 10 MEQ tablet TAKE 1 TABLET (10 MEQ TOTAL) BY MOUTH DAILY.  90 tablet  3  . PREDNISONE, PAK, PO Take by mouth. Taper dose      . pregabalin (LYRICA) 75 MG capsule Take 1 capsule (75 mg total) by mouth 2 (two) times daily.  60 capsule  5  . propranolol ER (INDERAL LA) 120 MG 24 hr capsule 1 pill by mouth twice daily  180 capsule  3  . ranitidine (ZANTAC) 150 MG capsule Take 150 mg by mouth 2 (two) times daily as needed.        No current facility-administered medications on file prior to visit.     Review of Systems Review of Systems  Constitutional: Negative for fever, appetite change, fatigue and unexpected weight change.  Eyes: Negative for pain and visual disturbance.  Respiratory: Negative for cough and shortness of breath.   Cardiovascular: Negative for cp or palpitations    Gastrointestinal: Negative for nausea, diarrhea and constipation.  Genitourinary: Negative for urgency and frequency.  Skin: Negative for pallor or rash   MSK pos for arthritis aches and pains  Neurological: Negative for weakness, light-headedness, numbness and headaches. pos for declining balance  Hematological: Negative for adenopathy. Does not bruise/bleed easily.  Psychiatric/Behavioral: Negative for dysphoric mood. The patient is sometimes  nervous/anxious.  - she denies being lonely but complains of it to family occasionally       Objective:    Physical Exam  Constitutional: She is oriented to person, place, and time. She appears well-developed and well-nourished. No distress.  HENT:  Head: Normocephalic and atraumatic.  Mouth/Throat: Oropharynx is clear and moist.  Eyes: Conjunctivae and EOM are normal. Pupils are equal, round, and reactive to  light. No scleral icterus.  Neck: Normal range of motion. Neck supple. No JVD present. Carotid bruit is not present. No thyromegaly present.  Cardiovascular: Normal rate, regular rhythm, normal heart sounds and intact distal pulses.  Exam reveals no gallop.   Pulmonary/Chest: Effort normal and breath sounds normal. No respiratory distress. She has no wheezes. She has no rales.  Musculoskeletal: She exhibits no edema.  Lymphadenopathy:    She has no cervical adenopathy.  Neurological: She is alert and oriented to person, place, and time. She has normal reflexes. She displays no atrophy and no tremor. No cranial nerve deficit or sensory deficit. She exhibits normal muscle tone. Coordination and gait normal.  Poor short term memory  Skin: Skin is warm and dry. No rash noted. No erythema. No pallor.  Psychiatric: Her speech is normal and behavior is normal. Her mood appears anxious. Her affect is not blunt, not labile and not inappropriate. Thought content is not paranoid. Cognition and memory are impaired. She exhibits abnormal recent memory.  Pt seems a bit defensive with family about memory disorder-but does admit to having problems She is attentive and pleasant today          Assessment & Plan:   Problem List Items Addressed This Visit     Cardiovascular and Mediastinum   HYPERTENSION     Repeat bp is improved  BP: 140/78 mmHg   Will watch carefully-pt was anxious coming in today  Disc diet and activity      Other   Memory loss - Primary     Despite high score on MMS exam in the fall - I do think she has some degree of mild cognitive impairment at this time - chronic  In detail  disc problems with short term memory/ cognition/ occ confusion / and safety at home  Some worry about the condition she lives in with animals she cannot clean up after  Long disc with family including her daughter today-I expressed concern and agree that she should live closer to family - they are going to check out a residence today  Also disc re: prev or slowing further memory loss -will begin aricept 5 mg qhs (disc poss side eff) F/u planned-if tol will adv to 10 mg  Long disc re: personal safety  Enc her to keep working on puzzles/reading/math and importantly socialization

## 2014-02-25 NOTE — Progress Notes (Signed)
Pre visit review using our clinic review tool, if applicable. No additional management support is needed unless otherwise documented below in the visit note. 

## 2014-02-25 NOTE — Patient Instructions (Signed)
Start aricept 5 mg at bedtime - if any problems or side effects let me know  Stay active physically and socially  Get as much help as you can with animal care  Talk your family about moving closer  Follow up with me in 4-6 weeks

## 2014-02-25 NOTE — Assessment & Plan Note (Addendum)
Repeat bp is improved  BP: 140/78 mmHg   Will watch carefully-pt was anxious coming in today  Disc diet and activity

## 2014-02-25 NOTE — Assessment & Plan Note (Addendum)
Despite high score on MMS exam in the fall - I do think she has some degree of mild cognitive impairment at this time - chronic  In detail disc problems with short term memory/ cognition/ occ confusion / and safety at home  Some worry about the condition she lives in with animals she cannot clean up after  Long disc with family including her daughter today-I expressed concern and agree that she should live closer to family - they are going to check out a residence today  Also disc re: prev or slowing further memory loss -will begin aricept 5 mg qhs (disc poss side eff) F/u planned-if tol will adv to 10 mg  Long disc re: personal safety  Enc her to keep working on puzzles/reading/math and importantly socialization

## 2014-03-12 ENCOUNTER — Telehealth: Payer: Self-pay

## 2014-03-12 NOTE — Telephone Encounter (Signed)
If what she says is true- it sounds like she probably should not drive (per this call it sounds like multiple people agree on that)- you may need to talk to her about this with those other concerned folks with you to convince her that she should stop driving for safety (I know that is difficult) If you call the DMV/liscence agency- they also may be able to give some guidance.

## 2014-03-12 NOTE — Telephone Encounter (Signed)
[name] pts daughter said pt is moving to retirement community near [place] in [place] on 03/16/14. Pt seems to be determined to continue to drive from [facility] to [place] after the move to see friends and come to see Dr Glori Bickers. [name] is concerned with pts dimentia about pt driving. [name] said she will not ride with pt and pts present neighbors have voiced concern about pts driving safety to [place]. The retirement home supplies transportation to grocery store, pharmacy and doctor's appts. [name] request Dr Marliss Coots guidance about her mother continuing to drive and request what is the next step to stopping pt from driving. [name] has not discussed with pt yet due to so many changes for pt with this move to [place]. Lisa request cb.

## 2014-03-14 NOTE — Telephone Encounter (Signed)
Daughter notified of Dr. Marliss Coots comments/recommendations

## 2014-03-25 ENCOUNTER — Ambulatory Visit: Payer: Medicare Other | Admitting: Family Medicine

## 2014-03-25 DIAGNOSIS — Z0289 Encounter for other administrative examinations: Secondary | ICD-10-CM

## 2014-04-24 ENCOUNTER — Telehealth: Payer: Self-pay | Admitting: Family Medicine

## 2014-04-24 NOTE — Telephone Encounter (Signed)
I called [name], pts daughter, and she is going to contact the pharmacy and fax the med list to my attention. I will get it to you as soon as it comes. The medication that was so expensive was pt's blood pressure medication. Thank you

## 2014-04-24 NOTE — Telephone Encounter (Signed)
Pt's daughter, [name], is calling very concerned about her mother. Has some questions and concerns she would like to discuss with you. Pt's daughter, [name], lives out of town so its hard for her to travel her but would like a phone call back to discuss pt's meds that she takes. Pt currently lives in a retire community and pt's daughter is worried that pt may not be taking all her meds. Pts daughter would like to go over med list so she may organize pts meds to be taken correctly daily. Pt's daughter did go to [facility] and pick up pts BP med and it was $232 out of pocket. This was also a generic RX. Is there another BP med pt's mother can be placed on that would be much cheaper since they "live [place] to [place]." Pts daughter has jsut recently taken over care of her mother and has a lot on her and wants to talk about pt with you. Thank you

## 2014-04-24 NOTE — Telephone Encounter (Signed)
Please ask her which med was so $$ and to please get me a list of covered bp meds from pharmacy  I will contact her when I can review that  Thanks  Please send this back to me

## 2014-04-24 NOTE — Telephone Encounter (Signed)
Benjie Karvonen sent me a message saying "Faye Ramsay called. She said she was talking to [facility] but I think she meant to say Sharaya Boruff about calling her pharmacy to see which BP meds Medicare would approve. She said that the pharmacy told her that they could not provide that to her. She did not leave the pt's name or information so I'm hoping you have a clue as to what she's talking about."  Called daughter and advise her if the pharmacy can't give her a list of alt meds then pt's insurance will have the list, daughter will check with insurance company to get a list of alt meds and will have it either faxed or sent to [place]

## 2014-07-05 ENCOUNTER — Ambulatory Visit: Payer: Medicare Other | Admitting: Family Medicine

## 2014-07-05 DIAGNOSIS — J019 Acute sinusitis, unspecified: Secondary | ICD-10-CM | POA: Diagnosis not present

## 2014-07-05 DIAGNOSIS — I10 Essential (primary) hypertension: Secondary | ICD-10-CM | POA: Diagnosis not present

## 2014-08-22 ENCOUNTER — Telehealth: Payer: Self-pay | Admitting: Family Medicine

## 2014-08-22 NOTE — Telephone Encounter (Signed)
Pts daughter [name] is requesting a letter stating that the patient has been called for jury duty and they need a letter stating that she has dementia so that she does not have to attend jury duty. [name] would also like something to show Mrs. Ciani to prove to her that she does have dementia, she often gets upset that [name] will not let her do certain things like driving and she needs something so that her Mom understands why. She has gotten lost once and it was quite a scary ordeal. [name] would like this mailed if possible since she lives in [place]. Please give [name] a call back either way. [name] did wan to let Dr Glori Bickers know that otherwise she is doing quite well. They got her finances straightened out and she is doing great other than understanding why [name] won't let her do things like drive herself.

## 2014-08-26 NOTE — Telephone Encounter (Signed)
Letters mailed to pt's daughter and daughter notified

## 2014-08-26 NOTE — Telephone Encounter (Signed)
Both letters are in IN box

## 2014-09-29 DIAGNOSIS — J040 Acute laryngitis: Secondary | ICD-10-CM | POA: Diagnosis not present

## 2014-10-10 DIAGNOSIS — H43811 Vitreous degeneration, right eye: Secondary | ICD-10-CM | POA: Diagnosis not present

## 2014-10-10 DIAGNOSIS — H43812 Vitreous degeneration, left eye: Secondary | ICD-10-CM | POA: Diagnosis not present

## 2014-10-10 DIAGNOSIS — H3581 Retinal edema: Secondary | ICD-10-CM | POA: Diagnosis not present

## 2014-10-10 DIAGNOSIS — H43813 Vitreous degeneration, bilateral: Secondary | ICD-10-CM | POA: Diagnosis not present

## 2014-10-10 DIAGNOSIS — H34812 Central retinal vein occlusion, left eye: Secondary | ICD-10-CM | POA: Diagnosis not present

## 2014-10-24 ENCOUNTER — Telehealth: Payer: Self-pay | Admitting: Family Medicine

## 2014-10-24 NOTE — Telephone Encounter (Signed)
That is fine 

## 2014-10-24 NOTE — Telephone Encounter (Signed)
Im fine with this if Dr. Glori Bickers approves.

## 2014-10-24 NOTE — Telephone Encounter (Signed)
PT scheduled for new patient appointment on 3/7 with Brunetta Jeans. Approved to transfer providers?

## 2014-10-28 ENCOUNTER — Other Ambulatory Visit: Payer: Self-pay | Admitting: Physician Assistant

## 2014-10-28 ENCOUNTER — Encounter: Payer: Self-pay | Admitting: Physician Assistant

## 2014-10-28 ENCOUNTER — Telehealth: Payer: Self-pay | Admitting: Physician Assistant

## 2014-10-28 ENCOUNTER — Ambulatory Visit (INDEPENDENT_AMBULATORY_CARE_PROVIDER_SITE_OTHER): Payer: Medicare Other | Admitting: Physician Assistant

## 2014-10-28 VITALS — BP 213/68 | HR 51 | Temp 97.7°F | Ht 62.0 in | Wt 128.8 lb

## 2014-10-28 DIAGNOSIS — R296 Repeated falls: Secondary | ICD-10-CM

## 2014-10-28 DIAGNOSIS — I499 Cardiac arrhythmia, unspecified: Secondary | ICD-10-CM | POA: Diagnosis not present

## 2014-10-28 DIAGNOSIS — H34819 Central retinal vein occlusion, unspecified eye: Secondary | ICD-10-CM

## 2014-10-28 DIAGNOSIS — E785 Hyperlipidemia, unspecified: Secondary | ICD-10-CM | POA: Diagnosis not present

## 2014-10-28 DIAGNOSIS — M7989 Other specified soft tissue disorders: Secondary | ICD-10-CM | POA: Diagnosis not present

## 2014-10-28 DIAGNOSIS — H348192 Central retinal vein occlusion, unspecified eye, stable: Secondary | ICD-10-CM | POA: Insufficient documentation

## 2014-10-28 DIAGNOSIS — I10 Essential (primary) hypertension: Secondary | ICD-10-CM | POA: Insufficient documentation

## 2014-10-28 LAB — COMPREHENSIVE METABOLIC PANEL
ALT: 11 U/L (ref 0–35)
AST: 17 U/L (ref 0–37)
Albumin: 4.4 g/dL (ref 3.5–5.2)
Alkaline Phosphatase: 81 U/L (ref 39–117)
BUN: 20 mg/dL (ref 6–23)
CO2: 25 mEq/L (ref 19–32)
CREATININE: 0.78 mg/dL (ref 0.40–1.20)
Calcium: 10 mg/dL (ref 8.4–10.5)
Chloride: 106 mEq/L (ref 96–112)
GFR: 75.59 mL/min (ref >60.00)
Glucose, Bld: 92 mg/dL (ref 70–99)
POTASSIUM: 4.2 meq/L (ref 3.5–5.1)
SODIUM: 139 meq/L (ref 135–145)
TOTAL PROTEIN: 7 g/dL (ref 6.0–8.3)
Total Bilirubin: 0.5 mg/dL (ref 0.2–1.2)

## 2014-10-28 LAB — CBC
HEMATOCRIT: 41.2 % (ref 36.0–46.0)
HEMOGLOBIN: 13.9 g/dL (ref 12.0–15.0)
MCHC: 33.7 g/dL (ref 30.0–36.0)
MCV: 90.7 fl (ref 78.0–100.0)
Platelets: 229 10*3/uL (ref 150.0–400.0)
RBC: 4.54 Mil/uL (ref 3.87–5.11)
RDW: 13.2 % (ref 11.5–15.5)
WBC: 7.6 10*3/uL (ref 4.0–10.5)

## 2014-10-28 LAB — LIPID PANEL
CHOLESTEROL: 164 mg/dL (ref 0–200)
HDL: 53.9 mg/dL (ref >39.00)
LDL Cholesterol: 84 mg/dL (ref 0–99)
NonHDL: 110.1
TRIGLYCERIDES: 130 mg/dL (ref 0.0–149.0)
Total CHOL/HDL Ratio: 3
VLDL: 26 mg/dL (ref 0.0–40.0)

## 2014-10-28 LAB — TSH: TSH: 0.87 u[IU]/mL (ref 0.35–4.50)

## 2014-10-28 MED ORDER — LISINOPRIL-HYDROCHLOROTHIAZIDE 20-12.5 MG PO TABS: ORAL_TABLET | ORAL | Status: DC

## 2014-10-28 NOTE — Progress Notes (Signed)
Pre visit review using our clinic review tool, if applicable. No additional management support is needed unless otherwise documented below in the visit note. 

## 2014-10-28 NOTE — Telephone Encounter (Signed)
Caller name: lindsay from [facility] Relation to pt: Call back number: [phone] Pharmacy:  Reason for call:   Needs new order with ICD 10 with dx on order for L lower extremity venous doppler. She states that [place] ordered this. Fax. [phone]

## 2014-10-28 NOTE — Assessment & Plan Note (Signed)
Will restart Prinzide. Continue inderal.  DASH diet discussed.  Handout given.  Follow-up 2-3 weeks.

## 2014-10-28 NOTE — Assessment & Plan Note (Signed)
Order for PT placed to [facility] to help with strengthening exercises and gait stability.

## 2014-10-28 NOTE — Assessment & Plan Note (Signed)
Will obtain US to rule out DVT.  Possibly MSK or baker cyst related. Supportive measures, bracing and OTC pain medication discussed.    Addendum -- [place] negative for DVT.  Shows sign of ruptured baker cyst.  Reiterated pain medication and supportive measures.

## 2014-10-28 NOTE — Telephone Encounter (Signed)
Spoke with [facility] regarding [place] as results had not been faxed over.  [place] negative for DVT. Does show signs of potential ruptured baker's cyst which coincides with location of pain.   Called daughter to relay message.  No answer. Left Detailed message on machine.

## 2014-10-28 NOTE — Telephone Encounter (Signed)
Caller name:Lisa Derrick Relationship to patient:daughter Can be [place]:  Reason for call:Requesting results of venous

## 2014-10-28 NOTE — Patient Instructions (Signed)
Please continue your medications as directed. Restart the Lisiopril-Hydrochlorothiazide, taking as directed. Read information below on the DASH diet for blood pressure. Stop by the lab for blood work.  Follow-up in 2 weeks.  For the knee pain, I am concerned of a clot giving your leg swelling and high blood pressure. Please stop by the front desk to speak with Marj or Jen to schedule your [place].  They will call me with your results before letting you leave so that I can start treatment if a clot is found. Apply topical Aspercreme to your knee. Move around as tolerated.  I will call you regarding the Physical Therapy referral once everything has been placed.  DASH Eating Plan DASH stands for "Dietary Approaches to Stop Hypertension." The DASH eating plan is a healthy eating plan that has been shown to reduce high blood pressure (hypertension). Additional health benefits may include reducing the risk of type 2 diabetes mellitus, heart disease, and stroke. The DASH eating plan may also help with weight loss. WHAT DO I NEED TO KNOW ABOUT THE DASH EATING PLAN? For the DASH eating plan, you will follow these general guidelines:  Choose foods with a percent daily value for sodium of less than 5% (as listed on the food label).  Use salt-free seasonings or herbs instead of table salt or sea salt.  Check with your health care provider or pharmacist before using salt substitutes.  Eat lower-sodium products, often labeled as "lower sodium" or "no salt added."  Eat fresh foods.  Eat more vegetables, fruits, and low-fat dairy products.  Choose whole grains. Look for the word "whole" as the first word in the ingredient list.  Choose fish and skinless chicken or [place] more often than red meat. Limit fish, poultry, and meat to 6 oz (170 g) each day.  Limit sweets, desserts, sugars, and sugary drinks.  Choose heart-healthy fats.  Limit cheese to 1 oz (28 g) per day.  Eat more home-cooked food and  less restaurant, buffet, and fast food.  Limit fried foods.  Cook foods using methods other than frying.  Limit canned vegetables. If you do use them, rinse them well to decrease the sodium.  When eating at a restaurant, ask that your food be prepared with less salt, or no salt if possible. WHAT FOODS CAN I EAT? Seek help from a dietitian for individual calorie needs. Grains Whole grain or whole wheat bread. Brown rice. Whole grain or whole wheat pasta. Quinoa, bulgur, and whole grain cereals. Low-sodium cereals. Corn or whole wheat flour tortillas. Whole grain cornbread. Whole grain crackers. Low-sodium crackers. Vegetables Fresh or frozen vegetables (raw, steamed, roasted, or grilled). Low-sodium or reduced-sodium tomato and vegetable juices. Low-sodium or reduced-sodium tomato sauce and paste. Low-sodium or reduced-sodium canned vegetables.  Fruits All fresh, canned (in natural juice), or frozen fruits. Meat and Other Protein Products Ground beef (85% or leaner), grass-fed beef, or beef trimmed of fat. Skinless chicken or [place]. Ground chicken or [place]. Pork trimmed of fat. All fish and seafood. Eggs. Dried beans, peas, or lentils. Unsalted nuts and seeds. Unsalted canned beans. Dairy Low-fat dairy products, such as skim or 1% milk, 2% or reduced-fat cheeses, low-fat ricotta or cottage cheese, or plain low-fat yogurt. Low-sodium or reduced-sodium cheeses. Fats and Oils Tub margarines without trans fats. Light or reduced-fat mayonnaise and salad dressings (reduced sodium). Avocado. Safflower, olive, or canola oils. Natural peanut or almond butter. Other Unsalted popcorn and pretzels. The items listed above may not be a complete  list of recommended foods or beverages. Contact your dietitian for more options. WHAT FOODS ARE NOT RECOMMENDED? Grains White bread. White pasta. White rice. Refined cornbread. Bagels and croissants. Crackers that contain trans fat. Vegetables Creamed or  fried vegetables. Vegetables in a cheese sauce. Regular canned vegetables. Regular canned tomato sauce and paste. Regular tomato and vegetable juices. Fruits Dried fruits. Canned fruit in light or heavy syrup. Fruit juice. Meat and Other Protein Products Fatty cuts of meat. Ribs, chicken wings, bacon, sausage, bologna, salami, chitterlings, fatback, hot dogs, bratwurst, and packaged luncheon meats. Salted nuts and seeds. Canned beans with salt. Dairy Whole or 2% milk, cream, half-and-half, and cream cheese. Whole-fat or sweetened yogurt. Full-fat cheeses or blue cheese. Nondairy creamers and whipped toppings. Processed cheese, cheese spreads, or cheese curds. Condiments Onion and garlic salt, seasoned salt, table salt, and sea salt. Canned and packaged gravies. Worcestershire sauce. Tartar sauce. Barbecue sauce. Teriyaki sauce. Soy sauce, including reduced sodium. Steak sauce. Fish sauce. Oyster sauce. Cocktail sauce. Horseradish. Ketchup and mustard. Meat flavorings and tenderizers. Bouillon cubes. Hot sauce. Tabasco sauce. Marinades. Taco seasonings. Relishes. Fats and Oils Butter, stick margarine, lard, shortening, ghee, and bacon fat. Coconut, palm kernel, or palm oils. Regular salad dressings. Other Pickles and olives. Salted popcorn and pretzels. The items listed above may not be a complete list of foods and beverages to avoid. Contact your dietitian for more information. WHERE CAN I FIND MORE INFORMATION? National Heart, Lung, and Blood Institute: travelstabloid.com Document Released: 07/29/2011 Document Revised: 12/24/2013 Document Reviewed: 06/13/2013 [facility] Patient Information 2015 [place], [place]. This information is not intended to replace advice given to you by your health care provider. Make sure you discuss any questions you have with your health care provider.

## 2014-10-28 NOTE — Assessment & Plan Note (Signed)
Will check fasting lipid panel today.  

## 2014-10-28 NOTE — Assessment & Plan Note (Signed)
Will restart all BP medications to get under control.  Will assess lipid panel to see if hyperlipidemia is present.  Follow-up with Ophthalmology as scheduled for steroid injections.

## 2014-10-28 NOTE — Assessment & Plan Note (Signed)
Patient endorses.  Family denies history of such.  EKG reveals sinus bradycardia. Continue medications as directed.

## 2014-10-28 NOTE — Progress Notes (Signed)
Patient presents to clinic today to transfer care.  Hypertension -- Endorses previously well-controlled on Inderal and Prinzide.  Has been out of Prinzide for several months. Patient denies chest pain, palpitations, lightheadedness, dizziness, vision changes or frequent headaches.  BP obtained via electronic machine at 213/68 today.  Manual recheck at 180/70.    Depression -- well controlled on Paxil 10 mg BID presently.  Denies suicidal thoughts or ideation. Denies anxiety.  Dementia -- worsening per family member. Patient previously on Aricept 5 mg some noted improvement. Patient and family unsure about restarting medication presently patient is living in an assisted living community. He is not allowed to drive.  GERD -- well controlled with Zantac when necessary. Denies breakthrough symptoms.  Left knee pain -- present for almost 1 week. Patient and family deny history of injury. Denies swelling of knee but notes swelling of With associated leg pain. Denies numbness or tingling. Denies history of DVT or PE.  Central retinal vein occlusion -- followed by ophthalmology. His having steroid injections every 3-4 weeks. Family was told patient's cholesterol needed to be checked.  Past Medical History  Diagnosis Date  . Hypertension   . Arthritis     OA  . 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 Macular degeneration   . Anxiety   . Depression   . Migraine     Hx of  . Cerumen impaction     recurrent Lt  . Peripheral neuropathy     Current Outpatient Prescriptions on File Prior to Visit  Medication Sig Dispense Refill  . loperamide (IMODIUM A-D) 2 MG tablet Take 2 mg by mouth 4 (four) times daily as needed.      . propranolol ER (INDERAL LA) 120 MG 24 hr capsule 1 pill by mouth twice daily 180 capsule 3  . ammonium lactate (LAC-HYDRIN) 12 % lotion Apply topically as needed.      . Calcium Carbonate-Vitamin D 600-400 MG-UNIT per tablet Take 1 tablet  by mouth 2 (two) times daily.      Marland Kitchen donepezil (ARICEPT) 5 MG tablet Take 1 tablet (5 mg total) by mouth at bedtime. 30 tablet 3  . Melatonin 5 MG TABS Take 1 tablet by mouth at bedtime.    . potassium chloride (KLOR-CON 10) 10 MEQ tablet TAKE 1 TABLET (10 MEQ TOTAL) BY MOUTH DAILY. 90 tablet 3  . ranitidine (ZANTAC) 150 MG capsule Take 150 mg by mouth 2 (two) times daily as needed.      No current facility-administered medications on file prior to visit.    Allergies  Allergen Reactions  . Ampicillin     REACTION: reaction not known  . Cefaclor     REACTION: diarrhea  . Codeine     REACTION: diarrhea  . Duloxetine     REACTION: severe diarrhea  . Fexofenadine     REACTION: fuzzy  . Ibuprofen     REACTION: GI  . Loratadine     REACTION: blurred vision  . Nsaids     REACTION: fatigue    Family History  Problem Relation Age of Onset  . Stroke Mother   . Hypertension Mother   . Alcohol abuse Father   . Heart disease Father   . Kidney disease Father     dialysis  . Hypertension Brother   . Hypertension Daughter   . Cancer Maternal Aunt     colon CA  . Cancer Maternal Uncle  liver CA    History   Social History  . Marital Status: Widowed    Spouse Name: N/A  . Number of Children: N/A  . Years of Education: N/A   Social History Main Topics  . Smoking status: Former Smoker    Quit date: 08/24/1983  . Smokeless tobacco: Never Used  . Alcohol Use: No  . Drug Use: No  . Sexual Activity: Not on file   Other Topics Concern  . None   Social History Narrative   Review of Systems - See HPI.  All other ROS are negative.  BP 213/68 mmHg  Pulse 51  Temp(Src) 97.7 F (36.5 C) (Oral)  Ht 5\' 2"  (1.575 m)  Wt 128 lb 12.8 oz (58.423 kg)  BMI 23.55 kg/m2  SpO2 99%  Physical Exam  Constitutional: She is well-developed, well-nourished, and in no distress.  HENT:  Head: Normocephalic and atraumatic.  Eyes: Conjunctivae are normal. Pupils are equal, round, and  reactive to light.  Neck: Neck supple.  Cardiovascular: Normal rate, regular rhythm, normal heart sounds and intact distal pulses.   Pulses:      Popliteal pulses are 2+ on the right side, and 2+ on the left side.       Dorsalis pedis pulses are 2+ on the right side, and 2+ on the left side.       Posterior tibial pulses are 2+ on the right side, and 2+ on the left side.  Calf swelling of RLE noted with questionable homan sign.   Pulmonary/Chest: Effort normal and breath sounds normal. No respiratory distress. She has no wheezes. She has no rales. She exhibits no tenderness.  Lymphadenopathy:    She has no cervical adenopathy.  Neurological: She is alert.  Skin: Skin is warm and dry. No rash noted.  Psychiatric: Affect normal.  Vitals reviewed.  Assessment/Plan: Hyperlipidemia Will check fasting lipid panel today.   Irregular heartbeat Patient endorses.  Family denies history of such.  EKG reveals sinus bradycardia. Continue medications as directed.   Leg swelling Will obtain US to rule out DVT.  Possibly MSK or baker cyst related. Supportive measures, bracing and OTC pain medication discussed.    Addendum -- [place] negative for DVT.  Shows sign of ruptured baker cyst.  Reiterated pain medication and supportive measures.   Essential hypertension Will restart Prinzide. Continue inderal.  DASH diet discussed.  Handout given.  Follow-up 2-3 weeks.   Central retinal vein occlusion Will restart all BP medications to get under control.  Will assess lipid panel to see if hyperlipidemia is present.  Follow-up with Ophthalmology as scheduled for steroid injections.    Frequent falls Order for PT placed to [facility] to help with strengthening exercises and gait stability.

## 2014-10-29 NOTE — Telephone Encounter (Signed)
Order refaxed.//AB/CMA

## 2014-11-06 ENCOUNTER — Encounter: Payer: Self-pay | Admitting: Family Medicine

## 2014-11-11 DIAGNOSIS — H3581 Retinal edema: Secondary | ICD-10-CM | POA: Diagnosis not present

## 2014-11-11 DIAGNOSIS — H34812 Central retinal vein occlusion, left eye: Secondary | ICD-10-CM | POA: Diagnosis not present

## 2014-11-13 ENCOUNTER — Ambulatory Visit (INDEPENDENT_AMBULATORY_CARE_PROVIDER_SITE_OTHER): Payer: Medicare Other | Admitting: Physician Assistant

## 2014-11-13 ENCOUNTER — Encounter: Payer: Self-pay | Admitting: Physician Assistant

## 2014-11-13 VITALS — BP 136/86 | HR 56 | Temp 98.0°F | Resp 16 | Wt 130.2 lb

## 2014-11-13 DIAGNOSIS — Z23 Encounter for immunization: Secondary | ICD-10-CM

## 2014-11-13 DIAGNOSIS — I10 Essential (primary) hypertension: Secondary | ICD-10-CM

## 2014-11-13 NOTE — Progress Notes (Signed)
Patient presents to clinic today for follow-up of BP.  Patient currently on Prinzide 20-12.5 mg daily and Inderal LA 120 mg daily.  Patient denies chest pain, palpitations, lightheadedness, dizziness, vision changes or frequent headaches.  Past Medical History  Diagnosis Date  . Hypertension   . Arthritis     OA  . 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 Macular degeneration   . Anxiety   . Depression   . Migraine     Hx of  . Cerumen impaction     recurrent Lt  . Peripheral neuropathy     Current Outpatient Prescriptions on File Prior to Visit  Medication Sig Dispense Refill  . ammonium lactate (LAC-HYDRIN) 12 % lotion Apply topically as needed.      . Calcium Carbonate-Vitamin D 600-400 MG-UNIT per tablet Take 1 tablet by mouth 2 (two) times daily.      Marland Kitchen donepezil (ARICEPT) 5 MG tablet Take 1 tablet (5 mg total) by mouth at bedtime. 30 tablet 3  . lisinopril-hydrochlorothiazide (PRINZIDE,ZESTORETIC) 20-12.5 MG per tablet TAKE 1/2 TABLET BY MOUTH TWICE DAILY 90 tablet 3  . loperamide (IMODIUM A-D) 2 MG tablet Take 2 mg by mouth 4 (four) times daily as needed.      . Melatonin 5 MG TABS Take 1 tablet by mouth at bedtime.    . potassium chloride (KLOR-CON 10) 10 MEQ tablet TAKE 1 TABLET (10 MEQ TOTAL) BY MOUTH DAILY. 90 tablet 3  . propranolol ER (INDERAL LA) 120 MG 24 hr capsule 1 pill by mouth twice daily 180 capsule 3  . ranitidine (ZANTAC) 150 MG capsule Take 150 mg by mouth 2 (two) times daily as needed.      No current facility-administered medications on file prior to visit.    Allergies  Allergen Reactions  . Ampicillin     REACTION: reaction not known  . Cefaclor     REACTION: diarrhea  . Codeine     REACTION: diarrhea  . Duloxetine     REACTION: severe diarrhea  . Fexofenadine     REACTION: fuzzy  . Ibuprofen     REACTION: GI  . Loratadine     REACTION: blurred vision  . Nsaids     REACTION: fatigue    Family  History  Problem Relation Age of Onset  . Stroke Mother   . Hypertension Mother   . Alcohol abuse Father   . Heart disease Father   . Kidney disease Father     dialysis  . Hypertension Brother   . Hypertension Daughter   . Cancer Maternal Aunt     colon CA  . Cancer Maternal Uncle     liver CA    History   Social History  . Marital Status: Widowed    Spouse Name: N/A  . Number of Children: N/A  . Years of Education: N/A   Social History Main Topics  . Smoking status: Former Smoker    Quit date: 08/24/1983  . Smokeless tobacco: Never Used  . Alcohol Use: No  . Drug Use: No  . Sexual Activity: Not on file   Other Topics Concern  . None   Social History Narrative   Review of Systems - See HPI.  All other ROS are negative.  BP 136/86 mmHg  Pulse 56  Temp(Src) 98 F (36.7 C) (Oral)  Resp 16  Wt 130 lb 4 oz (59.081 kg)  SpO2 98%  Physical Exam  Constitutional: She is oriented to person, place, and time and well-developed, well-nourished, and in no distress.  HENT:  Head: Normocephalic and atraumatic.  Cardiovascular: Normal rate, regular rhythm, normal heart sounds and intact distal pulses.   Pulmonary/Chest: Effort normal and breath sounds normal. No respiratory distress. She has no wheezes. She has no rales. She exhibits no tenderness.  Neurological: She is alert and oriented to person, place, and time.  Skin: Skin is warm and dry. No rash noted.  Psychiatric: Affect normal.  Vitals reviewed.   Recent Results (from the past 2160 hour(s))  CBC     Status: None   Collection Time: 10/28/14 10:46 AM  Result Value Ref Range   WBC 7.6 4.0 - 10.5 K/uL   RBC 4.54 3.87 - 5.11 Mil/uL   Platelets 229.0 150.0 - 400.0 K/uL   Hemoglobin 13.9 12.0 - 15.0 g/dL   HCT 41.2 36.0 - 46.0 %   MCV 90.7 78.0 - 100.0 fl   MCHC 33.7 30.0 - 36.0 g/dL   RDW 13.2 11.5 - 15.5 %  TSH     Status: None   Collection Time: 10/28/14 10:46 AM  Result Value Ref Range   TSH 0.87 0.35 -  4.50 uIU/mL  Lipid panel     Status: None   Collection Time: 10/28/14 10:46 AM  Result Value Ref Range   Cholesterol 164 0 - 200 mg/dL    Comment: ATP III Classification       Desirable:  < 200 mg/dL               Borderline High:  200 - 239 mg/dL          High:  > = 240 mg/dL   Triglycerides 130.0 0.0 - 149.0 mg/dL    Comment: Normal:  <150 mg/dLBorderline High:  150 - 199 mg/dL   HDL 53.90 >39.00 mg/dL   VLDL 26.0 0.0 - 40.0 mg/dL   LDL Cholesterol 84 0 - 99 mg/dL   Total CHOL/HDL Ratio 3     Comment:                Men          Women1/2 Average Risk     3.4          3.3Average Risk          5.0          4.42X Average Risk          9.6          7.13X Average Risk          15.0          11.0                       NonHDL 110.10     Comment: NOTE:  Non-HDL goal should be 30 mg/dL higher than patient's LDL goal (i.e. LDL goal of < 70 mg/dL, would have non-HDL goal of < 100 mg/dL)  Comp Met (CMET)     Status: None   Collection Time: 10/28/14 10:46 AM  Result Value Ref Range   Sodium 139 135 - 145 mEq/L   Potassium 4.2 3.5 - 5.1 mEq/L   Chloride 106 96 - 112 mEq/L   CO2 25 19 - 32 mEq/L   Glucose, Bld 92 70 - 99 mg/dL   BUN 20 6 - 23 mg/dL   Creatinine, Ser 0.78 0.40 - 1.20 mg/dL   Total Bilirubin 0.5  0.2 - 1.2 mg/dL   Alkaline Phosphatase 81 39 - 117 U/L   AST 17 0 - 37 U/L   ALT 11 0 - 35 U/L   Total Protein 7.0 6.0 - 8.3 g/dL   Albumin 4.4 3.5 - 5.2 g/dL   Calcium 10.0 8.4 - 10.5 mg/dL   GFR 75.59 >60.00 mL/min    Assessment/Plan: Essential hypertension Much improved after resuming antihypertensive regimen. Asymptomatic. We'll continue current regimen. Follow-up in 6 months.

## 2014-11-13 NOTE — Addendum Note (Signed)
Addended by: Leticia Penna A on: 11/13/2014 09:52 AM   Modules accepted: Orders

## 2014-11-13 NOTE — Assessment & Plan Note (Signed)
Much improved after resuming antihypertensive regimen. Asymptomatic. We'll continue current regimen. Follow-up in 6 months.

## 2014-11-13 NOTE — Progress Notes (Signed)
Pre visit review using our clinic review tool, if applicable. No additional management support is needed unless otherwise documented below in the visit note. 

## 2014-11-13 NOTE — Patient Instructions (Signed)
Your blood pressure is looking much better today. I'm glad that it is also made to feel much better. Please continue your current medication regimen.  Follow-up with me in 6 months for your blood pressure. Return sooner if you need me.

## 2014-12-12 DIAGNOSIS — H34812 Central retinal vein occlusion, left eye: Secondary | ICD-10-CM | POA: Diagnosis not present

## 2014-12-12 DIAGNOSIS — H3581 Retinal edema: Secondary | ICD-10-CM | POA: Diagnosis not present

## 2014-12-13 ENCOUNTER — Encounter: Payer: Self-pay | Admitting: Internal Medicine

## 2015-01-06 ENCOUNTER — Other Ambulatory Visit: Payer: Self-pay | Admitting: Physician Assistant

## 2015-01-09 DIAGNOSIS — H34812 Central retinal vein occlusion, left eye: Secondary | ICD-10-CM | POA: Diagnosis not present

## 2015-01-09 DIAGNOSIS — H3581 Retinal edema: Secondary | ICD-10-CM | POA: Diagnosis not present

## 2015-02-06 DIAGNOSIS — H34812 Central retinal vein occlusion, left eye: Secondary | ICD-10-CM | POA: Diagnosis not present

## 2015-02-06 DIAGNOSIS — H3581 Retinal edema: Secondary | ICD-10-CM | POA: Diagnosis not present

## 2015-03-03 ENCOUNTER — Other Ambulatory Visit: Payer: Self-pay | Admitting: Family Medicine

## 2015-03-06 ENCOUNTER — Other Ambulatory Visit: Payer: Self-pay | Admitting: Physician Assistant

## 2015-03-06 DIAGNOSIS — H34812 Central retinal vein occlusion, left eye: Secondary | ICD-10-CM | POA: Diagnosis not present

## 2015-03-06 DIAGNOSIS — H3581 Retinal edema: Secondary | ICD-10-CM | POA: Diagnosis not present

## 2015-03-06 NOTE — Telephone Encounter (Signed)
Caller name: [name] Relationship to patient: daughter Can be reached: [phone] Pharmacy: [facility] [address] in [facility]  Reason for call: Needs refill on Paxil. Will be out Sunday.If it needs picked up from office please call them. She also said the lisinopril is costing them $70 a month, they just got it filled. Let them know if there is a less expensive alternative.

## 2015-03-07 MED ORDER — PAROXETINE HCL 20 MG PO TABS: ORAL_TABLET | ORAL | Status: DC

## 2015-03-07 NOTE — Telephone Encounter (Signed)
Requesting Paxil 20mg -Take 1/2 tablet in the am,and 1/2 tablet in the evening. Last refill:have never fill for the pt Last OV:11/13/14-for BP follow-up-return in 6 mos  Also requesting less expensive alternative for the Lisinopril the cost is $70.00 per month. Please advise.//AB/CMA

## 2015-03-07 NOTE — Telephone Encounter (Signed)
Since pt just got her propranolol filled, we will hold on changing the medication until her primary Einar Pheasant) returns

## 2015-03-07 NOTE — Telephone Encounter (Signed)
Called and spoke with the pt's daughter and informed her of the note below.  She verbalized understanding.  Informed the daughter that the pt is already on the Lisinopril/HCTZ.  The daughter stated that she made an mistake its the Propranolol ER that's $70.00 per month.//AB/CMA

## 2015-03-07 NOTE — Telephone Encounter (Signed)
Called and [facility] @ 3:29pm @ [phone]) informing the pt's daughter the note below.//AB/CMA

## 2015-03-07 NOTE — Telephone Encounter (Signed)
Ok to refill Paxil, #30, no refills Lisinopril/HCTZ is on the $4 list at [place] without using any insurance, they can get #30 pills for $4

## 2015-03-10 NOTE — Telephone Encounter (Signed)
Please advise.//AB/CMA 

## 2015-03-10 NOTE — Telephone Encounter (Signed)
Can we call downstairs to the pharmacy S[facility] Jacquet or Maudie Mercury) to see how much it would cost for an equatable dose of regular acting propanolol?

## 2015-03-10 NOTE — Telephone Encounter (Signed)
Called and spoke with the pharmacy Suzanne Monroe) and informed her of the note below.  She checked into it and stated that they could do the Propanolol with out the LA-60mg -taking 2 tablet 2 times a day (120mg ).  The co-pay would be $12.00.//AB/CMA

## 2015-03-11 MED ORDER — PROPRANOLOL HCL 60 MG PO TABS: 120.0000 mg | ORAL_TABLET | Freq: Two times a day (BID) | ORAL | Status: DC

## 2015-03-11 NOTE — Telephone Encounter (Signed)
I have pended medication above -- Propranolol 60 mg tablets. Take 2 tablets twice daily. This will be 12.00 a month or 36.00 for a 51-month supply compared to 70.00. If patient ok with this, please send Rx above to pharmacy.

## 2015-04-01 ENCOUNTER — Other Ambulatory Visit: Payer: Self-pay | Admitting: Physician Assistant

## 2015-04-07 DIAGNOSIS — H6981 Other specified disorders of Eustachian tube, right ear: Secondary | ICD-10-CM | POA: Diagnosis not present

## 2015-04-10 DIAGNOSIS — H34812 Central retinal vein occlusion, left eye: Secondary | ICD-10-CM | POA: Diagnosis not present

## 2015-04-10 DIAGNOSIS — H3581 Retinal edema: Secondary | ICD-10-CM | POA: Diagnosis not present

## 2015-04-11 ENCOUNTER — Telehealth: Payer: Self-pay | Admitting: Physician Assistant

## 2015-04-11 NOTE — Telephone Encounter (Signed)
Called the daughter Faye Ramsay and she stated both times these readings the patient was at a Dr's office.  She has been getting steroiod shots in her left eye. The bleed is almost under control and so complete with these visits. On Monday she was at Urgent Care ([facility]) where BP was taken. The patient is doing well today, but has not had her BP checked .  The daughter did want to know what a good blood pressure for her mom would be.  Advise please

## 2015-04-11 NOTE — Telephone Encounter (Signed)
BP stable at visit with me 130s/80s. Anything 120/70-150/90 acceptable in patient her age although we prefer < 140/90. Is she taking BP medications as directed?

## 2015-04-11 NOTE — Telephone Encounter (Signed)
Pt's daughter wanted to just make you aware that the pt's BP last night was 188/90 and Monday it was 170/82. She's not requesting a call back. Providing phone number just incase you feel that a call back is needed.    Phone: [phone]

## 2015-04-15 NOTE — Telephone Encounter (Signed)
Called on (04/11/15) and LMOM informing the pt's daughter(Lisa Donna Christen) of the note below, and to give [place] a call back.//AB/CMA

## 2015-04-15 NOTE — Telephone Encounter (Addendum)
Called and spoke with the pt's daughter(Lisa Donna Christen) and she stated that she did not get the message on (04/11/15).  Informed her of the note below and she stated that it could be a possible that the pt was not taking her medications as directed.  She stated that she will be going over to the pt's house and watching her more closer and she will be checking the pt's BP and called back and letting her know what it is running.//AB/CMA

## 2015-05-07 DIAGNOSIS — S0083XA Contusion of other part of head, initial encounter: Secondary | ICD-10-CM | POA: Diagnosis not present

## 2015-05-08 DIAGNOSIS — H3581 Retinal edema: Secondary | ICD-10-CM | POA: Diagnosis not present

## 2015-05-08 DIAGNOSIS — H34812 Central retinal vein occlusion, left eye: Secondary | ICD-10-CM | POA: Diagnosis not present

## 2015-06-12 DIAGNOSIS — H34812 Central retinal vein occlusion, left eye, with macular edema: Secondary | ICD-10-CM | POA: Diagnosis not present

## 2015-07-31 DIAGNOSIS — H34812 Central retinal vein occlusion, left eye, with macular edema: Secondary | ICD-10-CM | POA: Diagnosis not present

## 2015-08-03 ENCOUNTER — Other Ambulatory Visit: Payer: Self-pay | Admitting: Physician Assistant

## 2015-09-24 DIAGNOSIS — H34812 Central retinal vein occlusion, left eye, with macular edema: Secondary | ICD-10-CM | POA: Diagnosis not present

## 2015-10-08 ENCOUNTER — Other Ambulatory Visit: Payer: Self-pay | Admitting: Physician Assistant

## 2015-10-29 ENCOUNTER — Other Ambulatory Visit: Payer: Self-pay | Admitting: Physician Assistant

## 2015-10-29 NOTE — Telephone Encounter (Signed)
Rx sent to the pharmacy by e-script.//AB/CMA 

## 2015-11-08 ENCOUNTER — Other Ambulatory Visit: Payer: Self-pay | Admitting: Physician Assistant

## 2015-11-13 DIAGNOSIS — H34812 Central retinal vein occlusion, left eye, with macular edema: Secondary | ICD-10-CM | POA: Diagnosis not present

## 2015-11-29 ENCOUNTER — Other Ambulatory Visit: Payer: Self-pay | Admitting: Physician Assistant

## 2015-12-08 ENCOUNTER — Other Ambulatory Visit: Payer: Self-pay | Admitting: Physician Assistant

## 2015-12-10 ENCOUNTER — Encounter: Payer: Self-pay | Admitting: Physician Assistant

## 2015-12-10 ENCOUNTER — Ambulatory Visit (INDEPENDENT_AMBULATORY_CARE_PROVIDER_SITE_OTHER): Payer: Medicare Other | Admitting: Physician Assistant

## 2015-12-10 VITALS — BP 108/60 | HR 64 | Temp 98.0°F | Ht 62.0 in | Wt 129.8 lb

## 2015-12-10 DIAGNOSIS — R7989 Other specified abnormal findings of blood chemistry: Secondary | ICD-10-CM

## 2015-12-10 DIAGNOSIS — R946 Abnormal results of thyroid function studies: Secondary | ICD-10-CM | POA: Diagnosis not present

## 2015-12-10 DIAGNOSIS — I10 Essential (primary) hypertension: Secondary | ICD-10-CM

## 2015-12-10 DIAGNOSIS — F419 Anxiety disorder, unspecified: Secondary | ICD-10-CM

## 2015-12-10 DIAGNOSIS — F329 Major depressive disorder, single episode, unspecified: Secondary | ICD-10-CM

## 2015-12-10 DIAGNOSIS — F418 Other specified anxiety disorders: Secondary | ICD-10-CM | POA: Diagnosis not present

## 2015-12-10 MED ORDER — DONEPEZIL HCL 5 MG PO TABS: 5.0000 mg | ORAL_TABLET | Freq: Every day | ORAL | Status: DC

## 2015-12-10 MED ORDER — LISINOPRIL 20 MG PO TABS: 10.0000 mg | ORAL_TABLET | Freq: Two times a day (BID) | ORAL | Status: DC

## 2015-12-10 NOTE — Progress Notes (Signed)
Patient presents to clinic today for follow-up medications.  Hypertension -- Is taking lisinopril-Hydrochlorothiazide 20-12.5 mg daily. Is also taking Propranolol twice daily.  Endorses taking as directed. Patient denies chest pain, palpitations, lightheadedness, dizziness, vision changes or frequent headaches. Patient is walking daily.   BP Readings from Last 3 Encounters:  12/10/15 108/60  11/13/14 136/86  10/28/14 213/68   Anxiety and Depression -- Patient is currently on Paxil 20 mg daily. Endorses taking as directed. Endorses good relief of symptoms with medications. Denies SI/HI.  Past Medical History  Diagnosis Date  . Hypertension   . Arthritis     OA  . 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 Macular degeneration   . Anxiety   . Depression   . Migraine     Hx of  . Cerumen impaction     recurrent Lt  . Peripheral neuropathy [facility])     Current Outpatient Prescriptions on File Prior to Visit  Medication Sig Dispense Refill  . loperamide (IMODIUM A-D) 2 MG tablet Take 2 mg by mouth 4 (four) times daily as needed.      Marland Kitchen PARoxetine (PAXIL) 20 MG tablet TAKE 1/2 TABLET BY MOUTH EVERY MORNING AND 1/2 TABLET EACH EVENING 30 tablet 3  . Calcium Carbonate-Vitamin D 600-400 MG-UNIT per tablet Take 1 tablet by mouth 2 (two) times daily. Reported on 12/10/2015    . Melatonin 5 MG TABS Take 1 tablet by mouth at bedtime.    . potassium chloride (KLOR-CON 10) 10 MEQ tablet TAKE 1 TABLET (10 MEQ TOTAL) BY MOUTH DAILY. 90 tablet 3  . propranolol (INDERAL) 60 MG tablet TAKE 2 TABLETS (120 MG TOTAL) BY MOUTH 2 (TWO) TIMES DAILY. 120 tablet 0   No current facility-administered medications on file prior to visit.    Allergies  Allergen Reactions  . Ampicillin     REACTION: reaction not known  . Cefaclor     REACTION: diarrhea  . Codeine     REACTION: diarrhea  . Duloxetine     REACTION: severe diarrhea  . Fexofenadine     REACTION: fuzzy  .  Ibuprofen     REACTION: GI  . Loratadine     REACTION: blurred vision  . Nsaids     REACTION: fatigue    Family History  Problem Relation Age of Onset  . Stroke Mother   . Hypertension Mother   . Alcohol abuse Father   . Heart disease Father   . Kidney disease Father     dialysis  . Hypertension Brother   . Hypertension Daughter   . Cancer Maternal Aunt     colon CA  . Cancer Maternal Uncle     liver CA    Social History   Social History  . Marital Status: Widowed    Spouse Name: N/A  . Number of Children: N/A  . Years of Education: N/A   Social History Main Topics  . Smoking status: Former Smoker    Quit date: 08/24/1983  . Smokeless tobacco: Never Used  . Alcohol Use: No  . Drug Use: No  . Sexual Activity: Not Asked   Other Topics Concern  . None   Social History Narrative   Review of Systems - See HPI.  All other ROS are negative.  BP 108/60 mmHg  Pulse 64  Temp(Src) 98 F (36.7 C) (Oral)  Ht '5\' 2"'$  (1.575 m)  Wt 129 lb 12.8 oz (58.877 kg)  BMI 23.73 kg/m2  SpO2 98%  Physical Exam  Constitutional: She is well-developed, well-nourished, and in no distress.  HENT:  Head: Normocephalic and atraumatic.  Eyes: Conjunctivae are normal.  Cardiovascular: Normal rate, regular rhythm, normal heart sounds and intact distal pulses.   Pulmonary/Chest: Effort normal and breath sounds normal. No respiratory distress. She has no wheezes. She has no rales. She exhibits no tenderness.  Neurological: She is alert.  Skin: Skin is warm and dry. No rash noted.  Psychiatric: Affect normal.  Vitals reviewed.   Recent Results (from the past 2160 hour(s))  Comp Met (CMET)     Status: Abnormal   Collection Time: 12/10/15  4:00 PM  Result Value Ref Range   Sodium 142 135 - 145 mEq/L   Potassium 3.5 3.5 - 5.1 mEq/L   Chloride 106 96 - 112 mEq/L   CO2 28 19 - 32 mEq/L   Glucose, Bld 90 70 - 99 mg/dL   BUN 22 6 - 23 mg/dL   Creatinine, Ser 1.07 0.40 - 1.20 mg/dL    Total Bilirubin 0.4 0.2 - 1.2 mg/dL   Alkaline Phosphatase 58 39 - 117 U/L   AST 21 0 - 37 U/L   ALT 18 0 - 35 U/L   Total Protein 6.8 6.0 - 8.3 g/dL   Albumin 4.5 3.5 - 5.2 g/dL   Calcium 9.8 8.4 - 10.5 mg/dL   GFR 52.34 (L) >60.00 mL/min  TSH     Status: None   Collection Time: 12/10/15  4:00 PM  Result Value Ref Range   TSH 0.83 0.35 - 4.50 uIU/mL    Assessment/Plan: Essential hypertension BP low end of normal. Will stop prinzide and substitute in plain lisinopril. DASH diet discussed. Will FU 1 month.  Anxiety and depression With mild cognitive impairment. Will continue Paxil and restart Aricept. FU 1 month.

## 2015-12-10 NOTE — Patient Instructions (Addendum)
Please go to the lab for blood work. I will call you and your daughter with the results.  We have changed your Prinzide to a plain lisinopril -- taking 1/2 tablet twice daily.  We are restarting your Aricept daily.  You will be contacted for a mammogram.  Follow-up with me in 1 month.

## 2015-12-10 NOTE — Progress Notes (Signed)
Pre visit review using our clinic review tool, if applicable. No additional management support is needed unless otherwise documented below in the visit note. 

## 2015-12-11 LAB — COMPREHENSIVE METABOLIC PANEL
ALBUMIN: 4.5 g/dL (ref 3.5–5.2)
ALT: 18 U/L (ref 0–35)
AST: 21 U/L (ref 0–37)
Alkaline Phosphatase: 58 U/L (ref 39–117)
BUN: 22 mg/dL (ref 6–23)
CHLORIDE: 106 meq/L (ref 96–112)
CO2: 28 mEq/L (ref 19–32)
CREATININE: 1.07 mg/dL (ref 0.40–1.20)
Calcium: 9.8 mg/dL (ref 8.4–10.5)
GFR: 52.34 mL/min — ABNORMAL LOW (ref >60.00)
GLUCOSE: 90 mg/dL (ref 70–99)
Potassium: 3.5 mEq/L (ref 3.5–5.1)
SODIUM: 142 meq/L (ref 135–145)
Total Bilirubin: 0.4 mg/dL (ref 0.2–1.2)
Total Protein: 6.8 g/dL (ref 6.0–8.3)

## 2015-12-11 LAB — TSH: TSH: 0.83 u[IU]/mL (ref 0.35–4.50)

## 2015-12-15 ENCOUNTER — Telehealth: Payer: Self-pay | Admitting: Physician Assistant

## 2015-12-15 NOTE — Telephone Encounter (Signed)
Pt's daughter Lonn Georgia returned call from [facility] for lab results.   CB: [phone]

## 2015-12-15 NOTE — Telephone Encounter (Signed)
Spoke with daughter and made her aware of lab results.

## 2015-12-17 ENCOUNTER — Encounter: Payer: Self-pay | Admitting: *Deleted

## 2015-12-21 NOTE — Assessment & Plan Note (Signed)
BP low end of normal. Will stop prinzide and substitute in plain lisinopril. DASH diet discussed. Will FU 1 month.

## 2015-12-21 NOTE — Assessment & Plan Note (Signed)
Will repeat TSH today to further assess.

## 2015-12-21 NOTE — Assessment & Plan Note (Signed)
With mild cognitive impairment. Will continue Paxil and restart Aricept. FU 1 month.

## 2016-01-01 ENCOUNTER — Telehealth: Payer: Self-pay | Admitting: Physician Assistant

## 2016-01-01 NOTE — Telephone Encounter (Signed)
Caller name:Lisa Donna Christen Relation to [id number] Call back number:[phone] Pharmacy:  Reason for call: pt has appt for 01/05/16 at 3:15, daughter would like to speak with Einar Pheasant prior to appt states mother has dementia and she has questions regarding meds she is currently taking. Also mom is demanding to drive and she needs to get a letter stating she is can not drive.

## 2016-01-01 NOTE — Telephone Encounter (Signed)
I will try to call her before the appointment if I get a chance. However I would recommend she attend the appointments with her mother so that these discussions can occur in the presence of the patient. It is hard to have a successful appointment when the patient has memory impairment and is alone at appointments.

## 2016-01-05 ENCOUNTER — Ambulatory Visit (INDEPENDENT_AMBULATORY_CARE_PROVIDER_SITE_OTHER): Payer: Medicare Other | Admitting: Physician Assistant

## 2016-01-05 ENCOUNTER — Encounter: Payer: Self-pay | Admitting: Physician Assistant

## 2016-01-05 ENCOUNTER — Other Ambulatory Visit: Payer: Self-pay | Admitting: Physician Assistant

## 2016-01-05 VITALS — BP 152/88 | HR 72 | Temp 97.8°F | Resp 16 | Ht 62.0 in | Wt 124.4 lb

## 2016-01-05 DIAGNOSIS — I10 Essential (primary) hypertension: Secondary | ICD-10-CM | POA: Diagnosis not present

## 2016-01-05 DIAGNOSIS — F418 Other specified anxiety disorders: Secondary | ICD-10-CM | POA: Diagnosis not present

## 2016-01-05 DIAGNOSIS — F329 Major depressive disorder, single episode, unspecified: Secondary | ICD-10-CM

## 2016-01-05 DIAGNOSIS — F419 Anxiety disorder, unspecified: Principal | ICD-10-CM

## 2016-01-05 MED ORDER — PAROXETINE HCL 20 MG PO TABS: ORAL_TABLET | ORAL | Status: DC

## 2016-01-05 NOTE — Telephone Encounter (Signed)
Spoke with patient and discussed medications, as daughter is in charge of patient's Rx schedule; she has D/C Potassium & Propanolol, which were not to be stopped [only the HCTZ on the Lisinopril]; daughter also stated that she did not get refill for Paxil, informed her that we would remedy this at pt's OV today [01/05/16], also Melatonin D/C from pt's med list, as daughter states that pt has no trouble with sleep. Pt's daughter informed that provder would like her to be at pt's next Office Visit, as she is the primary caregiver and controls pt's medications given; understood & agreed. Provider informed prior to pt's OV/SLS 05/15

## 2016-01-05 NOTE — Progress Notes (Signed)
Pre visit review using our clinic review tool, if applicable. No additional management support is needed unless otherwise documented below in the visit note/SLS  

## 2016-01-05 NOTE — Telephone Encounter (Signed)
Refill sent per [facility] refill protocol/SLs Paxil DUPLICATE REQUEST, SENT DURING 01/05/16 OFFICE VISIT

## 2016-01-05 NOTE — Progress Notes (Signed)
Patient presents to clinic today for follow-up of blood pressure. At last visit, BP noted to be hypotensive. She was taken off of Prinzide and placed on a comparable dose of plain lisinopril. She was instructed to continue Propranolol. Instructions were relayed to her daughter via phone as her daughter handles medications due to patient having cognitive impairment. Patient states she thinks she is taking medications as directed. Patient denies chest pain, palpitations, lightheadedness, dizziness, vision changes or frequent headaches.  BP Readings from Last 3 Encounters:  01/05/16 152/88  12/10/15 108/60  11/13/14 136/86   Patient also following up on mood. Is taking her Paxil as directed along with Aricept for memory. Endorses doing well overall. She would like some more of her freedoms back (drving, managing finances) but understands the need to have her daughter involved. Denies SI/HI.    Past Medical History  Diagnosis Date  . Hypertension   . Arthritis     OA  . 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 Macular degeneration   . Anxiety   . Depression   . Migraine     Hx of  . Cerumen impaction     recurrent Lt  . Peripheral neuropathy [facility])     Current Outpatient Prescriptions on File Prior to Visit  Medication Sig Dispense Refill  . Calcium Carbonate-Vitamin D 600-400 MG-UNIT per tablet Take 1 tablet by mouth 2 (two) times daily. Reported on 12/10/2015    . donepezil (ARICEPT) 5 MG tablet Take 1 tablet (5 mg total) by mouth at bedtime. 30 tablet 3  . lisinopril (PRINIVIL,ZESTRIL) 20 MG tablet Take 0.5 tablets (10 mg total) by mouth 2 (two) times daily. 60 tablet 3  . loperamide (IMODIUM A-D) 2 MG tablet Take 2 mg by mouth 4 (four) times daily as needed.      Marland Kitchen PARoxetine (PAXIL) 20 MG tablet TAKE 1/2 TABLET BY MOUTH EVERY MORNING AND 1/2 TABLET EACH EVENING 30 tablet 3  . potassium chloride (KLOR-CON 10) 10 MEQ tablet TAKE 1 TABLET (10 MEQ  TOTAL) BY MOUTH DAILY. 90 tablet 3  . propranolol (INDERAL) 60 MG tablet TAKE 2 TABLETS (120 MG TOTAL) BY MOUTH 2 (TWO) TIMES DAILY. 120 tablet 0   No current facility-administered medications on file prior to visit.    Allergies  Allergen Reactions  . Ampicillin     REACTION: reaction not known  . Cefaclor     REACTION: diarrhea  . Codeine     REACTION: diarrhea  . Duloxetine     REACTION: severe diarrhea  . Fexofenadine     REACTION: fuzzy  . Ibuprofen     REACTION: GI  . Loratadine     REACTION: blurred vision  . Nsaids     REACTION: fatigue    Family History  Problem Relation Age of Onset  . Stroke Mother   . Hypertension Mother   . Alcohol abuse Father   . Heart disease Father   . Kidney disease Father     dialysis  . Hypertension Brother   . Hypertension Daughter   . Cancer Maternal Aunt     colon CA  . Cancer Maternal Uncle     liver CA    Social History   Social History  . Marital Status: Widowed    Spouse Name: N/A  . Number of Children: N/A  . Years of Education: N/A   Social History Main Topics  . Smoking status: Former Smoker  Quit date: 08/24/1983  . Smokeless tobacco: Never Used  . Alcohol Use: No  . Drug Use: No  . Sexual Activity: Not Asked   Other Topics Concern  . None   Social History Narrative   Review of Systems - See HPI.  All other ROS are negative.  BP 152/88 mmHg  Pulse 72  Temp(Src) 97.8 F (36.6 C) (Oral)  Resp 16  Ht '5\' 2"'$  (1.575 m)  Wt 124 lb 6 oz (56.416 kg)  BMI 22.74 kg/m2  SpO2 99%  Physical Exam  Constitutional: She is well-developed, well-nourished, and in no distress.  HENT:  Head: Normocephalic and atraumatic.  Eyes: Conjunctivae are normal.  Cardiovascular: Normal rate, regular rhythm, normal heart sounds and intact distal pulses.   Pulmonary/Chest: Effort normal and breath sounds normal. No respiratory distress. She has no wheezes. She has no rales. She exhibits no tenderness.  Neurological:    Alert and oriented to person.   Skin: Skin is warm and dry. No rash noted.  Psychiatric: Affect normal.  Vitals reviewed.   Recent Results (from the past 2160 hour(s))  Comp Met (CMET)     Status: Abnormal   Collection Time: 12/10/15  4:00 PM  Result Value Ref Range   Sodium 142 135 - 145 mEq/L   Potassium 3.5 3.5 - 5.1 mEq/L   Chloride 106 96 - 112 mEq/L   CO2 28 19 - 32 mEq/L   Glucose, Bld 90 70 - 99 mg/dL   BUN 22 6 - 23 mg/dL   Creatinine, Ser 1.07 0.40 - 1.20 mg/dL   Total Bilirubin 0.4 0.2 - 1.2 mg/dL   Alkaline Phosphatase 58 39 - 117 U/L   AST 21 0 - 37 U/L   ALT 18 0 - 35 U/L   Total Protein 6.8 6.0 - 8.3 g/dL   Albumin 4.5 3.5 - 5.2 g/dL   Calcium 9.8 8.4 - 10.5 mg/dL   GFR 52.34 (L) >60.00 mL/min  TSH     Status: None   Collection Time: 12/10/15  4:00 PM  Result Value Ref Range   TSH 0.83 0.35 - 4.50 uIU/mL    Assessment/Plan: 1. Anxiety and depression Doing well. Will continue current regimen. FU 6 montths  2. Essential hypertension Elevated today. Unfortunately she is dropped off at appointments by her driver. Daughter manages medications and has not been present at last 2 visits. Giving patient's memory issues, this makes effective visits almost impossible. She thinks she is taking medications as directed at last visit but BP elevated. Spoke with daughter via phone to discuss with her the need to have someone else present at visits. Spoke to her regarding BP today and daughter notes she thought she was also supposed to stop mother's propranolol so has not been giving to her every day. Reviewed current regimen for her to restart. FU 2 weeks for reassessment.

## 2016-01-05 NOTE — Telephone Encounter (Signed)
Daughter called back stating she is unable to attend appointment today and would like to speak with nurse or PCP. Please advise

## 2016-01-05 NOTE — Telephone Encounter (Signed)
Spoke with daughter. Correct medication reviewed. She will make sure medications are handled. FU 4 weeks.

## 2016-01-05 NOTE — Patient Instructions (Addendum)
Please continue the lisinopril as directed.  Make sure you restart your Propranolol taking as directed as well. This should not have been stopped at last visit. I will be calling your daughter to make her aware as well.   Follow-up in 3-4 weeks for a nurse visit to recheck your BP.

## 2016-01-09 ENCOUNTER — Ambulatory Visit: Payer: Medicare Other | Admitting: Physician Assistant

## 2016-01-22 DIAGNOSIS — H34812 Central retinal vein occlusion, left eye, with macular edema: Secondary | ICD-10-CM | POA: Diagnosis not present

## 2016-02-02 ENCOUNTER — Other Ambulatory Visit: Payer: Self-pay | Admitting: Physician Assistant

## 2016-02-02 NOTE — Telephone Encounter (Signed)
Refill sent per [facility] refill protocol/SLS  

## 2016-02-03 ENCOUNTER — Ambulatory Visit (INDEPENDENT_AMBULATORY_CARE_PROVIDER_SITE_OTHER): Payer: Medicare Other | Admitting: Physician Assistant

## 2016-02-03 VITALS — BP 171/59 | HR 57

## 2016-02-03 DIAGNOSIS — I10 Essential (primary) hypertension: Secondary | ICD-10-CM | POA: Diagnosis not present

## 2016-02-03 NOTE — Patient Instructions (Addendum)
Per Elyn Aquas, PA-C: Increase Lisinopril to 1 tablet (20 mg) in the morning & half tablet (10 mg) in the evening. Continue same regimen with all other medications. Follow-up with PCP in 1 week for office visit. If you experience chest pain, dizziness or any other symptoms prior to appointment, please refer to the emergency department.

## 2016-02-03 NOTE — Progress Notes (Signed)
Pre visit review using our clinic review tool, if applicable. No additional management support is needed unless otherwise documented below in the visit note.  Patient presents in clinic for blood pressure check per OV note on 01/05/16. She is accompanied by her daughter. Today's readings were as follow: BP 167/54 P 56 & BP 171/59 P 57. Patient is asymptomatic at this time.  Per Elyn Aquas, PA-C: Increase Lisinopril to 1 tablet (20 mg) by mouth in the morning & half tablet (10 mg) in the evening. Continue same regimen with all other medications. Follow-up with PCP in 1 week for office visit. If you experience chest pain, dizziness or any other symptoms prior to this appointment, please refer to the emergency department. Informed patient and her daughter of the provider's recommendations. They both verbalized understanding. Patient did not have any questions or concerns prior to leaving the visit.  Next appointment scheduled for 02/10/16 at 2:15 PM.

## 2016-02-03 NOTE — Progress Notes (Signed)
Plan is as noted below. Will see patient at follow-up.

## 2016-02-09 ENCOUNTER — Telehealth: Payer: Self-pay | Admitting: Physician Assistant

## 2016-02-09 NOTE — Telephone Encounter (Signed)
°  Relationship to patient: Daughter [name]) Can be reached: [phone]   Reason for call: Daughter [name] wishes to speak directly with provider prior to appointment tomorrow.

## 2016-02-09 NOTE — Telephone Encounter (Signed)
Discussed previously with daughter that I will not always be able to call her prior to her mother's appointment, especially with less than 24 hour notice. I am in clinic until 5 PM today. Please call to assess her concerns. I have advised she or another family member start coming to the appointments as Suzanne Monroe is not a good historian due to her dementia.

## 2016-02-09 NOTE — Telephone Encounter (Signed)
Thank you :)

## 2016-02-09 NOTE — Telephone Encounter (Signed)
Spoke w/ [name]. She reports that d/t her mother's dementia, she gets extremely agitated and combative with her. [name] feels that her agitation contributes to her elevated BP readings at the office, and therefore feels that she cannot attend pt's appointments with her. She is conflicted, because she is involved in her mom's care and manages medications and says "I wish I could come", but states that it "just won't work" because her mom now sees her as the "controller" of her life and is harboring anger towards her because of it. Last week when she brought pt to BP check, states mother was angry and combative the whole ride here and then "puts on a different face" during the appt. She reports that the patient has been taking all of her medications as directed by [facility] and has had no problems. Pt will be brought to tomorrow's appt by Glendell Docker from the [employer]. [name] would like a second copy of AVS to be mailed to address on file, as she states the pt will not want to share her copy with her. I have made note in appt notes to mail AVS, and I will be happy to follow-up w/ [name] after appt tomorrow if needed.

## 2016-02-10 ENCOUNTER — Encounter: Payer: Self-pay | Admitting: Physician Assistant

## 2016-02-10 ENCOUNTER — Ambulatory Visit (INDEPENDENT_AMBULATORY_CARE_PROVIDER_SITE_OTHER): Payer: Medicare Other | Admitting: Physician Assistant

## 2016-02-10 VITALS — BP 140/80 | HR 54 | Temp 97.9°F | Resp 16 | Ht 62.0 in | Wt 126.2 lb

## 2016-02-10 DIAGNOSIS — I10 Essential (primary) hypertension: Secondary | ICD-10-CM

## 2016-02-10 MED ORDER — HYDROCHLOROTHIAZIDE 12.5 MG PO TABS: 12.5000 mg | ORAL_TABLET | Freq: Every day | ORAL | Status: DC

## 2016-02-10 NOTE — Progress Notes (Signed)
Pre visit review using our clinic review tool, if applicable. No additional management support is needed unless otherwise documented below in the visit note/SLS  

## 2016-02-10 NOTE — Patient Instructions (Addendum)
Your BP was elevated on first check today but is much improved on recheck after letting you relax a few minutes.  I am happy with the 140/80 today. Please continue taking Lisinopril as directed -- 1 tablet in the AM and 1/2 tablet in PM. Continue the metoprolol as directed.  Follow the diet below.  Follow-up with me in 2 months. Return sooner if needed.  DASH Eating Plan DASH stands for "Dietary Approaches to Stop Hypertension." The DASH eating plan is a healthy eating plan that has been shown to reduce high blood pressure (hypertension). Additional health benefits may include reducing the risk of type 2 diabetes mellitus, heart disease, and stroke. The DASH eating plan may also help with weight loss. WHAT DO I NEED TO KNOW ABOUT THE DASH EATING PLAN? For the DASH eating plan, you will follow these general guidelines:  Choose foods with a percent daily value for sodium of less than 5% (as listed on the food label).  Use salt-free seasonings or herbs instead of table salt or sea salt.  Check with your health care provider or pharmacist before using salt substitutes.  Eat lower-sodium products, often labeled as "lower sodium" or "no salt added."  Eat fresh foods.  Eat more vegetables, fruits, and low-fat dairy products.  Choose whole grains. Look for the word "whole" as the first word in the ingredient list.  Choose fish and skinless chicken or [place] more often than red meat. Limit fish, poultry, and meat to 6 oz (170 g) each day.  Limit sweets, desserts, sugars, and sugary drinks.  Choose heart-healthy fats.  Limit cheese to 1 oz (28 g) per day.  Eat more home-cooked food and less restaurant, buffet, and fast food.  Limit fried foods.  Cook foods using methods other than frying.  Limit canned vegetables. If you do use them, rinse them well to decrease the sodium.  When eating at a restaurant, ask that your food be prepared with less salt, or no salt if possible. WHAT  FOODS CAN I EAT? Seek help from a dietitian for individual calorie needs. Grains Whole grain or whole wheat bread. Brown rice. Whole grain or whole wheat pasta. Quinoa, bulgur, and whole grain cereals. Low-sodium cereals. Corn or whole wheat flour tortillas. Whole grain cornbread. Whole grain crackers. Low-sodium crackers. Vegetables Fresh or frozen vegetables (raw, steamed, roasted, or grilled). Low-sodium or reduced-sodium tomato and vegetable juices. Low-sodium or reduced-sodium tomato sauce and paste. Low-sodium or reduced-sodium canned vegetables.  Fruits All fresh, canned (in natural juice), or frozen fruits. Meat and Other Protein Products Ground beef (85% or leaner), grass-fed beef, or beef trimmed of fat. Skinless chicken or [place]. Ground chicken or [place]. Pork trimmed of fat. All fish and seafood. Eggs. Dried beans, peas, or lentils. Unsalted nuts and seeds. Unsalted canned beans. Dairy Low-fat dairy products, such as skim or 1% milk, 2% or reduced-fat cheeses, low-fat ricotta or cottage cheese, or plain low-fat yogurt. Low-sodium or reduced-sodium cheeses. Fats and Oils Tub margarines without trans fats. Light or reduced-fat mayonnaise and salad dressings (reduced sodium). Avocado. Safflower, olive, or canola oils. Natural peanut or almond butter. Other Unsalted popcorn and pretzels. The items listed above may not be a complete list of recommended foods or beverages. Contact your dietitian for more options. WHAT FOODS ARE NOT RECOMMENDED? Grains White bread. White pasta. White rice. Refined cornbread. Bagels and croissants. Crackers that contain trans fat. Vegetables Creamed or fried vegetables. Vegetables in a cheese sauce. Regular canned vegetables. Regular canned tomato  sauce and paste. Regular tomato and vegetable juices. Fruits Dried fruits. Canned fruit in light or heavy syrup. Fruit juice. Meat and Other Protein Products Fatty cuts of meat. Ribs, chicken wings, bacon,  sausage, bologna, salami, chitterlings, fatback, hot dogs, bratwurst, and packaged luncheon meats. Salted nuts and seeds. Canned beans with salt. Dairy Whole or 2% milk, cream, half-and-half, and cream cheese. Whole-fat or sweetened yogurt. Full-fat cheeses or blue cheese. Nondairy creamers and whipped toppings. Processed cheese, cheese spreads, or cheese curds. Condiments Onion and garlic salt, seasoned salt, table salt, and sea salt. Canned and packaged gravies. Worcestershire sauce. Tartar sauce. Barbecue sauce. Teriyaki sauce. Soy sauce, including reduced sodium. Steak sauce. Fish sauce. Oyster sauce. Cocktail sauce. Horseradish. Ketchup and mustard. Meat flavorings and tenderizers. Bouillon cubes. Hot sauce. Tabasco sauce. Marinades. Taco seasonings. Relishes. Fats and Oils Butter, stick margarine, lard, shortening, ghee, and bacon fat. Coconut, palm kernel, or palm oils. Regular salad dressings. Other Pickles and olives. Salted popcorn and pretzels. The items listed above may not be a complete list of foods and beverages to avoid. Contact your dietitian for more information. WHERE CAN I FIND MORE INFORMATION? National Heart, Lung, and Blood Institute: travelstabloid.com   This information is not intended to replace advice given to you by your health care provider. Make sure you discuss any questions you have with your health care provider.   Document Released: 07/29/2011 Document Revised: 08/30/2014 Document Reviewed: 06/13/2013 Elsevier Interactive Patient Education [employer].  .

## 2016-02-10 NOTE — Progress Notes (Signed)
Patient presents to clinic today for follow-up of hypertension. At BP check 1 week prior, BP elevated in 170s/80s. Lisinopril was increased to 20 mg AM and 10 mg PM.  Daughter endorses patient is taking medications as directed. Patient denies chest pain, palpitations, lightheadedness, dizziness, vision changes or frequent headaches.  BP Readings from Last 3 Encounters:  02/10/16 140/80  02/03/16 171/59  01/05/16 152/88    Past Medical History  Diagnosis Date  . Hypertension   . Arthritis     OA  . 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 Macular degeneration   . Anxiety   . Depression   . Migraine     Hx of  . Cerumen impaction     recurrent Lt  . Peripheral neuropathy [facility])     Current Outpatient Prescriptions on File Prior to Visit  Medication Sig Dispense Refill  . Calcium Carbonate-Vitamin D 600-400 MG-UNIT per tablet Take 1 tablet by mouth 2 (two) times daily. Reported on 12/10/2015    . donepezil (ARICEPT) 5 MG tablet Take 1 tablet (5 mg total) by mouth at bedtime. 30 tablet 3  . lisinopril (PRINIVIL,ZESTRIL) 20 MG tablet Take 0.5 tablets (10 mg total) by mouth 2 (two) times daily. (Patient taking differently: Take 20 mg by mouth 2 (two) times daily. Take 1 tablet (20 mg) by mouth each morning and 1/2 tablet (10 mg) each evening.) 60 tablet 3  . PARoxetine (PAXIL) 20 MG tablet TAKE 1/2 TABLET BY MOUTH EVERY MORNING AND 1/2 TABLET EACH EVENING 30 tablet 3  . propranolol (INDERAL) 60 MG tablet TAKE 2 TABLETS (120 MG TOTAL) BY MOUTH 2 (TWO) TIMES DAILY. 120 tablet 2   No current facility-administered medications on file prior to visit.    Allergies  Allergen Reactions  . Ampicillin     REACTION: reaction not known  . Cefaclor     REACTION: diarrhea  . Codeine     REACTION: diarrhea  . Duloxetine     REACTION: severe diarrhea  . Fexofenadine     REACTION: fuzzy  . Ibuprofen     REACTION: GI  . Loratadine     REACTION: blurred  vision  . Nsaids     REACTION: fatigue    Family History  Problem Relation Age of Onset  . Stroke Mother   . Hypertension Mother   . Alcohol abuse Father   . Heart disease Father   . Kidney disease Father     dialysis  . Hypertension Brother   . Hypertension Daughter   . Cancer Maternal Aunt     colon CA  . Cancer Maternal Uncle     liver CA    Social History   Social History  . Marital Status: Widowed    Spouse Name: N/A  . Number of Children: N/A  . Years of Education: N/A   Social History Main Topics  . Smoking status: Former Smoker    Quit date: 08/24/1983  . Smokeless tobacco: Never Used  . Alcohol Use: No  . Drug Use: No  . Sexual Activity: Not Asked   Other Topics Concern  . None   Social History Narrative   Review of Systems - See HPI.  All other ROS are negative.  BP 140/80 mmHg  Pulse 54  Temp(Src) 97.9 F (36.6 C) (Oral)  Resp 16  Ht '5\' 2"'$  (1.575 m)  Wt 126 lb 4 oz (57.267 kg)  BMI 23.09 kg/m2  SpO2 98%  Physical Exam  Constitutional: She is oriented to person, place, and time and well-developed, well-nourished, and in no distress.  HENT:  Head: Normocephalic and atraumatic.  Eyes: Conjunctivae are normal.  Cardiovascular: Normal rate, regular rhythm, normal heart sounds and intact distal pulses.   Pulmonary/Chest: Effort normal and breath sounds normal. No respiratory distress. She has no wheezes. She has no rales. She exhibits no tenderness.  Neurological: She is alert and oriented to person, place, and time.  Skin: Skin is warm and dry. No rash noted.  Psychiatric: Affect normal.  Vitals reviewed.   Recent Results (from the past 2160 hour(s))  Comp Met (CMET)     Status: Abnormal   Collection Time: 12/10/15  4:00 PM  Result Value Ref Range   Sodium 142 135 - 145 mEq/L   Potassium 3.5 3.5 - 5.1 mEq/L   Chloride 106 96 - 112 mEq/L   CO2 28 19 - 32 mEq/L   Glucose, Bld 90 70 - 99 mg/dL   BUN 22 6 - 23 mg/dL   Creatinine, Ser 1.07  0.40 - 1.20 mg/dL   Total Bilirubin 0.4 0.2 - 1.2 mg/dL   Alkaline Phosphatase 58 39 - 117 U/L   AST 21 0 - 37 U/L   ALT 18 0 - 35 U/L   Total Protein 6.8 6.0 - 8.3 g/dL   Albumin 4.5 3.5 - 5.2 g/dL   Calcium 9.8 8.4 - 10.5 mg/dL   GFR 52.34 (L) >60.00 mL/min  TSH     Status: None   Collection Time: 12/10/15  4:00 PM  Result Value Ref Range   TSH 0.83 0.35 - 4.50 uIU/mL    Assessment/Plan: Essential hypertension Repeat BP much improved. I am happy with the 140/80. Will continue current regimen. DASH diet. FU 2 months. Return sooner if needed.     Leeanne Rio, PA-C

## 2016-02-10 NOTE — Assessment & Plan Note (Signed)
Repeat BP much improved. I am happy with the 140/80. Will continue current regimen. DASH diet. FU 2 months. Return sooner if needed.

## 2016-04-03 ENCOUNTER — Other Ambulatory Visit: Payer: Self-pay | Admitting: Physician Assistant

## 2016-04-05 NOTE — Telephone Encounter (Signed)
Rx request to pharmacy/SLS Requested drug refills are authorized, however, the patient needs further evaluation and/or laboratory testing before further refills are given. Ask her to make an appointment for this.  Please call patient and scheduled F/U appointment per provider/SLS 08/14

## 2016-04-07 NOTE — Telephone Encounter (Signed)
Appointment scheduled.

## 2016-04-09 ENCOUNTER — Telehealth: Payer: Self-pay | Admitting: Physician Assistant

## 2016-04-09 NOTE — Telephone Encounter (Signed)
Could not leave message on patient's VM to reschedule appointment for Monday 8/21. Left message w/grandson for patient to call back

## 2016-04-12 ENCOUNTER — Ambulatory Visit: Payer: Medicare Other | Admitting: Physician Assistant

## 2016-04-19 ENCOUNTER — Ambulatory Visit: Payer: Medicare Other | Admitting: Physician Assistant

## 2016-04-21 ENCOUNTER — Ambulatory Visit (INDEPENDENT_AMBULATORY_CARE_PROVIDER_SITE_OTHER): Payer: Medicare Other | Admitting: Physician Assistant

## 2016-04-21 VITALS — BP 180/80 | HR 60 | Temp 98.3°F | Wt 126.0 lb

## 2016-04-21 DIAGNOSIS — I10 Essential (primary) hypertension: Secondary | ICD-10-CM

## 2016-04-21 MED ORDER — LISINOPRIL 20 MG PO TABS: 20.0000 mg | ORAL_TABLET | Freq: Two times a day (BID) | ORAL | 3 refills | Status: DC

## 2016-04-21 NOTE — Patient Instructions (Signed)
Please continue the Propranolol as directed. Increase the lisinopril to 1 tablet (20 mg) each morning and 1 tablet (20 mg ) each evening.  Do not add salt to your food. Stay hydrated. I will call with your lab results. You will follow-up with me in 1 week.  If you notice any headaches, chest pain, dizziness, please call 911.   DASH Eating Plan DASH stands for "Dietary Approaches to Stop Hypertension." The DASH eating plan is a healthy eating plan that has been shown to reduce high blood pressure (hypertension). Additional health benefits may include reducing the risk of type 2 diabetes mellitus, heart disease, and stroke. The DASH eating plan may also help with weight loss. WHAT DO I NEED TO KNOW ABOUT THE DASH EATING PLAN? For the DASH eating plan, you will follow these general guidelines:  Choose foods with a percent daily value for sodium of less than 5% (as listed on the food label).  Use salt-free seasonings or herbs instead of table salt or sea salt.  Check with your health care provider or pharmacist before using salt substitutes.  Eat lower-sodium products, often labeled as "lower sodium" or "no salt added."  Eat fresh foods.  Eat more vegetables, fruits, and low-fat dairy products.  Choose whole grains. Look for the word "whole" as the first word in the ingredient list.  Choose fish and skinless chicken or [place] more often than red meat. Limit fish, poultry, and meat to 6 oz (170 g) each day.  Limit sweets, desserts, sugars, and sugary drinks.  Choose heart-healthy fats.  Limit cheese to 1 oz (28 g) per day.  Eat more home-cooked food and less restaurant, buffet, and fast food.  Limit fried foods.  Cook foods using methods other than frying.  Limit canned vegetables. If you do use them, rinse them well to decrease the sodium.  When eating at a restaurant, ask that your food be prepared with less salt, or no salt if possible. WHAT FOODS CAN I EAT? Seek help from  a dietitian for individual calorie needs. Grains Whole grain or whole wheat bread. Brown rice. Whole grain or whole wheat pasta. Quinoa, bulgur, and whole grain cereals. Low-sodium cereals. Corn or whole wheat flour tortillas. Whole grain cornbread. Whole grain crackers. Low-sodium crackers. Vegetables Fresh or frozen vegetables (raw, steamed, roasted, or grilled). Low-sodium or reduced-sodium tomato and vegetable juices. Low-sodium or reduced-sodium tomato sauce and paste. Low-sodium or reduced-sodium canned vegetables.  Fruits All fresh, canned (in natural juice), or frozen fruits. Meat and Other Protein Products Ground beef (85% or leaner), grass-fed beef, or beef trimmed of fat. Skinless chicken or [place]. Ground chicken or [place]. Pork trimmed of fat. All fish and seafood. Eggs. Dried beans, peas, or lentils. Unsalted nuts and seeds. Unsalted canned beans. Dairy Low-fat dairy products, such as skim or 1% milk, 2% or reduced-fat cheeses, low-fat ricotta or cottage cheese, or plain low-fat yogurt. Low-sodium or reduced-sodium cheeses. Fats and Oils Tub margarines without trans fats. Light or reduced-fat mayonnaise and salad dressings (reduced sodium). Avocado. Safflower, olive, or canola oils. Natural peanut or almond butter. Other Unsalted popcorn and pretzels. The items listed above may not be a complete list of recommended foods or beverages. Contact your dietitian for more options. WHAT FOODS ARE NOT RECOMMENDED? Grains White bread. White pasta. White rice. Refined cornbread. Bagels and croissants. Crackers that contain trans fat. Vegetables Creamed or fried vegetables. Vegetables in a cheese sauce. Regular canned vegetables. Regular canned tomato sauce and paste. Regular tomato  and vegetable juices. Fruits Dried fruits. Canned fruit in light or heavy syrup. Fruit juice. Meat and Other Protein Products Fatty cuts of meat. Ribs, chicken wings, bacon, sausage, bologna, salami,  chitterlings, fatback, hot dogs, bratwurst, and packaged luncheon meats. Salted nuts and seeds. Canned beans with salt. Dairy Whole or 2% milk, cream, half-and-half, and cream cheese. Whole-fat or sweetened yogurt. Full-fat cheeses or blue cheese. Nondairy creamers and whipped toppings. Processed cheese, cheese spreads, or cheese curds. Condiments Onion and garlic salt, seasoned salt, table salt, and sea salt. Canned and packaged gravies. Worcestershire sauce. Tartar sauce. Barbecue sauce. Teriyaki sauce. Soy sauce, including reduced sodium. Steak sauce. Fish sauce. Oyster sauce. Cocktail sauce. Horseradish. Ketchup and mustard. Meat flavorings and tenderizers. Bouillon cubes. Hot sauce. Tabasco sauce. Marinades. Taco seasonings. Relishes. Fats and Oils Butter, stick margarine, lard, shortening, ghee, and bacon fat. Coconut, palm kernel, or palm oils. Regular salad dressings. Other Pickles and olives. Salted popcorn and pretzels. The items listed above may not be a complete list of foods and beverages to avoid. Contact your dietitian for more information. WHERE CAN I FIND MORE INFORMATION? National Heart, Lung, and Blood Institute: travelstabloid.com   This information is not intended to replace advice given to you by your health care provider. Make sure you discuss any questions you have with your health care provider.   Document Released: 07/29/2011 Document Revised: 08/30/2014 Document Reviewed: 06/13/2013 Elsevier Interactive Patient Education [employer].

## 2016-04-21 NOTE — Progress Notes (Signed)
Patient presents to clinic today for follow-up of hypertension. At last visit patient's BP was well controlled with Propranolol 120 mg BID and Lisinopril 20 mg AM and 10 mg PM. Patient endorses taking medications as directed mostly but sometimes forgets nighttime dosing. Has taken today as directed. Patient denies chest pain, palpitations, lightheadedness, dizziness, vision changes or frequent headaches.  BP Readings from Last 3 Encounters:  04/21/16 (!) 180/80  02/10/16 140/80  02/03/16 (!) 171/59   .  Past Medical History:  Diagnosis Date  . Anxiety   . Arthritis    OA  . Cerumen impaction    recurrent Lt  . Depression   . 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 Hypertension   . Macular degeneration   . Migraine    Hx of  . Peripheral neuropathy ([place])     Current Outpatient Prescriptions on File Prior to Visit  Medication Sig Dispense Refill  . Calcium Carbonate-Vitamin D 600-400 MG-UNIT per tablet Take 1 tablet by mouth 2 (two) times daily. Reported on 12/10/2015    . donepezil (ARICEPT) 5 MG tablet TAKE 1 TABLET (5 MG TOTAL) BY MOUTH AT BEDTIME. 30 tablet 3  . PARoxetine (PAXIL) 20 MG tablet TAKE 1/2 TABLET BY MOUTH EVERY MORNING AND 1/2 TABLET EACH EVENING 30 tablet 3  . propranolol (INDERAL) 60 MG tablet TAKE 2 TABLETS (120 MG TOTAL) BY MOUTH 2 (TWO) TIMES DAILY. 120 tablet 2   No current facility-administered medications on file prior to visit.     Allergies  Allergen Reactions  . Ampicillin     REACTION: reaction not known  . Cefaclor     REACTION: diarrhea  . Codeine     REACTION: diarrhea  . Duloxetine     REACTION: severe diarrhea  . Fexofenadine     REACTION: fuzzy  . Ibuprofen     REACTION: GI  . Loratadine     REACTION: blurred vision  . Nsaids     REACTION: fatigue    Family History  Problem Relation Age of Onset  . Stroke Mother   . Hypertension Mother   . Alcohol abuse Father   . Heart disease Father   .  Kidney disease Father     dialysis  . Hypertension Brother   . Hypertension Daughter   . Cancer Maternal Aunt     colon CA  . Cancer Maternal Uncle     liver CA    Social History   Social History  . Marital status: Widowed    Spouse name: N/A  . Number of children: N/A  . Years of education: N/A   Social History Main Topics  . Smoking status: Former Smoker    Quit date: 08/24/1983  . Smokeless tobacco: Never Used  . Alcohol use No  . Drug use: No  . Sexual activity: Not on file   Other Topics Concern  . Not on file   Social History Narrative  . No narrative on file    Review of Systems - See HPI.  All other ROS are negative.  BP (!) 180/80   Pulse 60   Temp 98.3 F (36.8 C)   Wt 126 lb (57.2 kg)   SpO2 99%   BMI 23.05 kg/m   Physical Exam  Constitutional: She is oriented to person, place, and time and well-developed, well-nourished, and in no distress.  HENT:  Head: Normocephalic and atraumatic.  Eyes: Conjunctivae are normal.  Neck: Neck  supple.  Cardiovascular: Normal rate, regular rhythm, normal heart sounds and intact distal pulses.   Pulmonary/Chest: Effort normal and breath sounds normal. No respiratory distress. She has no wheezes. She has no rales. She exhibits no tenderness.  Neurological: She is alert and oriented to person, place, and time.  Skin: Skin is warm and dry. No rash noted.  Psychiatric: Affect normal.  Vitals reviewed.  Assessment/Plan: 1. Essential hypertension Has previously been controlled. I think salt content of diet is contributing. DASH diet discussed. Will continue propranolol. Will increase lisinopril to 1 tablet BID. Will check labs today. FU 1 week. Alarm signs/symptoms dicussed that would prompt ER assessment. Patient and daughter voice understanding and agreement with plan.  - Comp Met (CMET) - Lipid panel   Leeanne Rio, PA-C

## 2016-04-22 LAB — COMPREHENSIVE METABOLIC PANEL
ALBUMIN: 4.1 g/dL (ref 3.5–5.2)
ALT: 15 U/L (ref 0–35)
AST: 19 U/L (ref 0–37)
Alkaline Phosphatase: 74 U/L (ref 39–117)
BUN: 21 mg/dL (ref 6–23)
CALCIUM: 8.9 mg/dL (ref 8.4–10.5)
CHLORIDE: 107 meq/L (ref 96–112)
CO2: 28 mEq/L (ref 19–32)
Creatinine, Ser: 0.79 mg/dL (ref 0.40–1.20)
GFR: 74.21 mL/min (ref >60.00)
Glucose, Bld: 85 mg/dL (ref 70–99)
POTASSIUM: 4.3 meq/L (ref 3.5–5.1)
Sodium: 140 mEq/L (ref 135–145)
Total Bilirubin: 0.4 mg/dL (ref 0.2–1.2)
Total Protein: 6.1 g/dL (ref 6.0–8.3)

## 2016-04-22 LAB — LIPID PANEL
CHOL/HDL RATIO: 3
CHOLESTEROL: 166 mg/dL (ref 0–200)
HDL: 54.2 mg/dL (ref >39.00)
NonHDL: 112.02
TRIGLYCERIDES: 291 mg/dL — AB (ref 0.0–149.0)
VLDL: 58.2 mg/dL — AB (ref 0.0–40.0)

## 2016-04-22 LAB — LDL CHOLESTEROL, DIRECT: Direct LDL: 81 mg/dL

## 2016-04-23 ENCOUNTER — Telehealth: Payer: Self-pay | Admitting: Physician Assistant

## 2016-04-23 NOTE — Telephone Encounter (Signed)
Caller name: [name]  Relationship to patient: Daughter  Can be reached: [phone]   Reason for call: Daughter wanted to inform provider that patient's BP was 160/80 today. States that provider wanted her to report BP

## 2016-04-24 NOTE — Telephone Encounter (Signed)
Thank her for letting us know.  Would have her continue the changes in BP medication as they can take some time to make a bigger impact.  They should be following up in office this week for repeat assessment. We will make further changes at that time if necessary.

## 2016-04-27 NOTE — Telephone Encounter (Signed)
Called pt's daughter, [name], and left message to return call. Pt has follow-up appt scheduled tomorrow.

## 2016-04-28 ENCOUNTER — Ambulatory Visit (INDEPENDENT_AMBULATORY_CARE_PROVIDER_SITE_OTHER): Payer: Medicare Other | Admitting: Physician Assistant

## 2016-04-28 ENCOUNTER — Encounter: Payer: Self-pay | Admitting: Physician Assistant

## 2016-04-28 VITALS — BP 128/78 | HR 55 | Temp 97.9°F | Resp 16 | Ht 62.0 in | Wt 126.2 lb

## 2016-04-28 DIAGNOSIS — I10 Essential (primary) hypertension: Secondary | ICD-10-CM | POA: Diagnosis not present

## 2016-04-28 NOTE — Assessment & Plan Note (Signed)
BP stable. Doing very well. Asymptomatic. Will continue current regimen. Will check BMP today. FU 3 months. Will continue weekly BP checks at the ALF.

## 2016-04-28 NOTE — Progress Notes (Signed)
Patient presents to clinic today for 1 week follow-up of hypertension. At last visit, medication regimen was changed with lisinopril increased to 20 mg BID. Patient endorses taking as directed. Patient denies chest pain, palpitations, lightheadedness, dizziness, vision changes or frequent headaches.  BP Readings from Last 3 Encounters:  04/28/16 128/78  04/21/16 (!) 180/80  02/10/16 140/80    Past Medical History:  Diagnosis Date  . Anxiety   . Arthritis    OA  . Cerumen impaction    recurrent Lt  . Depression   . 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 Hypertension   . Macular degeneration   . Migraine    Hx of  . Peripheral neuropathy ([place])     Current Outpatient Prescriptions on File Prior to Visit  Medication Sig Dispense Refill  . Calcium Carbonate-Vitamin D 600-400 MG-UNIT per tablet Take 1 tablet by mouth 2 (two) times daily. Reported on 12/10/2015    . donepezil (ARICEPT) 5 MG tablet TAKE 1 TABLET (5 MG TOTAL) BY MOUTH AT BEDTIME. 30 tablet 3  . lisinopril (PRINIVIL,ZESTRIL) 20 MG tablet Take 1 tablet (20 mg total) by mouth 2 (two) times daily. 60 tablet 3  . PARoxetine (PAXIL) 20 MG tablet TAKE 1/2 TABLET BY MOUTH EVERY MORNING AND 1/2 TABLET EACH EVENING 30 tablet 3  . propranolol (INDERAL) 60 MG tablet TAKE 2 TABLETS (120 MG TOTAL) BY MOUTH 2 (TWO) TIMES DAILY. 120 tablet 2   No current facility-administered medications on file prior to visit.     Allergies  Allergen Reactions  . Ampicillin     REACTION: reaction not known  . Cefaclor     REACTION: diarrhea  . Codeine     REACTION: diarrhea  . Duloxetine     REACTION: severe diarrhea  . Fexofenadine     REACTION: fuzzy  . Ibuprofen     REACTION: GI  . Loratadine     REACTION: blurred vision  . Nsaids     REACTION: fatigue    Family History  Problem Relation Age of Onset  . Stroke Mother   . Hypertension Mother   . Alcohol abuse Father   . Heart disease Father    . Kidney disease Father     dialysis  . Hypertension Brother   . Hypertension Daughter   . Cancer Maternal Aunt     colon CA  . Cancer Maternal Uncle     liver CA    Social History   Social History  . Marital status: Widowed    Spouse name: N/A  . Number of children: N/A  . Years of education: N/A   Social History Main Topics  . Smoking status: Former Smoker    Quit date: 08/24/1983  . Smokeless tobacco: Never Used  . Alcohol use No  . Drug use: No  . Sexual activity: Not Asked   Other Topics Concern  . None   Social History Narrative  . None    Review of Systems - See HPI.  All other ROS are negative.  BP 128/78 (BP Location: Left Arm, Patient Position: Sitting, Cuff Size: Normal)   Pulse (!) 55   Temp 97.9 F (36.6 C) (Oral)   Resp 16   Ht '5\' 2"'$  (1.575 m)   Wt 126 lb 4 oz (57.3 kg)   SpO2 98%   BMI 23.09 kg/m   Physical Exam  Constitutional: She is oriented to person, place, and time and well-developed,  well-nourished, and in no distress.  HENT:  Head: Normocephalic and atraumatic.  Cardiovascular: Normal rate, regular rhythm, normal heart sounds and intact distal pulses.   Pulmonary/Chest: Effort normal and breath sounds normal. No respiratory distress. She has no wheezes. She has no rales. She exhibits no tenderness.  Neurological: She is alert and oriented to person, place, and time.  Skin: Skin is warm and dry. No rash noted.  Psychiatric: Affect normal.  Vitals reviewed.   Recent Results (from the past 2160 hour(s))  Comp Met (CMET)     Status: None   Collection Time: 04/21/16  4:04 PM  Result Value Ref Range   Sodium 140 135 - 145 mEq/L   Potassium 4.3 3.5 - 5.1 mEq/L   Chloride 107 96 - 112 mEq/L   CO2 28 19 - 32 mEq/L   Glucose, Bld 85 70 - 99 mg/dL   BUN 21 6 - 23 mg/dL   Creatinine, Ser 0.79 0.40 - 1.20 mg/dL   Total Bilirubin 0.4 0.2 - 1.2 mg/dL   Alkaline Phosphatase 74 39 - 117 U/L   AST 19 0 - 37 U/L   ALT 15 0 - 35 U/L   Total  Protein 6.1 6.0 - 8.3 g/dL   Albumin 4.1 3.5 - 5.2 g/dL   Calcium 8.9 8.4 - 10.5 mg/dL   GFR 74.21 >60.00 mL/min  Lipid panel     Status: Abnormal   Collection Time: 04/21/16  4:04 PM  Result Value Ref Range   Cholesterol 166 0 - 200 mg/dL    Comment: ATP III Classification       Desirable:  < 200 mg/dL               Borderline High:  200 - 239 mg/dL          High:  > = 240 mg/dL   Triglycerides 291.0 (H) 0.0 - 149.0 mg/dL    Comment: Normal:  <150 mg/dLBorderline High:  150 - 199 mg/dL   HDL 54.20 >39.00 mg/dL   VLDL 58.2 (H) 0.0 - 40.0 mg/dL   Total CHOL/HDL Ratio 3     Comment:                Men          Women1/2 Average Risk     3.4          3.3Average Risk          5.0          4.42X Average Risk          9.6          7.13X Average Risk          15.0          11.0                       NonHDL 112.02     Comment: NOTE:  Non-HDL goal should be 30 mg/dL higher than patient's LDL goal (i.e. LDL goal of < 70 mg/dL, would have non-HDL goal of < 100 mg/dL)  LDL cholesterol, direct     Status: None   Collection Time: 04/21/16  4:04 PM  Result Value Ref Range   Direct LDL 81.0 mg/dL    Comment: Optimal:  <100 mg/dLNear or Above Optimal:  100-129 mg/dLBorderline High:  130-159 mg/dLHigh:  160-189 mg/dLVery High:  >190 mg/dL    Assessment/Plan: Essential hypertension BP stable. Doing very well. Asymptomatic. Will continue  current regimen. Will check BMP today. FU 3 months. Will continue weekly BP checks at the ALF.     Leeanne Rio, PA-C

## 2016-04-28 NOTE — Patient Instructions (Signed)
Please continue medications as directed.  Watch the salt!  Follow-up with me in 3 months. Return sooner if needed.  I am glad you are feeling well. Take care!

## 2016-04-29 LAB — BASIC METABOLIC PANEL
BUN: 21 mg/dL (ref 6–23)
CALCIUM: 8.8 mg/dL (ref 8.4–10.5)
CO2: 30 meq/L (ref 19–32)
CREATININE: 0.92 mg/dL (ref 0.40–1.20)
Chloride: 108 mEq/L (ref 96–112)
GFR: 62.24 mL/min (ref >60.00)
Glucose, Bld: 102 mg/dL — ABNORMAL HIGH (ref 70–99)
Potassium: 4.3 mEq/L (ref 3.5–5.1)
SODIUM: 142 meq/L (ref 135–145)

## 2016-05-15 ENCOUNTER — Other Ambulatory Visit: Payer: Self-pay | Admitting: Physician Assistant

## 2016-05-15 DIAGNOSIS — Z23 Encounter for immunization: Secondary | ICD-10-CM | POA: Diagnosis not present

## 2016-07-07 ENCOUNTER — Telehealth: Payer: Self-pay | Admitting: Physician Assistant

## 2016-07-07 NOTE — Telephone Encounter (Signed)
Caller name: Faye Ramsay Relationship to patient: daughter Can be reached: [phone] Email: [email or web address]  Reason for call: [name] came in office to setup appt with new provider as she will not be able to take pt to [facility]. Appt scheduled with Dr. Lorelei Pont. [name] states pt has dementia and does not have a good memory so she usually comes with her. She stated she would like to talk to/communicate with Dr. Lorelei Pont prior to appt in December. She gave me her email address or stated ok to call her.

## 2016-07-07 NOTE — Telephone Encounter (Signed)
ok 

## 2016-07-07 NOTE — Telephone Encounter (Signed)
Ok to make the switch 

## 2016-07-25 ENCOUNTER — Other Ambulatory Visit: Payer: Self-pay | Admitting: Physician Assistant

## 2016-07-26 NOTE — Telephone Encounter (Signed)
Refill sent per [facility] refill protocol/SLS  

## 2016-08-04 ENCOUNTER — Ambulatory Visit: Payer: Medicare Other | Admitting: Physician Assistant

## 2016-08-12 ENCOUNTER — Ambulatory Visit (INDEPENDENT_AMBULATORY_CARE_PROVIDER_SITE_OTHER): Payer: Medicare Other | Admitting: Family Medicine

## 2016-08-12 ENCOUNTER — Encounter: Payer: Self-pay | Admitting: Family Medicine

## 2016-08-12 VITALS — BP 191/74 | HR 56 | Temp 98.2°F | Ht 62.0 in | Wt 126.6 lb

## 2016-08-12 DIAGNOSIS — F418 Other specified anxiety disorders: Secondary | ICD-10-CM | POA: Diagnosis not present

## 2016-08-12 DIAGNOSIS — F0390 Unspecified dementia without behavioral disturbance: Secondary | ICD-10-CM | POA: Diagnosis not present

## 2016-08-12 DIAGNOSIS — F419 Anxiety disorder, unspecified: Secondary | ICD-10-CM

## 2016-08-12 DIAGNOSIS — R296 Repeated falls: Secondary | ICD-10-CM | POA: Diagnosis not present

## 2016-08-12 DIAGNOSIS — I10 Essential (primary) hypertension: Secondary | ICD-10-CM

## 2016-08-12 DIAGNOSIS — F329 Major depressive disorder, single episode, unspecified: Secondary | ICD-10-CM

## 2016-08-12 MED ORDER — PAROXETINE HCL 20 MG PO TABS: ORAL_TABLET | ORAL | 3 refills | Status: DC

## 2016-08-12 MED ORDER — LISINOPRIL 40 MG PO TABS: 40.0000 mg | ORAL_TABLET | Freq: Every day | ORAL | 3 refills | Status: DC

## 2016-08-12 NOTE — Progress Notes (Signed)
[place] at [facility] [address], [place], [place] [address] [phone]  Date:  08/12/2016   Name:  Suzanne Monroe   DOB:  01-31-1933   MRN:  [id number]  PCP:  Leeanne Rio, PA-C    Chief Complaint: Follow-up (Pt here for 3 month follow up on HTN. )   History of Present Illness:  Suzanne Monroe is a 82 y.o. very pleasant female patient who presents with the following:  Here today to establish care- she is a former pt of Einar Pheasant who has moved out to [place] [employer] suffers from some dementia and is supported by her daughter who lives locally.  Tryphena herself denies any dementia and expresses her frustration that her family tries to control her, and her opinion that she is able to get by independently still  BP Readings from Last 3 Encounters:  08/12/16 (!) 191/74  04/28/16 128/78  04/21/16 (!) 180/80   She is on lisinopril and propranolol for her HTN- however noted that her BP is still quite hgih today She does notice some sinus headaches She has dementia and lives at the [facility]. She is in an independent apt with significant support from her family   She was born in [place] and moved around a lot while her husband was in the air force She has 2 daughters and 6 grands, 3 great- grands.   Her daughter prepares her pill box once a week- there are days that she does not necessarily take her medication per her daughters reprot  She moved back to [place] about about 2 years ago She has had several falls over the last few months but has not gotten seriously injuryed She does uses her walker some when she is going for a longer walks- when she uses her cane she may trip and have more falls.    Denies any CP, SOB, or HA  Wt Readings from Last 3 Encounters:  08/12/16 126 lb 9.6 oz (57.4 kg)  04/28/16 126 lb 4 oz (57.3 kg)  04/21/16 126 lb (57.2 kg)    Patient Active Problem List   Diagnosis Date Noted  . Essential hypertension  10/28/2014  . Hyperlipidemia 10/28/2014  . Leg swelling 10/28/2014  . Central retinal vein occlusion 10/28/2014  . Irregular heartbeat 10/28/2014  . Nausea alone 02/19/2014  . Other malaise and fatigue 02/19/2014  . Fibromyalgia 05/15/2013  . Low TSH level 05/07/2013  . Memory loss 05/07/2013  . Hypokalemia 05/07/2013  . Frequent falls 05/07/2013  . Stress reaction 05/01/2013  . Leg skin lesion, left 01/24/2013  . Neck injury 04/20/2011  . ANXIETY 11/11/2008  . MENOPAUSAL SYNDROME 04/24/2008  . Anxiety and depression 10/30/2007  . DYSPEPSIA 10/30/2007  . HYPERCHOLESTEROLEMIA, PURE 03/30/2007  . ICHTHYOSIS 03/29/2007  . MIGRAINE, CHRONIC 11/03/2006  . IRRITABLE BOWEL SYNDROME 11/03/2006  . FIBROCYSTIC BREAST DISEASE 11/03/2006  . OSTEOARTHRITIS 11/03/2006  . POLYMYALGIA RHEUMATICA 11/03/2006  . ANA POSITIVE, HX OF 11/03/2006  . PERIPHERAL NEUROPATHY 12/21/2000  . COLITIS, ULCERATIVE NOS 08/23/1993    Past Medical History:  Diagnosis Date  . Anxiety   . Arthritis    OA  . Cerumen impaction    recurrent Lt  . Depression   . 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 Hypertension   . Macular degeneration   . Migraine    Hx of  . Peripheral neuropathy ([place])  Past Surgical History:  Procedure Laterality Date  . APPENDECTOMY    . CHOLECYSTECTOMY    . RHINOPLASTY    . SHOULDER SURGERY     left shoulder replacement    Social History  Substance Use Topics  . Smoking status: Former Smoker    Quit date: 08/24/1983  . Smokeless tobacco: Never Used  . Alcohol use No    Family History  Problem Relation Age of Onset  . Stroke Mother   . Hypertension Mother   . Alcohol abuse Father   . Heart disease Father   . Kidney disease Father     dialysis  . Hypertension Brother   . Hypertension Daughter   . Cancer Maternal Aunt     colon CA  . Cancer Maternal Uncle     liver CA    Allergies  Allergen Reactions  . Ampicillin      REACTION: reaction not known  . Cefaclor     REACTION: diarrhea  . Codeine     REACTION: diarrhea  . Duloxetine     REACTION: severe diarrhea  . Fexofenadine     REACTION: fuzzy  . Ibuprofen     REACTION: GI  . Loratadine     REACTION: blurred vision  . Nsaids     REACTION: fatigue    Medication list has been reviewed and updated.  Current Outpatient Prescriptions on File Prior to Visit  Medication Sig Dispense Refill  . Calcium Carbonate-Vitamin D 600-400 MG-UNIT per tablet Take 1 tablet by mouth 2 (two) times daily. Reported on 12/10/2015    . donepezil (ARICEPT) 5 MG tablet TAKE 1 TABLET (5 MG TOTAL) BY MOUTH AT BEDTIME. 30 tablet 3  . lisinopril (PRINIVIL,ZESTRIL) 20 MG tablet TAKE 1/2 TABLET (10 MG TOTAL) BY MOUTH 2 (TWO) TIMES DAILY. 60 tablet 3  . PARoxetine (PAXIL) 20 MG tablet TAKE 1/2 TABLET BY MOUTH EVERY MORNING AND 1/2 TABLET EACH EVENING 30 tablet 3  . propranolol (INDERAL) 60 MG tablet TAKE 2 TABLETS (120 MG TOTAL) BY MOUTH 2 (TWO) TIMES DAILY. 120 tablet 2   No current facility-administered medications on file prior to visit.     Review of Systems:  As per HPI- otherwise negative.   Physical Examination: Vitals:   08/12/16 1700 08/12/16 1705  BP: (!) 193/71 (!) 191/74  Pulse: (!) 56   Temp: 98.2 F (36.8 C)    Vitals:   08/12/16 1700  Weight: 126 lb 9.6 oz (57.4 kg)  Height: 5\' 2"  (1.575 m)   Body mass index is 23.16 kg/m. Ideal Body Weight: Weight in (lb) to have BMI = 25: 136.4  GEN: WDWN, NAD, Non-toxic, A & O x 3, normal weight.   Well groomed and accompanied by her daughter and grand-daughter HEENT: Atraumatic, Normocephalic. Neck supple. No masses, No LAD. Ears and Nose: No external deformity. CV: RRR, No M/G/R. No JVD. No thrill. No extra heart sounds. PULM: CTA B, no wheezes, crackles, rhonchi. No retractions. No resp. distress. No accessory muscle use. EXTR: No c/c/e NEURO Normal gait.  PSYCH: Normally interactive. Conversant. Not  depressed or anxious appearing.  Calm demeanor.  It is clear that she does have memory deficit   Assessment and Plan: Uncontrolled hypertension - Plan: lisinopril (PRINIVIL,ZESTRIL) 40 MG tablet  Anxiety and depression - Plan: PARoxetine (PAXIL) 20 MG tablet  Dementia without behavioral disturbance, unspecified dementia type  Frequent falls    Here today to establish care Her BP is quite hight today Right now she  is taking lisinopril 20 in 2 divided doses.  They would prefer to have her on just one pill daily for ease of use.  Will increase to 40 mg once a day, continue inderal Refilled her paxil,  She is also on aricept Encouraged PT to work on her balance and help avoid falls- she will think about it   Signed Lamar Blinks, MD

## 2016-08-12 NOTE — Progress Notes (Signed)
Pre visit review using our clinic review tool, if applicable. No additional management support is needed unless otherwise documented below in the visit note. 

## 2016-08-12 NOTE — Patient Instructions (Addendum)
I would agree that your walker is a good idea to help prevent falls  If you are able to get some PT sessions at the [facility] I would also encourage this to work on your balance We are going to increase her lisinopril to 40 mg once a day; I hope that this will help control your blood pressure  Please keep me posted about your blood pressure readings, and let's plan to meet in about 3 months

## 2016-08-23 ENCOUNTER — Other Ambulatory Visit: Payer: Self-pay | Admitting: Physician Assistant

## 2016-10-29 ENCOUNTER — Telehealth: Payer: Self-pay | Admitting: Family Medicine

## 2016-10-29 NOTE — Telephone Encounter (Signed)
Caller name:lisa garrett Relation to JS:[id number] Call back number: [phone] Pharmacy:  Reason for call: pt's daughter states she is needing proper documentation for pt's insurance company [employer], stating that pt's has memory loss/dementia, daughter is trying to get her into [facility] is stating that in order for approval for long term she would have to be dx with the dementia. Please contact pt when available for pick up

## 2016-10-29 NOTE — Telephone Encounter (Signed)
Called back and [facility] for Suzanne Monroe- I am glad to try and help but do not know exactly what is needed.  Please call us back with more detail.  Do they need a form completed?

## 2016-10-29 NOTE — Telephone Encounter (Signed)
Pt's daughter dropped of FL-2 forms for Dr. Lorelei Pont to complete, documents placed in tray at front office

## 2016-11-10 ENCOUNTER — Ambulatory Visit (INDEPENDENT_AMBULATORY_CARE_PROVIDER_SITE_OTHER): Payer: Medicare Other | Admitting: Family Medicine

## 2016-11-10 VITALS — BP 222/60 | HR 55 | Temp 98.1°F | Ht 62.0 in | Wt 128.2 lb

## 2016-11-10 DIAGNOSIS — I10 Essential (primary) hypertension: Secondary | ICD-10-CM

## 2016-11-10 DIAGNOSIS — R296 Repeated falls: Secondary | ICD-10-CM | POA: Diagnosis not present

## 2016-11-10 DIAGNOSIS — F0390 Unspecified dementia without behavioral disturbance: Secondary | ICD-10-CM

## 2016-11-10 DIAGNOSIS — Z111 Encounter for screening for respiratory tuberculosis: Secondary | ICD-10-CM

## 2016-11-10 MED ORDER — AMLODIPINE BESYLATE 5 MG PO TABS: 5.0000 mg | ORAL_TABLET | Freq: Every day | ORAL | 3 refills | Status: DC

## 2016-11-10 NOTE — Progress Notes (Signed)
[place] at [facility] [address] [place], [place] [address] [phone]  Date:  11/10/2016   Name:  Suzanne Monroe   DOB:  09-07-1932   MRN:  [id number]  PCP:  Lamar Blinks, MD    Chief Complaint: Follow-up   History of Present Illness:  Suzanne Monroe is a 83 y.o. very pleasant female patient who presents with the following:  Last visit here about 3 months ago- recheck today. Concern of elevated BP at her last visit  She is on lisinopril and propranolol for her HTN- however noted that her BP is still quite hgih today She does notice some sinus headaches She has dementia and lives at the [facility]. She is in an independent apt with significant support from her family   She was born in [place] and moved around a lot while her husband was in the air force She has 2 daughters and 6 grands, 3 great- grands.   Her daughter prepares her pill box once a week- there are days that she does not necessarily take her medication per her daughters reprot  She moved back to [place] about about 2 years ago She has had several falls over the last few months but has not gotten seriously injuryed She does uses her walker some when she is going for a longer walks- when she uses her cane she may trip and have more falls.   She is pretty complaint with her BP meds as far as we know   Her mother had a long history of HTN., and so do most member of her family.  She is currently living in an independent apt at the [facility], but they plan to move her to [facility] which offers assisted living. Suzanne Monroe is ok with this move.  She is here today with her daughter Judson Roch who provides much of the history Judson Roch notes that her mom needs the higher level of care available through AL- namely help with her medications.  She is not sure if her mom is taking her BP meds all the time and is not really sure how often she may be missing her dose.  She wonders if this is part of why  we have such a hard time controlling her BP Chitara denies any CP, SOB or headache.  She walks daily for about 45 minutes.  She is eager to get her BP under better control "so I won't have to worry about it.  Maricsa does use a cane or walker to help prevent falls.  She has some memory loss but is able to attend to her own grooming, dressing and eating independentally   Patient Active Problem List   Diagnosis Date Noted  . Essential hypertension 10/28/2014  . Hyperlipidemia 10/28/2014  . Central retinal vein occlusion 10/28/2014  . Irregular heartbeat 10/28/2014  . Fibromyalgia 05/15/2013  . Low TSH level 05/07/2013  . Memory loss 05/07/2013  . Hypokalemia 05/07/2013  . Frequent falls 05/07/2013  . MENOPAUSAL SYNDROME 04/24/2008  . Anxiety and depression 10/30/2007  . DYSPEPSIA 10/30/2007  . HYPERCHOLESTEROLEMIA, PURE 03/30/2007  . ICHTHYOSIS 03/29/2007  . MIGRAINE, CHRONIC 11/03/2006  . IRRITABLE BOWEL SYNDROME 11/03/2006  . OSTEOARTHRITIS 11/03/2006  . POLYMYALGIA RHEUMATICA 11/03/2006  . PERIPHERAL NEUROPATHY 12/21/2000  . COLITIS, ULCERATIVE NOS 08/23/1993    Past Medical History:  Diagnosis Date  . Anxiety   . Arthritis    OA  . Cerumen impaction    recurrent Lt  .  Depression   . 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 Hypertension   . Macular degeneration   . Migraine    Hx of  . Peripheral neuropathy ([place])     Past Surgical History:  Procedure Laterality Date  . APPENDECTOMY    . CHOLECYSTECTOMY    . RHINOPLASTY    . SHOULDER SURGERY     left shoulder replacement    Social History  Substance Use Topics  . Smoking status: Former Smoker    Quit date: 08/24/1983  . Smokeless tobacco: Never Used  . Alcohol use No    Family History  Problem Relation Age of Onset  . Stroke Mother   . Hypertension Mother   . Alcohol abuse Father   . Heart disease Father   . Kidney disease Father     dialysis  . Hypertension Brother   .  Hypertension Daughter   . Cancer Maternal Aunt     colon CA  . Cancer Maternal Uncle     liver CA    Allergies  Allergen Reactions  . Ampicillin     REACTION: reaction not known  . Cefaclor     REACTION: diarrhea  . Codeine     REACTION: diarrhea  . Duloxetine     REACTION: severe diarrhea  . Fexofenadine     REACTION: fuzzy  . Ibuprofen     REACTION: GI  . Loratadine     REACTION: blurred vision  . Nsaids     REACTION: fatigue    Medication list has been reviewed and updated.  Current Outpatient Prescriptions on File Prior to Visit  Medication Sig Dispense Refill  . Calcium Carbonate-Vitamin D 600-400 MG-UNIT per tablet Take 1 tablet by mouth 2 (two) times daily. Reported on 12/10/2015    . donepezil (ARICEPT) 5 MG tablet TAKE 1 TABLET (5 MG TOTAL) BY MOUTH AT BEDTIME. 30 tablet 3  . lisinopril (PRINIVIL,ZESTRIL) 40 MG tablet Take 1 tablet (40 mg total) by mouth daily. 90 tablet 3  . PARoxetine (PAXIL) 20 MG tablet TAKE 1/2 TABLET BY MOUTH EVERY MORNING AND 1/2 TABLET EACH EVENING 90 tablet 3  . propranolol (INDERAL) 60 MG tablet TAKE 2 TABLETS (120 MG TOTAL) BY MOUTH 2 (TWO) TIMES DAILY. 120 tablet 2   No current facility-administered medications on file prior to visit.     Review of Systems:  As per HPI- otherwise negative.   Physical Examination: Vitals:   11/10/16 1550  BP: (!) 222/60  Pulse: (!) 55  Temp: 98.1 F (36.7 C)   Vitals:   11/10/16 1550  Weight: 128 lb 3.2 oz (58.2 kg)  Height: 5\' 2"  (1.575 m)   Body mass index is 23.45 kg/m. Ideal Body Weight: Weight in (lb) to have BMI = 25: 136.4  GEN: WDWN, NAD, Non-toxic, A & O x 3, well dressed and groomed as always, looks well HEENT: Atraumatic, Normocephalic. Neck supple. No masses, No LAD. Ears and Nose: No external deformity. CV: RRR, No M/G/R. No JVD. No thrill. No extra heart sounds. PULM: CTA B, no wheezes, crackles, rhonchi. No retractions. No resp. distress. No accessory muscle  use. ABD: S, NT, ND EXTR: No c/c/e NEURO Normal gait.  PSYCH: Normally interactive. Conversant. Not depressed or anxious appearing.  Calm demeanor.  In casual conversation one would not be aware of any memory problems in this pt    Assessment and Plan: Essential hypertension - Plan: amLODipine (NORVASC) 5 MG tablet  Screening for tuberculosis - Plan: Quantiferon tb gold assay  Dementia without behavioral disturbance, unspecified dementia type  Frequent falls  Uncontrolled HTN- will add 5 mg of amlodipine to her regimen.  They will call with updated readings in about one week  Completed FL2 form for her today and gave to her daughter Draw quantiferon gold as TB testing may be required for her move- they plan to have her move in about 2 weeks   Signed Lamar Blinks, MD

## 2016-11-10 NOTE — Patient Instructions (Addendum)
It was very nice to see you today!  Take care and please let me know how your blood pressure looks with the addition of the 5mg  of amlodipine - if you would, contact me with some readings in the next 5-7 days We will do a blood test to screen for TB today- I will be in touch with your results asap

## 2016-11-12 LAB — QUANTIFERON TB GOLD ASSAY (BLOOD)
Interferon Gamma Release Assay: NEGATIVE
MITOGEN-NIL SO: 6.35 [IU]/mL
Quantiferon Nil Value: 0.04 IU/mL
Quantiferon Tb Ag Minus Nil Value: 0.02 IU/mL

## 2016-11-25 DIAGNOSIS — R296 Repeated falls: Secondary | ICD-10-CM | POA: Diagnosis not present

## 2016-11-25 DIAGNOSIS — I10 Essential (primary) hypertension: Secondary | ICD-10-CM | POA: Diagnosis not present

## 2016-11-25 DIAGNOSIS — R413 Other amnesia: Secondary | ICD-10-CM | POA: Diagnosis not present

## 2016-11-25 DIAGNOSIS — Z0000 Encounter for general adult medical examination without abnormal findings: Secondary | ICD-10-CM | POA: Diagnosis not present

## 2016-11-25 DIAGNOSIS — F0390 Unspecified dementia without behavioral disturbance: Secondary | ICD-10-CM | POA: Diagnosis not present

## 2016-12-09 DIAGNOSIS — F0390 Unspecified dementia without behavioral disturbance: Secondary | ICD-10-CM | POA: Diagnosis not present

## 2016-12-09 DIAGNOSIS — E559 Vitamin D deficiency, unspecified: Secondary | ICD-10-CM | POA: Diagnosis not present

## 2016-12-09 DIAGNOSIS — R413 Other amnesia: Secondary | ICD-10-CM | POA: Diagnosis not present

## 2016-12-09 DIAGNOSIS — I10 Essential (primary) hypertension: Secondary | ICD-10-CM | POA: Diagnosis not present

## 2016-12-09 DIAGNOSIS — Z0000 Encounter for general adult medical examination without abnormal findings: Secondary | ICD-10-CM | POA: Diagnosis not present

## 2016-12-09 DIAGNOSIS — E119 Type 2 diabetes mellitus without complications: Secondary | ICD-10-CM | POA: Diagnosis not present

## 2016-12-16 DIAGNOSIS — R413 Other amnesia: Secondary | ICD-10-CM | POA: Diagnosis not present

## 2016-12-16 DIAGNOSIS — I10 Essential (primary) hypertension: Secondary | ICD-10-CM | POA: Diagnosis not present

## 2017-01-13 DIAGNOSIS — Z0000 Encounter for general adult medical examination without abnormal findings: Secondary | ICD-10-CM | POA: Diagnosis not present

## 2017-01-13 DIAGNOSIS — R413 Other amnesia: Secondary | ICD-10-CM | POA: Diagnosis not present

## 2017-01-13 DIAGNOSIS — R296 Repeated falls: Secondary | ICD-10-CM | POA: Diagnosis not present

## 2017-01-13 DIAGNOSIS — I10 Essential (primary) hypertension: Secondary | ICD-10-CM | POA: Diagnosis not present

## 2017-01-13 DIAGNOSIS — F0390 Unspecified dementia without behavioral disturbance: Secondary | ICD-10-CM | POA: Diagnosis not present

## 2017-01-18 DIAGNOSIS — R296 Repeated falls: Secondary | ICD-10-CM | POA: Diagnosis not present

## 2017-01-18 DIAGNOSIS — F0390 Unspecified dementia without behavioral disturbance: Secondary | ICD-10-CM | POA: Diagnosis not present

## 2017-01-18 DIAGNOSIS — I10 Essential (primary) hypertension: Secondary | ICD-10-CM | POA: Diagnosis not present

## 2017-01-18 DIAGNOSIS — R413 Other amnesia: Secondary | ICD-10-CM | POA: Diagnosis not present

## 2017-01-27 DIAGNOSIS — R4182 Altered mental status, unspecified: Secondary | ICD-10-CM | POA: Diagnosis not present

## 2017-01-27 DIAGNOSIS — M25512 Pain in left shoulder: Secondary | ICD-10-CM | POA: Diagnosis not present

## 2017-02-16 DIAGNOSIS — R300 Dysuria: Secondary | ICD-10-CM | POA: Diagnosis not present

## 2017-02-16 DIAGNOSIS — R296 Repeated falls: Secondary | ICD-10-CM | POA: Diagnosis not present

## 2017-02-16 DIAGNOSIS — F0390 Unspecified dementia without behavioral disturbance: Secondary | ICD-10-CM | POA: Diagnosis not present

## 2017-02-16 DIAGNOSIS — I10 Essential (primary) hypertension: Secondary | ICD-10-CM | POA: Diagnosis not present

## 2017-02-16 DIAGNOSIS — R413 Other amnesia: Secondary | ICD-10-CM | POA: Diagnosis not present

## 2017-03-01 ENCOUNTER — Telehealth: Payer: Self-pay | Admitting: Family Medicine

## 2017-03-01 NOTE — Telephone Encounter (Signed)
Caller name: Faye Ramsay Relation to pt: daughter  Call back number: [phone]    Reason for call:    As per daughter patient cant live on her own due to memory loss and would like to admit her into assisting living, patient scheduled for follow up for 03/10/17 with PCP.   Daughter states [facility] will fax over forms to [phone] Attention Dr. Lorelei Pont.

## 2017-03-01 NOTE — Telephone Encounter (Signed)
Caller name: Cyril Mourning  Relation to pt: Care Coordinator from [facility]  Call back [place] fax # [phone]    Reason for call:  Requesting office notes from 11/10/16

## 2017-03-02 NOTE — Telephone Encounter (Signed)
Office note from 11/10/16 faxed to [facility] from [facility] as requested.

## 2017-03-02 NOTE — Telephone Encounter (Signed)
Have not received as of yet, will check new paperwork in the morning/SLS 07/11 2:49pm

## 2017-03-03 NOTE — Telephone Encounter (Signed)
Received Clinical Review Form from [facility]; pt has appt 03/10/17 with PCP, forwarded to provider. LMOM with contact name and number [for return call, if needed] RE: paperwork received from [place] [facility] and if further paperwork is expected from [facility] to please give them a call/SLS 07/12

## 2017-03-08 NOTE — Telephone Encounter (Addendum)
Faye Ramsay (daughter) returning call and would like to speak with a nurse regarding patient dementia and paperwork mentioned below prior to 03/10/17 appointment.   Daughter requesting a call back before 10am or after 12noon due to meetings, please advise

## 2017-03-08 NOTE — Progress Notes (Signed)
[place] at [facility] [address] [place], [place] [address] [phone]  Date:  03/10/2017   Name:  Suzanne Monroe   DOB:  11-03-1932   MRN:  [id number]  PCP:  Darreld Mclean, MD    Chief Complaint: Annual Exam (Pt here for yearly CPE )   History of Present Illness:  Suzanne Monroe is a 83 y.o. very pleasant female patient who presents with the following:  Last seen by myself in March of this year:  Her mother had a long history of HTN., and so do most member of her family.  She is currently living in an independent apt at the [facility], but they plan to move her to [facility] which offers assisted living. Saraya is ok with this move.  She is here today with her daughter Judson Roch who provides much of the history Judson Roch notes that her mom needs the higher level of care available through AL- namely help with her medications.  She is not sure if her mom is taking her BP meds all the time and is not really sure how often she may be missing her dose.  She wonders if this is part of why we have such a hard time controlling her BP Tyona denies any CP, SOB or headache.  She walks daily for about 45 minutes.  She is eager to get her BP under better control "so I won't have to worry about it.  Qiara does use a cane or walker to help prevent falls.  She has some memory loss but is able to attend to her own grooming, dressing and eating independentally   Pulse Readings from Last 3 Encounters:  11/10/16 (!) 55  08/12/16 (!) 56  04/28/16 (!) 55   Here today for a recheck visit See phone call from her daughter Judson Roch yesterday Also needs paperwork filled out for her long term care insurance so they will cover her assisted living facility. I have spoken to Judson Roch about this issue and will complete paperwork today  Last labs about a year ago- she would like to do these today  She does a lot of walking for exercising- she walks a couple of times a day  for an hour at each session She had just a banana this am  She is feeling well Her energy level is "good for my age." however sometimes she does feel kind of tired  She likes [facility] well- she feels that the food is good.  There are some activities to do but she prefers to walk. 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 Essential hypertension 10/28/2014  . Hyperlipidemia 10/28/2014  . Central retinal vein occlusion 10/28/2014  . Irregular heartbeat 10/28/2014  . Fibromyalgia 05/15/2013  . Low TSH level 05/07/2013  . Memory loss 05/07/2013  . Hypokalemia 05/07/2013  . Frequent falls 05/07/2013  . Anxiety and depression 10/30/2007  . DYSPEPSIA  10/30/2007  . HYPERCHOLESTEROLEMIA, PURE 03/30/2007  . ICHTHYOSIS 03/29/2007  . MIGRAINE, CHRONIC 11/03/2006  . IRRITABLE BOWEL SYNDROME 11/03/2006  . OSTEOARTHRITIS 11/03/2006  . POLYMYALGIA RHEUMATICA 11/03/2006  . PERIPHERAL NEUROPATHY 12/21/2000  . COLITIS, ULCERATIVE NOS 08/23/1993    Past Medical History:  Diagnosis Date  . Anxiety   . Arthritis    OA  . Cerumen impaction    recurrent Lt  . Depression   . 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 Hypertension   . Macular degeneration   . Migraine    Hx of  . Peripheral neuropathy ([place])     Past Surgical History:  Procedure Laterality Date  . APPENDECTOMY    . CHOLECYSTECTOMY    . RHINOPLASTY    . SHOULDER SURGERY     left shoulder replacement    Social History  Substance Use Topics  . Smoking status:  Former Smoker    Quit date: 08/24/1983  . Smokeless tobacco: Never Used  . Alcohol use No    Family History  Problem Relation Age of Onset  . Stroke Mother   . Hypertension Mother   . Alcohol abuse Father   . Heart disease Father   . Kidney disease Father        dialysis  . Hypertension Brother   . Hypertension Daughter   . Cancer Maternal Aunt        colon CA  . Cancer Maternal Uncle        liver CA    Allergies  Allergen Reactions  . Ampicillin     REACTION: reaction not known  . Cefaclor     REACTION: diarrhea  . Codeine     REACTION: diarrhea  . Duloxetine     REACTION: severe diarrhea  . Fexofenadine     REACTION: fuzzy  . Ibuprofen     REACTION: GI  . Loratadine     REACTION: blurred vision  . Nsaids     REACTION: fatigue    Medication list has been reviewed and updated.  Current Outpatient Prescriptions on File Prior to Visit  Medication Sig Dispense Refill  . amLODipine (NORVASC) 5 MG tablet Take 1 tablet (5 mg total) by mouth daily. 90 tablet 3  . Calcium Carbonate-Vitamin D 600-400 MG-UNIT per tablet Take 1 tablet by mouth 2 (two) times daily. Reported on 12/10/2015    . donepezil (ARICEPT) 5 MG tablet TAKE 1 TABLET (5 MG TOTAL) BY MOUTH AT BEDTIME. 30 tablet 3  . lisinopril (PRINIVIL,ZESTRIL) 40 MG tablet Take 1 tablet (40 mg total) by mouth daily. 90 tablet 3  . PARoxetine (PAXIL) 20 MG tablet TAKE 1/2 TABLET BY MOUTH EVERY MORNING AND 1/2 TABLET EACH EVENING 90 tablet 3  . propranolol (INDERAL) 60 MG tablet TAKE 2 TABLETS (120 MG TOTAL) BY MOUTH 2 (TWO) TIMES DAILY. 120 tablet 2   No current facility-administered medications on file prior to visit.     Review of Systems:  As per HPI- otherwise negative. Denies any CP or SOB   Physical Examination: Vitals:   03/10/17 1104  BP: 130/82  Temp: 97.6 F (36.4 C)   Vitals:   03/10/17 1104  Weight: 121 lb 3.2 oz (55 kg)  Height: _0  (1.575 m)   Body mass index is 22.17 kg/m. Ideal Body  Weight: Weight in (lb) to have BMI = 25: 136.4  GEN: WDWN, NAD, Non-toxic, A & O x 3, looks well and younger than  her age 63: Atraumatic, Normocephalic. Neck supple. No masses, No LAD.  Bilateral TM wnl, oropharynx normal.  PEERL,EOMI.   Ears and Nose: No external deformity. CV: RRR, No M/G/R. No JVD. No thrill. No extra heart sounds. PULM: CTA B, no wheezes, crackles, rhonchi. No retractions. No resp. distress. No accessory muscle use. ABD: S, NT, ND, +BS. No rebound. No HSM. EXTR: No c/c/e NEURO Normal gait.  PSYCH: Normally interactive. Conversant. Not depressed or anxious appearing.  Calm demeanor.  Pt seems normal at first glance but does show evidence of dementia- cannot recall the name of her assisted living facility and has applied black eyeliner as lip liner today   Assessment and Plan: Essential hypertension - Plan: CBC, Comp Met (CMET), Lipid panel, TSH, CANCELED: Comprehensive metabolic panel  Uncontrolled hypertension - Plan: CBC, Comp Met (CMET), Lipid panel, TSH  Low TSH level - Plan: CBC, Comp Met (CMET), Lipid panel, TSH, CANCELED: TSH  Physical exam - Plan: CBC, Comp Met (CMET), Lipid panel, TSH  Dyslipidemia - Plan: CBC, Comp Met (CMET), Lipid panel, TSH, CANCELED: Lipid panel  Medication monitoring encounter - Plan: CBC, Comp Met (CMET), Lipid panel, TSH, CANCELED: CBC, CANCELED: Comprehensive metabolic panel  Dementia without behavioral disturbance, unspecified dementia type  Here today for a CPE Her pulse is a bit slow, and her BP is well controlled.  Now that her medications are provided she is likely getting them on a more regular basis then in the past. Will try reducing her propranolol to 90 BID in hopes of increasing her HR slightly Written instructions given for pt to provide to [facility] Continue amlodipine and lisinopril for now  Will plan further follow- up pending labs.   Signed Lamar Blinks, MD

## 2017-03-09 NOTE — Telephone Encounter (Signed)
Suzanne Monroe has an appt to see me tomorrow- last visit with me was in March.  [name] is afraid that her mother may deny her memory issues.  She is able to "get it together very well" in social situations and can present as more capable than she really is  She is now at [facility] in assisted living.   She has long term care insurance which now requires a statement that she cannot live on her own.  Her daughter notes that her mom can wander off and get lost, needs help with medications.  She needs a letter stating that she is unable to reside on her own which is reasonable will type this up to include with other paperwork

## 2017-03-09 NOTE — Telephone Encounter (Signed)
Daughter called again and request that paperwork must say that she is no longer able to reside on her own, [phone]

## 2017-03-10 ENCOUNTER — Ambulatory Visit (INDEPENDENT_AMBULATORY_CARE_PROVIDER_SITE_OTHER): Payer: Medicare Other | Admitting: Family Medicine

## 2017-03-10 VITALS — BP 130/82 | HR 55 | Temp 97.6°F | Ht 62.0 in | Wt 121.2 lb

## 2017-03-10 DIAGNOSIS — F0390 Unspecified dementia without behavioral disturbance: Secondary | ICD-10-CM | POA: Diagnosis not present

## 2017-03-10 DIAGNOSIS — I10 Essential (primary) hypertension: Secondary | ICD-10-CM

## 2017-03-10 DIAGNOSIS — E785 Hyperlipidemia, unspecified: Secondary | ICD-10-CM

## 2017-03-10 DIAGNOSIS — Z0000 Encounter for general adult medical examination without abnormal findings: Secondary | ICD-10-CM

## 2017-03-10 DIAGNOSIS — Z5181 Encounter for therapeutic drug level monitoring: Secondary | ICD-10-CM | POA: Diagnosis not present

## 2017-03-10 DIAGNOSIS — R7989 Other specified abnormal findings of blood chemistry: Secondary | ICD-10-CM

## 2017-03-10 DIAGNOSIS — R946 Abnormal results of thyroid function studies: Secondary | ICD-10-CM | POA: Diagnosis not present

## 2017-03-10 NOTE — Patient Instructions (Addendum)
It was a pleasure to see you today! Take care and I will be in touch with your labs asap  Please do get a mammogram at your convenience as you are due; this can be done right here at the med center.  We are going to decrease your propranolol slightly from 120 mg twice a day to 90 mg twice a day- however if your blood pressure starts running too high we can increase again

## 2017-03-17 DIAGNOSIS — I10 Essential (primary) hypertension: Secondary | ICD-10-CM | POA: Diagnosis not present

## 2017-03-17 DIAGNOSIS — Z0000 Encounter for general adult medical examination without abnormal findings: Secondary | ICD-10-CM | POA: Diagnosis not present

## 2017-03-17 DIAGNOSIS — R296 Repeated falls: Secondary | ICD-10-CM | POA: Diagnosis not present

## 2017-03-17 DIAGNOSIS — E119 Type 2 diabetes mellitus without complications: Secondary | ICD-10-CM | POA: Diagnosis not present

## 2017-03-21 DIAGNOSIS — I10 Essential (primary) hypertension: Secondary | ICD-10-CM | POA: Diagnosis not present

## 2017-03-21 DIAGNOSIS — R413 Other amnesia: Secondary | ICD-10-CM | POA: Diagnosis not present

## 2017-03-21 DIAGNOSIS — F0390 Unspecified dementia without behavioral disturbance: Secondary | ICD-10-CM | POA: Diagnosis not present

## 2017-03-21 DIAGNOSIS — R296 Repeated falls: Secondary | ICD-10-CM | POA: Diagnosis not present

## 2017-04-07 DIAGNOSIS — R413 Other amnesia: Secondary | ICD-10-CM | POA: Diagnosis not present

## 2017-04-07 DIAGNOSIS — B000 Eczema herpeticum: Secondary | ICD-10-CM | POA: Diagnosis not present

## 2017-04-07 DIAGNOSIS — I10 Essential (primary) hypertension: Secondary | ICD-10-CM | POA: Diagnosis not present

## 2017-04-07 DIAGNOSIS — Z0000 Encounter for general adult medical examination without abnormal findings: Secondary | ICD-10-CM | POA: Diagnosis not present

## 2017-04-07 DIAGNOSIS — R296 Repeated falls: Secondary | ICD-10-CM | POA: Diagnosis not present

## 2017-04-07 DIAGNOSIS — F0390 Unspecified dementia without behavioral disturbance: Secondary | ICD-10-CM | POA: Diagnosis not present

## 2017-05-12 DIAGNOSIS — I10 Essential (primary) hypertension: Secondary | ICD-10-CM | POA: Diagnosis not present

## 2017-05-12 DIAGNOSIS — R413 Other amnesia: Secondary | ICD-10-CM | POA: Diagnosis not present

## 2017-05-12 DIAGNOSIS — B000 Eczema herpeticum: Secondary | ICD-10-CM | POA: Diagnosis not present

## 2017-05-12 DIAGNOSIS — Z0000 Encounter for general adult medical examination without abnormal findings: Secondary | ICD-10-CM | POA: Diagnosis not present

## 2017-05-12 DIAGNOSIS — F0390 Unspecified dementia without behavioral disturbance: Secondary | ICD-10-CM | POA: Diagnosis not present

## 2017-05-12 DIAGNOSIS — R296 Repeated falls: Secondary | ICD-10-CM | POA: Diagnosis not present

## 2017-05-16 DIAGNOSIS — R600 Localized edema: Secondary | ICD-10-CM | POA: Diagnosis not present

## 2017-05-16 DIAGNOSIS — Z9181 History of falling: Secondary | ICD-10-CM | POA: Diagnosis not present

## 2017-05-16 DIAGNOSIS — I10 Essential (primary) hypertension: Secondary | ICD-10-CM | POA: Diagnosis not present

## 2017-05-16 DIAGNOSIS — F0390 Unspecified dementia without behavioral disturbance: Secondary | ICD-10-CM | POA: Diagnosis not present

## 2017-05-18 DIAGNOSIS — I10 Essential (primary) hypertension: Secondary | ICD-10-CM | POA: Diagnosis not present

## 2017-05-18 DIAGNOSIS — R413 Other amnesia: Secondary | ICD-10-CM | POA: Diagnosis not present

## 2017-05-18 DIAGNOSIS — L400 Psoriasis vulgaris: Secondary | ICD-10-CM | POA: Diagnosis not present

## 2017-05-18 DIAGNOSIS — B000 Eczema herpeticum: Secondary | ICD-10-CM | POA: Diagnosis not present

## 2017-05-18 DIAGNOSIS — R296 Repeated falls: Secondary | ICD-10-CM | POA: Diagnosis not present

## 2017-05-18 DIAGNOSIS — F0390 Unspecified dementia without behavioral disturbance: Secondary | ICD-10-CM | POA: Diagnosis not present

## 2017-05-19 DIAGNOSIS — F0390 Unspecified dementia without behavioral disturbance: Secondary | ICD-10-CM | POA: Diagnosis not present

## 2017-05-19 DIAGNOSIS — Z9181 History of falling: Secondary | ICD-10-CM | POA: Diagnosis not present

## 2017-05-19 DIAGNOSIS — I10 Essential (primary) hypertension: Secondary | ICD-10-CM | POA: Diagnosis not present

## 2017-05-19 DIAGNOSIS — R600 Localized edema: Secondary | ICD-10-CM | POA: Diagnosis not present

## 2017-05-26 DIAGNOSIS — R21 Rash and other nonspecific skin eruption: Secondary | ICD-10-CM | POA: Diagnosis not present

## 2017-05-26 DIAGNOSIS — F0390 Unspecified dementia without behavioral disturbance: Secondary | ICD-10-CM | POA: Diagnosis not present

## 2017-05-26 DIAGNOSIS — Z9181 History of falling: Secondary | ICD-10-CM | POA: Diagnosis not present

## 2017-05-26 DIAGNOSIS — I10 Essential (primary) hypertension: Secondary | ICD-10-CM | POA: Diagnosis not present

## 2017-05-26 DIAGNOSIS — R600 Localized edema: Secondary | ICD-10-CM | POA: Diagnosis not present

## 2017-06-02 DIAGNOSIS — Z0000 Encounter for general adult medical examination without abnormal findings: Secondary | ICD-10-CM | POA: Diagnosis not present

## 2017-06-02 DIAGNOSIS — L539 Erythematous condition, unspecified: Secondary | ICD-10-CM | POA: Diagnosis not present

## 2017-06-02 DIAGNOSIS — F0390 Unspecified dementia without behavioral disturbance: Secondary | ICD-10-CM | POA: Diagnosis not present

## 2017-06-02 DIAGNOSIS — Z9181 History of falling: Secondary | ICD-10-CM | POA: Diagnosis not present

## 2017-06-02 DIAGNOSIS — R600 Localized edema: Secondary | ICD-10-CM | POA: Diagnosis not present

## 2017-06-02 DIAGNOSIS — R413 Other amnesia: Secondary | ICD-10-CM | POA: Diagnosis not present

## 2017-06-02 DIAGNOSIS — R296 Repeated falls: Secondary | ICD-10-CM | POA: Diagnosis not present

## 2017-06-02 DIAGNOSIS — Z9119 Patient's noncompliance with other medical treatment and regimen: Secondary | ICD-10-CM | POA: Diagnosis not present

## 2017-06-02 DIAGNOSIS — I10 Essential (primary) hypertension: Secondary | ICD-10-CM | POA: Diagnosis not present

## 2017-06-02 DIAGNOSIS — B000 Eczema herpeticum: Secondary | ICD-10-CM | POA: Diagnosis not present

## 2017-06-08 DIAGNOSIS — R600 Localized edema: Secondary | ICD-10-CM | POA: Diagnosis not present

## 2017-06-08 DIAGNOSIS — Z9181 History of falling: Secondary | ICD-10-CM | POA: Diagnosis not present

## 2017-06-08 DIAGNOSIS — I10 Essential (primary) hypertension: Secondary | ICD-10-CM | POA: Diagnosis not present

## 2017-06-08 DIAGNOSIS — F0390 Unspecified dementia without behavioral disturbance: Secondary | ICD-10-CM | POA: Diagnosis not present

## 2017-06-17 DIAGNOSIS — B000 Eczema herpeticum: Secondary | ICD-10-CM | POA: Diagnosis not present

## 2017-06-17 DIAGNOSIS — I10 Essential (primary) hypertension: Secondary | ICD-10-CM | POA: Diagnosis not present

## 2017-06-17 DIAGNOSIS — L400 Psoriasis vulgaris: Secondary | ICD-10-CM | POA: Diagnosis not present

## 2017-06-17 DIAGNOSIS — F0390 Unspecified dementia without behavioral disturbance: Secondary | ICD-10-CM | POA: Diagnosis not present

## 2017-06-17 DIAGNOSIS — R413 Other amnesia: Secondary | ICD-10-CM | POA: Diagnosis not present

## 2017-06-17 DIAGNOSIS — R296 Repeated falls: Secondary | ICD-10-CM | POA: Diagnosis not present

## 2017-06-17 DIAGNOSIS — Z23 Encounter for immunization: Secondary | ICD-10-CM | POA: Diagnosis not present

## 2017-06-30 DIAGNOSIS — Z0000 Encounter for general adult medical examination without abnormal findings: Secondary | ICD-10-CM | POA: Diagnosis not present

## 2017-06-30 DIAGNOSIS — R296 Repeated falls: Secondary | ICD-10-CM | POA: Diagnosis not present

## 2017-06-30 DIAGNOSIS — F0390 Unspecified dementia without behavioral disturbance: Secondary | ICD-10-CM | POA: Diagnosis not present

## 2017-06-30 DIAGNOSIS — R413 Other amnesia: Secondary | ICD-10-CM | POA: Diagnosis not present

## 2017-06-30 DIAGNOSIS — I10 Essential (primary) hypertension: Secondary | ICD-10-CM | POA: Diagnosis not present

## 2017-06-30 DIAGNOSIS — L539 Erythematous condition, unspecified: Secondary | ICD-10-CM | POA: Diagnosis not present

## 2017-06-30 DIAGNOSIS — F330 Major depressive disorder, recurrent, mild: Secondary | ICD-10-CM | POA: Diagnosis not present

## 2017-06-30 DIAGNOSIS — B000 Eczema herpeticum: Secondary | ICD-10-CM | POA: Diagnosis not present

## 2017-06-30 DIAGNOSIS — Z9119 Patient's noncompliance with other medical treatment and regimen: Secondary | ICD-10-CM | POA: Diagnosis not present

## 2017-07-19 DIAGNOSIS — L400 Psoriasis vulgaris: Secondary | ICD-10-CM | POA: Diagnosis not present

## 2017-07-19 DIAGNOSIS — B000 Eczema herpeticum: Secondary | ICD-10-CM | POA: Diagnosis not present

## 2017-07-19 DIAGNOSIS — R413 Other amnesia: Secondary | ICD-10-CM | POA: Diagnosis not present

## 2017-07-19 DIAGNOSIS — R21 Rash and other nonspecific skin eruption: Secondary | ICD-10-CM | POA: Diagnosis not present

## 2017-07-19 DIAGNOSIS — L539 Erythematous condition, unspecified: Secondary | ICD-10-CM | POA: Diagnosis not present

## 2017-07-19 DIAGNOSIS — F0390 Unspecified dementia without behavioral disturbance: Secondary | ICD-10-CM | POA: Diagnosis not present

## 2017-07-19 DIAGNOSIS — Z9119 Patient's noncompliance with other medical treatment and regimen: Secondary | ICD-10-CM | POA: Diagnosis not present

## 2017-07-19 DIAGNOSIS — R296 Repeated falls: Secondary | ICD-10-CM | POA: Diagnosis not present

## 2017-07-19 DIAGNOSIS — I10 Essential (primary) hypertension: Secondary | ICD-10-CM | POA: Diagnosis not present

## 2021-02-06 ENCOUNTER — Emergency Department (HOSPITAL_COMMUNITY): Payer: Medicare Other

## 2021-02-06 ENCOUNTER — Inpatient Hospital Stay (HOSPITAL_COMMUNITY)
Admission: EM | Admit: 2021-02-06 | Discharge: 2021-02-11 | DRG: 689 | Disposition: A | Discharge status: Home / Self Care | Payer: Medicare Other | Source: Skilled Nursing Facility | Attending: Internal Medicine | Admitting: Internal Medicine

## 2021-02-06 ENCOUNTER — Other Ambulatory Visit: Payer: Self-pay

## 2021-02-06 DIAGNOSIS — G929 Unspecified toxic encephalopathy: Secondary | ICD-10-CM | POA: Diagnosis present

## 2021-02-06 DIAGNOSIS — R413 Other amnesia: Secondary | ICD-10-CM | POA: Diagnosis present

## 2021-02-06 DIAGNOSIS — R197 Diarrhea, unspecified: Secondary | ICD-10-CM | POA: Diagnosis not present

## 2021-02-06 DIAGNOSIS — G629 Polyneuropathy, unspecified: Secondary | ICD-10-CM | POA: Diagnosis present

## 2021-02-06 DIAGNOSIS — Z20822 Contact with and (suspected) exposure to covid-19: Secondary | ICD-10-CM | POA: Diagnosis present

## 2021-02-06 DIAGNOSIS — F0390 Unspecified dementia without behavioral disturbance: Secondary | ICD-10-CM | POA: Diagnosis present

## 2021-02-06 DIAGNOSIS — I10 Essential (primary) hypertension: Secondary | ICD-10-CM | POA: Diagnosis present

## 2021-02-06 DIAGNOSIS — E860 Dehydration: Secondary | ICD-10-CM | POA: Diagnosis present

## 2021-02-06 DIAGNOSIS — F32A Depression, unspecified: Secondary | ICD-10-CM | POA: Diagnosis present

## 2021-02-06 DIAGNOSIS — Z8249 Family history of ischemic heart disease and other diseases of the circulatory system: Secondary | ICD-10-CM | POA: Diagnosis not present

## 2021-02-06 DIAGNOSIS — K589 Irritable bowel syndrome without diarrhea: Secondary | ICD-10-CM | POA: Diagnosis not present

## 2021-02-06 DIAGNOSIS — N390 Urinary tract infection, site not specified: Secondary | ICD-10-CM | POA: Diagnosis present

## 2021-02-06 DIAGNOSIS — F419 Anxiety disorder, unspecified: Secondary | ICD-10-CM | POA: Diagnosis present

## 2021-02-06 DIAGNOSIS — Z87440 Personal history of urinary (tract) infections: Secondary | ICD-10-CM

## 2021-02-06 DIAGNOSIS — D72829 Elevated white blood cell count, unspecified: Secondary | ICD-10-CM | POA: Diagnosis not present

## 2021-02-06 DIAGNOSIS — N179 Acute kidney failure, unspecified: Secondary | ICD-10-CM | POA: Diagnosis present

## 2021-02-06 DIAGNOSIS — E876 Hypokalemia: Secondary | ICD-10-CM | POA: Diagnosis present

## 2021-02-06 DIAGNOSIS — N3000 Acute cystitis without hematuria: Secondary | ICD-10-CM | POA: Diagnosis not present

## 2021-02-06 DIAGNOSIS — H3530 Unspecified macular degeneration: Secondary | ICD-10-CM | POA: Diagnosis present

## 2021-02-06 DIAGNOSIS — M1990 Unspecified osteoarthritis, unspecified site: Secondary | ICD-10-CM | POA: Diagnosis present

## 2021-02-06 DIAGNOSIS — E785 Hyperlipidemia, unspecified: Secondary | ICD-10-CM | POA: Diagnosis present

## 2021-02-06 DIAGNOSIS — B9620 Unspecified Escherichia coli [E. coli] as the cause of diseases classified elsewhere: Secondary | ICD-10-CM | POA: Diagnosis present

## 2021-02-06 DIAGNOSIS — E7800 Pure hypercholesterolemia, unspecified: Secondary | ICD-10-CM | POA: Diagnosis present

## 2021-02-06 DIAGNOSIS — R0602 Shortness of breath: Secondary | ICD-10-CM

## 2021-02-06 DIAGNOSIS — K580 Irritable bowel syndrome with diarrhea: Secondary | ICD-10-CM | POA: Diagnosis present

## 2021-02-06 LAB — COMPREHENSIVE METABOLIC PANEL
ALT: 23 U/L (ref 0–44)
AST: 35 U/L (ref 15–41)
Albumin: 2.7 g/dL — ABNORMAL LOW (ref 3.5–5.0)
Alkaline Phosphatase: 85 U/L (ref 38–126)
Anion gap: 8 (ref 5–15)
BUN: 35 mg/dL — ABNORMAL HIGH (ref 8–23)
CO2: 24 mmol/L (ref 22–32)
Calcium: 8.2 mg/dL — ABNORMAL LOW (ref 8.9–10.3)
Chloride: 105 mmol/L (ref 98–111)
Creatinine, Ser: 1.7 mg/dL — ABNORMAL HIGH (ref 0.44–1.00)
GFR, Estimated: 29 mL/min — ABNORMAL LOW (ref >60)
Glucose, Bld: 109 mg/dL — ABNORMAL HIGH (ref 70–99)
Potassium: 3.8 mmol/L (ref 3.5–5.1)
Sodium: 137 mmol/L (ref 135–145)
Total Bilirubin: 1 mg/dL (ref 0.3–1.2)
Total Protein: 5.8 g/dL — ABNORMAL LOW (ref 6.5–8.1)

## 2021-02-06 LAB — CBC WITH DIFFERENTIAL/PLATELET
Abs Immature Granulocytes: 0.01 10*3/uL (ref 0.00–0.07)
Abs Immature Granulocytes: 0.06 10*3/uL (ref 0.00–0.07)
Basophils Absolute: 0 10*3/uL (ref 0.0–0.1)
Basophils Absolute: 0 10*3/uL (ref 0.0–0.1)
Basophils Relative: 0 %
Basophils Relative: 0 %
Eosinophils Absolute: 0 10*3/uL (ref 0.0–0.5)
Eosinophils Absolute: 0 10*3/uL (ref 0.0–0.5)
Eosinophils Relative: 1 %
Eosinophils Relative: 0 %
HCT: 38.8 % (ref 36.0–46.0)
HCT: 38.3 % (ref 36.0–46.0)
Hemoglobin: 12.1 g/dL (ref 12.0–15.0)
Hemoglobin: 12.5 g/dL (ref 12.0–15.0)
Immature Granulocytes: 1 %
Immature Granulocytes: 1 %
Lymphocytes Relative: 68 %
Lymphocytes Relative: 14 %
Lymphs Abs: 0.9 10*3/uL (ref 0.7–4.0)
Lymphs Abs: 1.7 10*3/uL (ref 0.7–4.0)
MCH: 32 pg (ref 26.0–34.0)
MCH: 31.7 pg (ref 26.0–34.0)
MCHC: 31.2 g/dL (ref 30.0–36.0)
MCHC: 32.6 g/dL (ref 30.0–36.0)
MCV: 102.6 fL — ABNORMAL HIGH (ref 80.0–100.0)
MCV: 97.2 fL (ref 80.0–100.0)
Monocytes Absolute: 0.1 10*3/uL (ref 0.1–1.0)
Monocytes Absolute: 1.7 10*3/uL — ABNORMAL HIGH (ref 0.1–1.0)
Monocytes Relative: 5 %
Monocytes Relative: 13 %
Neutro Abs: 0.3 10*3/uL — CL (ref 1.7–7.7)
Neutro Abs: 9 10*3/uL — ABNORMAL HIGH (ref 1.7–7.7)
Neutrophils Relative %: 25 %
Neutrophils Relative %: 72 %
Platelets: 7 10*3/uL — CL (ref 150–400)
Platelets: 144 10*3/uL — ABNORMAL LOW (ref 150–400)
RBC: 3.78 MIL/uL — ABNORMAL LOW (ref 3.87–5.11)
RBC: 3.94 MIL/uL (ref 3.87–5.11)
RDW: 13.4 % (ref 11.5–15.5)
RDW: 13.5 % (ref 11.5–15.5)
WBC: 1.3 10*3/uL — CL (ref 4.0–10.5)
WBC: 12.5 10*3/uL — ABNORMAL HIGH (ref 4.0–10.5)
nRBC: 0 % (ref 0.0–0.2)
nRBC: 0 % (ref 0.0–0.2)

## 2021-02-06 LAB — URINALYSIS, ROUTINE W REFLEX MICROSCOPIC
Bilirubin Urine: NEGATIVE
Glucose, UA: NEGATIVE mg/dL
Ketones, ur: NEGATIVE mg/dL
Nitrite: NEGATIVE
Protein, ur: 30 mg/dL — AB
Specific Gravity, Urine: 1.014 (ref 1.005–1.030)
WBC, UA: >50 WBC/hpf — ABNORMAL HIGH (ref 0–5)
pH: 5 (ref 5.0–8.0)

## 2021-02-06 LAB — RESP PANEL BY RT-PCR (FLU A&B, COVID) ARPGX2
Influenza A by PCR: NEGATIVE
Influenza B by PCR: NEGATIVE
SARS Coronavirus 2 by RT PCR: NEGATIVE

## 2021-02-06 LAB — LIPASE, BLOOD: Lipase: 33 U/L (ref 11–51)

## 2021-02-06 MED ORDER — SODIUM CHLORIDE 0.9 % IV SOLN: INTRAVENOUS | Status: DC

## 2021-02-06 MED ORDER — SODIUM CHLORIDE 0.9 % IV SOLN
1.0000 g | Freq: Once | INTRAVENOUS | Status: AC
  Administered 2021-02-06: 1 g via INTRAVENOUS
  Filled 2021-02-06: qty 10

## 2021-02-06 MED ORDER — SODIUM CHLORIDE 0.9 % IV BOLUS
500.0000 mL | Freq: Once | INTRAVENOUS | Status: AC
  Administered 2021-02-06: 500 mL via INTRAVENOUS

## 2021-02-06 NOTE — ED Provider Notes (Signed)
Care assumed from [place] at shift change, please see her note for full details, but in brief, Suzanne Monroe is a 87 y.o. female who presents with worsening of her chronic diarrhea now feeling very weak and fell in the shower yesterday due to weakness, unsure if patient hit head but did not lose consciousness.  Has underlying dementia but is also been more agitated than usual, concern for potential overlying UTI also has history of C. difficile.  Plan: Follow-up labs and CT, will likely need admission.  BP (!) 127/50   Pulse 66   Temp 98.9 F (37.2 C) (Oral)   Resp 19   Ht 5\' 3"  (1.6 m)   Wt 55 kg   SpO2 94%   BMI 21.48 kg/m   ED Course/Procedures   Labs Reviewed  COMPREHENSIVE METABOLIC PANEL - Abnormal; Notable for the following components:      Result Value   Glucose, Bld 109 (*)    BUN 35 (*)    Creatinine, Ser 1.70 (*)    Calcium 8.2 (*)    Total Protein 5.8 (*)    Albumin 2.7 (*)    GFR, Estimated 29 (*)    All other components within normal limits  URINALYSIS, ROUTINE W REFLEX MICROSCOPIC - Abnormal; Notable for the following components:   APPearance HAZY (*)    Hgb urine dipstick LARGE (*)    Protein, ur 30 (*)    Leukocytes,Ua LARGE (*)    WBC, UA >50 (*)    Bacteria, UA MANY (*)    Non Squamous Epithelial 0-5 (*)    All other components within normal limits  CBC WITH DIFFERENTIAL/PLATELET - Abnormal; Notable for the following components:   WBC 1.3 (*)    RBC 3.78 (*)    MCV 102.6 (*)    Platelets 7 (*)    Neutro Abs 0.3 (*)    All other components within normal limits  CBC WITH DIFFERENTIAL/PLATELET - Abnormal; Notable for the following components:   WBC 12.5 (*)    Platelets 144 (*)    Neutro Abs 9.0 (*)    Monocytes Absolute 1.7 (*)    All other components within normal limits  RESP PANEL BY RT-PCR (FLU A&B, COVID) [id number]  URINE CULTURE  C DIFFICILE QUICK SCREEN W PCR REFLEX    GASTROINTESTINAL PANEL BY PCR, STOOL (REPLACES STOOL CULTURE)   LIPASE, BLOOD  CBC WITH DIFFERENTIAL/PLATELET   CT ABDOMEN PELVIS WO CONTRAST  Result Date: 02/06/2021 CLINICAL DATA:  88 year old with fall in the shower yesterday due to weakness. Diarrhea. Diverticulitis suspected. EXAM: CT ABDOMEN AND PELVIS WITHOUT CONTRAST TECHNIQUE: Multidetector CT imaging of the abdomen and pelvis was performed following the standard protocol without IV contrast. COMPARISON:  None. FINDINGS: Lower chest: Small left pleural effusion with adjacent linear atelectasis in the left lower lobe. Trace right pleural effusion. Small pericardial effusion. Normal heart size. Hepatobiliary: No evidence of focal hepatic abnormality on this noncontrast exam. Cholecystectomy. Expected biliary prominence postcholecystectomy. No visualized choledocholithiasis. No evidence of hepatic injury on this noncontrast exam. Pancreas: No ductal dilatation or inflammation. Spleen: Normal in size. No focal abnormality or perisplenic hematoma. Adrenals/Urinary Tract: Adrenal thickening without dominant nodule or adrenal hemorrhage. Mild prominence of the left renal pelvis with perinephric edema. No visualized urolithiasis. Subcentimeter hyperdense focus in the lower left kidney is likely hyperdense cyst. 18 mm cyst in the upper left kidney. Minimal right perinephric edema without right hydronephrosis or renal calculi. No focal right renal abnormality. Partially  distended urinary bladder. No bladder wall thickening. Stomach/Bowel: Tiny hiatal hernia. The stomach is otherwise unremarkable. There is no small bowel obstruction or inflammation. Minimal distal small bowel diverticulosis. Multifocal colonic diverticulosis, prominent involving the sigmoid. No evidence of diverticulitis or acute colonic inflammation. There is liquid stool in the ascending and transverse colon. Appendix not definitively seen. Vascular/Lymphatic: Aortic atherosclerosis without aneurysm. No retroperitoneal fluid or edema. No abdominopelvic  adenopathy. Reproductive: Small uterine fibroids some of which are calcified. No adnexal mass. Other: No free air or ascites. There is a small fat containing umbilical hernia without inflammation. Musculoskeletal: Advanced degenerative disc disease at L3-L4 with additional degenerative disc disease at multiple levels. Bilateral hip osteoarthritis. There are no acute or suspicious osseous abnormalities. IMPRESSION: 1. Colonic diverticulosis without diverticulitis. Liquid stool in the ascending and transverse colon, can be seen with diarrheal illness. 2. Mild prominence of the left renal collecting system with left perinephric edema, recommend correlation with urinalysis to exclude urinary tract infection. No urolithiasis. 3. Small left and trace right pleural effusions. Small pericardial effusion. 4. Small uterine fibroids. Aortic Atherosclerosis (ICD10-I70.0). Electronically Signed   By: Keith Rake M.D.   On: 02/06/2021 17:17   CT Head Wo Contrast  Result Date: 02/06/2021 CLINICAL DATA:  Fall.  Head injury EXAM: CT HEAD WITHOUT CONTRAST TECHNIQUE: Contiguous axial images were obtained from the base of the skull through the vertex without intravenous contrast. COMPARISON:  CT head 06/27/2008 FINDINGS: Brain: Moderate atrophy with progression. Negative for acute infarct, hemorrhage, mass. Vascular: Negative for hyperdense vessel Skull: Negative for skull fracture Sinuses/Orbits: Extensive mucosal edema in the paranasal sinuses. Bony thickening and contraction of left maxillary sinus. Negative orbit Other: None IMPRESSION: Moderate atrophy.  No acute intracranial abnormality. Electronically Signed   By: Franchot Gallo M.D.   On: 02/06/2021 17:14     Procedures  MDM    Initial lab work significant for AKI, creatinine typically around 0.9, currently 1.7 with elevated BUN of 35, likely due to dehydration in the setting of worsening diarrhea, potassium normal fortunately  There was delay in initially  getting CBC, initial sample clotted, repeat seem to be an error it showed significant neutropenia and thrombocytopenia which does not seem consistent with patient's history or clinical process today.  Will repeat.  Repeat CBC with mild leukocytosis, stable hemoglobin, platelets of 144.  UA concerning for infection which may account for patient's worsening agitation, will treat with Rocephin.  Patient given additional 500 cc bolus as well.  Called and updated patient's daughter on plan of care.  Medicine team consulted for admission.  Case discussed with Dr. Juleen China with [facility] hospitalist who will see and admit the patient.       Jacqlyn Larsen, PA-C 02/06/21 2351    Lorelle Gibbs, DO 02/10/21 1508

## 2021-02-06 NOTE — ED Triage Notes (Addendum)
Pt brought to ED via EMS from [facility] with c/o diarrhea x 4 days. EMS states family is concerned for  dehydration. 500 mL NS given PTA. Pt alert and oriented x 4 on arrival to ED, denies pain.   EMS v/s: 108/62 BP 55 HR 92% room air, 97% 2L [place] 117 CBG NSR

## 2021-02-06 NOTE — ED Notes (Signed)
Patient transported to CT 

## 2021-02-06 NOTE — ED Provider Notes (Signed)
[facility] EMERGENCY DEPARTMENT Provider Note   CSN: [id number] Arrival date & time: 02/06/21  1235     History Chief Complaint  Patient presents with   Diarrhea    Suzanne Monroe is a 87 y.o. female presenting for evaluation of diarrhea.  Level 5 caveat due to dementia.  Patient states she has been having diarrhea.  She not able to tell me when her last bowel movement was or how frequently she is having diarrhea.  She denies pain.  She denies fevers, chills, nausea, vomiting, urinary symptoms.  Additional history obtained from patient's daughter, [name], who is its POA.  She states that patient has had worsening diarrhea over the past couple days, although does have baseline diarrhea.  She fell in the shower yesterday due to weakness, does not know if patient hit her head.  Patient did not lose consciousness.  She also reports patient has been much more agitated recently, which is how she has presented previously with infection such as UTI and C. difficile.  Daughter confirms no nausea or vomiting. Pt walks with a walker. She is in assisted living.     HPI     Past Medical History:  Diagnosis Date   Anxiety    Arthritis    OA   Cerumen impaction    recurrent Lt   Depression    Dry skin    severe dry skin/ichthyosis( Uses tanner understands risk)   Hypertension    Macular degeneration    Migraine    Hx of   Peripheral neuropathy     Patient Active Problem List   Diagnosis Date Noted   Essential hypertension 10/28/2014   Hyperlipidemia 10/28/2014   Central retinal vein occlusion 10/28/2014   Irregular heartbeat 10/28/2014   Fibromyalgia 05/15/2013   Low TSH level 05/07/2013   Memory loss 05/07/2013   Hypokalemia 05/07/2013   Frequent falls 05/07/2013   Anxiety and depression 10/30/2007   DYSPEPSIA 10/30/2007   HYPERCHOLESTEROLEMIA, PURE 03/30/2007   ICHTHYOSIS 03/29/2007   MIGRAINE, CHRONIC 11/03/2006   IRRITABLE BOWEL SYNDROME 11/03/2006    OSTEOARTHRITIS 11/03/2006   POLYMYALGIA RHEUMATICA 11/03/2006   PERIPHERAL NEUROPATHY 12/21/2000   COLITIS, ULCERATIVE NOS 08/23/1993    Past Surgical History:  Procedure Laterality Date   APPENDECTOMY     CHOLECYSTECTOMY     RHINOPLASTY     SHOULDER SURGERY     left shoulder replacement     OB History   No obstetric history on file.     Family History  Problem Relation Age of Onset   Stroke Mother    Hypertension Mother    Alcohol abuse Father    Heart disease Father    Kidney disease Father        dialysis   Hypertension Brother    Hypertension Daughter    Cancer Maternal Aunt        colon CA   Cancer Maternal Uncle        liver CA    Social History   Tobacco Use   Smoking status: Former    Pack years: 0.00    Types: Cigarettes    Quit date: 08/24/1983    Years since quitting: 37.4   Smokeless tobacco: Never  Substance Use Topics   Alcohol use: No   Drug use: No    Home Medications Prior to Admission medications   Medication Sig Start Date End Date Taking? Authorizing Provider  amLODipine (NORVASC) 5 MG tablet Take 1 tablet (5 mg  total) by mouth daily. 11/10/16   Copland, Gay Filler, MD  Calcium Carbonate-Vitamin D 600-400 MG-UNIT per tablet Take 1 tablet by mouth 2 (two) times daily. Reported on 12/10/2015    [provider]  donepezil (ARICEPT) 5 MG tablet TAKE 1 TABLET (5 MG TOTAL) BY MOUTH AT BEDTIME. 08/25/16   Copland, Gay Filler, MD  lisinopril (PRINIVIL,ZESTRIL) 40 MG tablet Take 1 tablet (40 mg total) by mouth daily. 08/12/16   Copland, Gay Filler, MD  PARoxetine (PAXIL) 20 MG tablet TAKE 1/2 TABLET BY MOUTH EVERY MORNING AND 1/2 TABLET EACH EVENING 08/12/16   Copland, Gay Filler, MD  propranolol (INDERAL) 60 MG tablet TAKE 2 TABLETS (120 MG TOTAL) BY MOUTH 2 (TWO) TIMES DAILY. 08/25/16   Copland, Gay Filler, MD    Allergies    Ampicillin, Cefaclor, Codeine, Duloxetine, Fexofenadine, Ibuprofen, Loratadine, and Nsaids  Review of Systems   Review  of Systems  Unable to perform ROS: Dementia  Gastrointestinal:  Positive for diarrhea.   Physical Exam Updated Vital Signs BP (!) 99/48   Pulse (!) 52   Temp 98.5 F (36.9 C) (Oral)   Resp 16   Ht 5\' 3"  (1.6 m)   Wt 55 kg   SpO2 94%   BMI 21.48 kg/m   Physical Exam Vitals and nursing note reviewed.  Constitutional:      General: She is not in acute distress.    Appearance: Normal appearance.     Comments: Not in acute distress  HENT:     Head: Normocephalic and atraumatic.     Comments: MM dry    Mouth/Throat:     Mouth: Mucous membranes are dry.  Eyes:     Conjunctiva/sclera: Conjunctivae normal.     Pupils: Pupils are equal, round, and reactive to light.  Cardiovascular:     Rate and Rhythm: Regular rhythm. Bradycardia present.     Pulses: Normal pulses.  Pulmonary:     Effort: Pulmonary effort is normal. No respiratory distress.     Breath sounds: Normal breath sounds. No wheezing.     Comments: Speaking in full sentences.  Clear lung sounds in all fields. Abdominal:     General: There is no distension.     Palpations: Abdomen is soft. There is no mass.     Tenderness: There is abdominal tenderness. There is no guarding or rebound.     Comments: Mild diffuse ttp of the abd  Musculoskeletal:        General: Normal range of motion.     Cervical back: Normal range of motion and neck supple.  Skin:    General: Skin is warm and dry.     Capillary Refill: Capillary refill takes less than 2 seconds.  Neurological:     Mental Status: She is alert and oriented to person, place, and time.  Psychiatric:        Mood and Affect: Mood and affect normal.        Speech: Speech normal.        Behavior: Behavior normal.    ED Results / Procedures / Treatments   Labs (all labs ordered are listed, but only abnormal results are displayed) Labs Reviewed - No data to display  EKG EKG Interpretation  Date/Time:  Friday February 06 2021 12:45:52 EDT Ventricular Rate:  53 PR  Interval:  177 QRS Duration: 110 QT Interval:  469 QTC Calculation: 441 R Axis:   -22 Text Interpretation: Sinus rhythm Borderline left axis deviation No previous tracing  Confirmed by Blanchie Dessert [phone]) on 02/06/2021 12:54:41 PM  Radiology No results found.  Procedures Procedures   Medications Ordered in ED Medications - No data to display  ED Course  I have reviewed the triage vital signs and the nursing notes.  Pertinent labs & imaging results that were available during my care of the patient were reviewed by me and considered in my medical decision making (see chart for details).    MDM Rules/Calculators/A&P                          Patient presenting for evaluation of diarrhea, generalized weakness, fall yesterday, and agitation.  On exam, patient appears nontoxic.  She does appear dehydrated, blood pressure soft and mucous membranes are dry.  She is bradycardic is have tachycardia, but this is likely due to being on a beta-blocker.  In the setting of frequent diarrhea and weakness, will obtain labs.  Patient has abdominal tenderness on my exam, CT ordered.  Due to the fall yesterday, will obtain CT head.  Urine ordered for further evaluation and per daughter request.  Pt signed out to Windell Moment, PA-C for f/u on labs and ct. If negative, plan for pt to ambulate and reassess.   Final Clinical Impression(s) / ED Diagnoses Final diagnoses:  None    Rx / DC Orders ED Discharge Orders     None        Franchot Heidelberg, PA-C 02/06/21 1459    Blanchie Dessert, MD 02/07/21 1059

## 2021-02-06 NOTE — H&P (Signed)
History and Physical        Hospital Admission Note Date: 02/06/2021  Patient name: Suzanne Monroe Medical record number: [id number] Date of birth: 1933-05-09 Age: 87 y.o. Gender: female  PCP: Copland, Gay Filler, MD    Patient coming from: ALF   I have reviewed all records in the [facility].    Chief Complaint:  Diarrhea   HPI: Suzanne Monroe is a 87 y.o. female with PMH of HLD, depression, memory loss, IBS with chronic diarrhea, HTN who presents for worsening diarrhea and fall related to generalized weakness. Unable to obtain history from patient due to dementia.   ED provider spoke to patient's daughter who provided the following history:  She states that patient has had worsening diarrhea over the past couple days, although does have baseline diarrhea.  She fell in the shower yesterday due to weakness, does not know if patient hit her head.  Patient did not lose consciousness.  She also reports patient has been much more agitated recently, which is how she has presented previously with infection such as UTI and C. difficile.  Daughter confirms no nausea or vomiting. Pt walks with a walker. She is in assisted living.   ED work-up/course:  Patient presenting for evaluation of diarrhea, generalized weakness, fall yesterday, and agitation.  On exam, patient appears nontoxic.  She does appear dehydrated, blood pressure soft and mucous membranes are dry.  She is bradycardic is have tachycardia, but this is likely due to being on a beta-blocker.  In the setting of frequent diarrhea and weakness, will obtain labs.  Patient has abdominal tenderness on my exam, CT ordered.  Due to the fall yesterday, will obtain CT head.  Urine ordered for further evaluation and per daughter request.   Pt signed out to Windell Moment, PA-C for f/u on labs and ct. If negative, plan for pt to ambulate and reassess.    Initial lab work significant for AKI,  creatinine typically around 0.9, currently 1.7 with elevated BUN of 35, likely due to dehydration in the setting of worsening diarrhea, potassium normal fortunately   There was delay in initially getting CBC, initial sample clotted, repeat seem to be an error it showed significant neutropenia and thrombocytopenia which does not seem consistent with patient's history or clinical process today.  Will repeat.   Repeat CBC with mild leukocytosis, stable hemoglobin, platelets of 144.   UA concerning for infection which may account for patient's worsening agitation, will treat with Rocephin.  Patient given additional 500 cc bolus as well.   Called and updated patient's daughter on plan of care.  Medicine team consulted for admission.  Case discussed with Dr. Juleen China with [facility] who will see and admit the patient.  Review of Systems: Positives marked in 'bold' Unable to perform due to dementia.   Past Medical History: Past Medical History:  Diagnosis Date   Anxiety    Arthritis    OA   Cerumen impaction    recurrent Lt   Depression    Dry skin    severe dry skin/ichthyosis( Uses tanner understands risk)   Hypertension    Macular degeneration  Migraine    Hx of   Peripheral neuropathy     Past Surgical History:  Procedure Laterality Date   APPENDECTOMY     CHOLECYSTECTOMY     RHINOPLASTY     SHOULDER SURGERY     left shoulder replacement    Medications: Prior to Admission medications   Medication Sig Start Date End Date Taking? Authorizing Provider  acetaminophen (TYLENOL) 500 MG tablet Take 500 mg by mouth every 6 (six) hours as needed for moderate pain.   Yes [provider]  acetaminophen (TYLENOL) 500 MG tablet Take 500 mg by mouth daily.   Yes [provider]  alum & mag hydroxide-simeth (MAALOX/MYLANTA) 200-200-20 MG/5ML suspension Take 30 mLs by mouth every 6 (six) hours as needed for indigestion (heartburn).   Yes [provider]   Calcium Carbonate-Vitamin D 600-400 MG-UNIT per tablet Take 1 tablet by mouth daily.   Yes [provider]  diphenoxylate-atropine (LOMOTIL) 2.5-0.025 MG tablet Take 1 tablet by mouth 4 (four) times daily as needed for diarrhea or loose stools.   Yes [provider]  donepezil (ARICEPT) 5 MG tablet TAKE 1 TABLET (5 MG TOTAL) BY MOUTH AT BEDTIME. 08/25/16  Yes Copland, Gay Filler, MD  furosemide (LASIX) 20 MG tablet Take 20 mg by mouth every other day.   Yes [provider]  guaifenesin (ROBITUSSIN) 100 MG/5ML syrup Take 200 mg by mouth every 6 (six) hours as needed for cough.   Yes [provider]  lisinopril (PRINIVIL,ZESTRIL) 40 MG tablet Take 1 tablet (40 mg total) by mouth daily. 08/12/16  Yes Copland, Gay Filler, MD  loperamide (IMODIUM A-D) 2 MG tablet Take 4 mg by mouth in the morning and at bedtime.   Yes [provider]  loperamide (IMODIUM) 2 MG capsule Take 2 mg by mouth as needed for diarrhea or loose stools. Do not exceed 8 doses in 24 hours   Yes [provider]  magnesium hydroxide (MILK OF MAGNESIA) 400 MG/5ML suspension Take 30 mLs by mouth at bedtime as needed for mild constipation.   Yes [provider]  melatonin 3 MG TABS tablet Take 3 mg by mouth at bedtime.   Yes [provider]  mirtazapine (REMERON) 15 MG tablet Take 15 mg by mouth at bedtime.   Yes [provider]  neomycin-bacitracin-polymyxin (NEOSPORIN) [phone] ointment Apply 1 application topically daily as needed (minor skin tears/ abrasions).   Yes [provider]  Nutritional Supplements (ENSURE ORIGINAL PO) Take 237 mLs by mouth 3 (three) times daily.   Yes [provider]  PARoxetine (PAXIL) 20 MG tablet TAKE 1/2 TABLET BY MOUTH EVERY MORNING AND 1/2 TABLET EACH EVENING Patient taking differently: Take 20 mg by mouth daily. 08/12/16  Yes Copland, Gay Filler, MD  Probiotic Product (PROBIOTIC BLEND PO) Take 1 capsule by  mouth daily.   Yes [provider]  propranolol (INDERAL) 60 MG tablet TAKE 2 TABLETS (120 MG TOTAL) BY MOUTH 2 (TWO) TIMES DAILY. Patient taking differently: Take 90 mg by mouth 2 (two) times daily. 08/25/16  Yes Copland, Gay Filler, MD  QUEtiapine (SEROQUEL) 25 MG tablet Take 25 mg by mouth at bedtime.   Yes [provider]  Skin Protectants, Misc. (MINERIN CREME EX) Apply 1 application topically as needed (dry skin).   Yes [provider]  amLODipine (NORVASC) 5 MG tablet Take 1 tablet (5 mg total) by mouth daily. Patient not taking: Reported on 02/06/2021 11/10/16   Copland, Gay Filler, MD  Allergies:   Allergies  Allergen Reactions   Ampicillin     REACTION: reaction not known   Cefaclor     REACTION: diarrhea   Codeine     REACTION: diarrhea   Duloxetine     REACTION: severe diarrhea   Fexofenadine     REACTION: fuzzy   Ibuprofen     REACTION: GI   Loratadine     REACTION: blurred vision   Nsaids     REACTION: fatigue    Social History:  reports that she quit smoking about 37 years ago. She has never used smokeless tobacco. She reports that she does not drink alcohol and does not use drugs.  Family History: Family History  Problem Relation Age of Onset   Stroke Mother    Hypertension Mother    Alcohol abuse Father    Heart disease Father    Kidney disease Father        dialysis   Hypertension Brother    Hypertension Daughter    Cancer Maternal Aunt        colon CA   Cancer Maternal Uncle        liver CA    Physical Exam: Blood pressure (!) 127/50, pulse 66, temperature 98.9 F (37.2 C), temperature source Oral, resp. rate 19, height 5\' 3"  (1.6 m), weight 55 kg, SpO2 94 %. General: Sleeping but awakens easily. No acute distress.  Eyes: pink conjunctiva,anicteric sclera, pupils equal and reactive to light and accomodation, HEENT: normocephalic, atraumatic, oropharynx clear Neck: supple, no masses or lymphadenopathy, no goiter, no  bruits, no JVD CVS: Regular rate and rhythm, without murmurs, rubs or gallops. No lower extremity edema Resp : Clear to auscultation bilaterally, no wheezing, rales or rhonchi. GI : Soft, nontender, nondistended, positive bowel sounds, no masses. No hepatomegaly. No hernia.  Musculoskeletal: No clubbing or cyanosis, positive pedal pulses. No contracture. ROM intact  Neuro: Grossly intact, no focal neurological deficits, strength 5/5 upper and lower extremities bilaterally Psych: Oriented to self only.  Skin: no rashes or lesions, warm and dry   LABS on Admission: I have personally reviewed all the labs and imagings below    Basic Metabolic Panel: Recent Labs  Lab 02/06/21 1330  NA 137  K 3.8  CL 105  CO2 24  GLUCOSE 109*  BUN 35*  CREATININE 1.70*  CALCIUM 8.2*   Liver Function Tests: Recent Labs  Lab 02/06/21 1330  AST 35  ALT 23  ALKPHOS 85  BILITOT 1.0  PROT 5.8*  ALBUMIN 2.7*   Recent Labs  Lab 02/06/21 1330  LIPASE 33   No results for input(s): AMMONIA in the last 168 hours. CBC: Recent Labs  Lab 02/06/21 2020 02/06/21 2134  WBC 1.3* 12.5*  NEUTROABS 0.3* 9.0*  HGB 12.1 12.5  HCT 38.8 38.3  MCV 102.6* 97.2  PLT 7* 144*   Cardiac Enzymes: No results for input(s): CKTOTAL, CKMB, CKMBINDEX, TROPONINI in the last 168 hours. BNP: Invalid input(s): POCBNP CBG: No results for input(s): GLUCAP in the last 168 hours.  Radiological Exams on Admission:  CT ABDOMEN PELVIS WO CONTRAST  Result Date: 02/06/2021 CLINICAL DATA:  87 year old with fall in the shower yesterday due to weakness. Diarrhea. Diverticulitis suspected. EXAM: CT ABDOMEN AND PELVIS WITHOUT CONTRAST TECHNIQUE: Multidetector CT imaging of the abdomen and pelvis was performed following the standard protocol without IV contrast. COMPARISON:  None. FINDINGS: Lower chest: Small left pleural effusion with adjacent linear atelectasis in the left lower lobe. Trace right pleural  effusion. Small  pericardial effusion. Normal heart size. Hepatobiliary: No evidence of focal hepatic abnormality on this noncontrast exam. Cholecystectomy. Expected biliary prominence postcholecystectomy. No visualized choledocholithiasis. No evidence of hepatic injury on this noncontrast exam. Pancreas: No ductal dilatation or inflammation. Spleen: Normal in size. No focal abnormality or perisplenic hematoma. Adrenals/Urinary Tract: Adrenal thickening without dominant nodule or adrenal hemorrhage. Mild prominence of the left renal pelvis with perinephric edema. No visualized urolithiasis. Subcentimeter hyperdense focus in the lower left kidney is likely hyperdense cyst. 18 mm cyst in the upper left kidney. Minimal right perinephric edema without right hydronephrosis or renal calculi. No focal right renal abnormality. Partially distended urinary bladder. No bladder wall thickening. Stomach/Bowel: Tiny hiatal hernia. The stomach is otherwise unremarkable. There is no small bowel obstruction or inflammation. Minimal distal small bowel diverticulosis. Multifocal colonic diverticulosis, prominent involving the sigmoid. No evidence of diverticulitis or acute colonic inflammation. There is liquid stool in the ascending and transverse colon. Appendix not definitively seen. Vascular/Lymphatic: Aortic atherosclerosis without aneurysm. No retroperitoneal fluid or edema. No abdominopelvic adenopathy. Reproductive: Small uterine fibroids some of which are calcified. No adnexal mass. Other: No free air or ascites. There is a small fat containing umbilical hernia without inflammation. Musculoskeletal: Advanced degenerative disc disease at L3-L4 with additional degenerative disc disease at multiple levels. Bilateral hip osteoarthritis. There are no acute or suspicious osseous abnormalities. IMPRESSION: 1. Colonic diverticulosis without diverticulitis. Liquid stool in the ascending and transverse colon, can be seen with diarrheal illness. 2. Mild  prominence of the left renal collecting system with left perinephric edema, recommend correlation with urinalysis to exclude urinary tract infection. No urolithiasis. 3. Small left and trace right pleural effusions. Small pericardial effusion. 4. Small uterine fibroids. Aortic Atherosclerosis (ICD10-I70.0). Electronically Signed   By: Keith Rake M.D.   On: 02/06/2021 17:17   CT Head Wo Contrast  Result Date: 02/06/2021 CLINICAL DATA:  Fall.  Head injury EXAM: CT HEAD WITHOUT CONTRAST TECHNIQUE: Contiguous axial images were obtained from the base of the skull through the vertex without intravenous contrast. COMPARISON:  CT head 06/27/2008 FINDINGS: Brain: Moderate atrophy with progression. Negative for acute infarct, hemorrhage, mass. Vascular: Negative for hyperdense vessel Skull: Negative for skull fracture Sinuses/Orbits: Extensive mucosal edema in the paranasal sinuses. Bony thickening and contraction of left maxillary sinus. Negative orbit Other: None IMPRESSION: Moderate atrophy.  No acute intracranial abnormality. Electronically Signed   By: Franchot Gallo M.D.   On: 02/06/2021 17:14      EKG: Independently reviewed. NSR.    Assessment/Plan Active Problems:   HYPERCHOLESTEROLEMIA, PURE   Anxiety and depression   Irritable bowel syndrome   Memory loss   Essential hypertension   UTI (urinary tract infection)   Leukocytosis   AKI (acute kidney injury) ([place])  UTI  UA with large leukocytes, many bacteria, >50 WBCs, 6-10 RBCs. CT abdomen: Mild prominence of the left renal collecting system with left perinephric edema, recommend correlation with urinalysis to exclude urinary tract infection. No urolithiasis.  Has leukocytosis 12.5. Afebrile. Hemodynamically stable. S/p two 500 cc fluid boluses. -admit to inpatient -CTX ordered in ED, continue and narrow to PO abx as appropriate  -urine culture pending  -NS at 100 cc/hr  Acute on Chronic Diarrhea  Patient with h/o IBS with  chronic diarrhea on multiple medications. Apparently has been worsening. CT abdomen shows diverticulosis without diverticulitis.  -stool studies and C diff ordered -hold antidiarrheals pending infectious work up  AKI Cr 1.70. Baseline appears to be around 0.9.  BUN/Cr >20 consistent with prerenal etiology. Likely related to dehydration from diarrhea.  -s/p 1000cc bolus, continue fluids at 100 cc/hr  -repeat BMET in AM  -avoid nephrotoxic agents -hold Lisinopril and Lasix   HTN  BP currently stable. HR was bradycardic at presentation and now in low 60s. Has beta blocker on home medication list.  -hold Lisinopril and Lasix due to AKI and dehyration -hold Propanolol due to bradycardia, may need different HTN agent   Dementia  -continue Aricept  -monitor for delirium related to infection   Depression/Anxiety  -continue home Remeron, Seroquel, Paxil    DVT prophylaxis: Lovenox   CODE STATUS: FULL   Consults called: None   Admission status: Inpatient   The medical decision making on this patient was of high complexity and the patient is at high risk for clinical deterioration, therefore this is a level 3 admission.  Severity of Illness:      The appropriate patient status for this patient is INPATIENT. Inpatient status is judged to be reasonable and necessary in order to provide the required intensity of service to ensure the patient's safety. The patient's presenting symptoms, physical exam findings, and initial radiographic and laboratory data in the context of their chronic comorbidities is felt to place them at high risk for further clinical deterioration. Furthermore, it is not anticipated that the patient will be medically stable for discharge from the hospital within 2 midnights of admission. The following factors support the patient status of inpatient.   " The patient's presenting symptoms include generalized weakness, diarrhea. " The worrisome physical exam findings include  bradycardia. " The initial radiographic and laboratory data are worrisome because of leukocytosis, CT abdomen with signs of UTI, UA concerning for infection. " The chronic co-morbidities include HLD, depression, memory loss, IBS with chronic diarrhea, HTN.   * I certify that at the point of admission it is my clinical judgment that the patient will require inpatient hospital care spanning beyond 2 midnights from the point of admission due to high intensity of service, high risk for further deterioration and high frequency of surveillance required.*   Time Spent on Admission: 40 minutes      Melina Schools D.O.  [facility] 02/06/2021, 11:17 PM

## 2021-02-06 NOTE — ED Notes (Signed)
Clance Boll POA daughter [phone] would like to speak to someone about the patient

## 2021-02-06 NOTE — ED Notes (Signed)
Pt caox4 and refused rectal temp.

## 2021-02-06 NOTE — ED Notes (Signed)
Suzanne Monroe POA daughter [phone] would like to speak to someone about the patient

## 2021-02-07 LAB — COMPREHENSIVE METABOLIC PANEL
ALT: 23 U/L (ref 0–44)
AST: 27 U/L (ref 15–41)
Albumin: 2.3 g/dL — ABNORMAL LOW (ref 3.5–5.0)
Alkaline Phosphatase: 96 U/L (ref 38–126)
Anion gap: 8 (ref 5–15)
BUN: 25 mg/dL — ABNORMAL HIGH (ref 8–23)
CO2: 21 mmol/L — ABNORMAL LOW (ref 22–32)
Calcium: 7.5 mg/dL — ABNORMAL LOW (ref 8.9–10.3)
Chloride: 108 mmol/L (ref 98–111)
Creatinine, Ser: 1.13 mg/dL — ABNORMAL HIGH (ref 0.44–1.00)
GFR, Estimated: 48 mL/min — ABNORMAL LOW (ref >60)
Glucose, Bld: 106 mg/dL — ABNORMAL HIGH (ref 70–99)
Potassium: 3.2 mmol/L — ABNORMAL LOW (ref 3.5–5.1)
Sodium: 137 mmol/L (ref 135–145)
Total Bilirubin: 0.9 mg/dL (ref 0.3–1.2)
Total Protein: 5.2 g/dL — ABNORMAL LOW (ref 6.5–8.1)

## 2021-02-07 LAB — CBC WITH DIFFERENTIAL/PLATELET
Abs Immature Granulocytes: 0.08 10*3/uL — ABNORMAL HIGH (ref 0.00–0.07)
Basophils Absolute: 0 10*3/uL (ref 0.0–0.1)
Basophils Relative: 0 %
Eosinophils Absolute: 0.1 10*3/uL (ref 0.0–0.5)
Eosinophils Relative: 1 %
HCT: 33.1 % — ABNORMAL LOW (ref 36.0–46.0)
Hemoglobin: 10.9 g/dL — ABNORMAL LOW (ref 12.0–15.0)
Immature Granulocytes: 1 %
Lymphocytes Relative: 14 %
Lymphs Abs: 1.7 10*3/uL (ref 0.7–4.0)
MCH: 31.3 pg (ref 26.0–34.0)
MCHC: 32.9 g/dL (ref 30.0–36.0)
MCV: 95.1 fL (ref 80.0–100.0)
Monocytes Absolute: 1.6 10*3/uL — ABNORMAL HIGH (ref 0.1–1.0)
Monocytes Relative: 13 %
Neutro Abs: 8.7 10*3/uL — ABNORMAL HIGH (ref 1.7–7.7)
Neutrophils Relative %: 71 %
Platelets: 142 10*3/uL — ABNORMAL LOW (ref 150–400)
RBC: 3.48 MIL/uL — ABNORMAL LOW (ref 3.87–5.11)
RDW: 13.6 % (ref 11.5–15.5)
WBC: 12.2 10*3/uL — ABNORMAL HIGH (ref 4.0–10.5)
nRBC: 0 % (ref 0.0–0.2)

## 2021-02-07 LAB — BRAIN NATRIURETIC PEPTIDE: B Natriuretic Peptide: 365.1 pg/mL — ABNORMAL HIGH (ref 0.0–100.0)

## 2021-02-07 LAB — MAGNESIUM: Magnesium: 2.1 mg/dL (ref 1.7–2.4)

## 2021-02-07 LAB — D-DIMER, QUANTITATIVE: D-Dimer, Quant: 3.12 ug/mL-FEU — ABNORMAL HIGH (ref 0.00–0.50)

## 2021-02-07 LAB — PROCALCITONIN: Procalcitonin: 0.79 ng/mL

## 2021-02-07 MED ORDER — QUETIAPINE FUMARATE 25 MG PO TABS
25.0000 mg | ORAL_TABLET | Freq: Every day | ORAL | Status: DC
  Administered 2021-02-07 – 2021-02-10 (×4): 25 mg via ORAL
  Filled 2021-02-07 (×5): qty 1

## 2021-02-07 MED ORDER — PAROXETINE HCL 20 MG PO TABS
20.0000 mg | ORAL_TABLET | Freq: Every day | ORAL | Status: DC
  Administered 2021-02-07 – 2021-02-11 (×5): 20 mg via ORAL
  Filled 2021-02-07 (×6): qty 1

## 2021-02-07 MED ORDER — DONEPEZIL HCL 5 MG PO TABS
5.0000 mg | ORAL_TABLET | Freq: Every day | ORAL | Status: DC
  Administered 2021-02-07 – 2021-02-10 (×4): 5 mg via ORAL
  Filled 2021-02-07 (×5): qty 1

## 2021-02-07 MED ORDER — ENOXAPARIN SODIUM 30 MG/0.3ML IJ SOSY
30.0000 mg | PREFILLED_SYRINGE | Freq: Every day | INTRAMUSCULAR | Status: DC
  Administered 2021-02-07 – 2021-02-10 (×4): 30 mg via SUBCUTANEOUS
  Filled 2021-02-07 (×5): qty 0.3

## 2021-02-07 MED ORDER — POTASSIUM CHLORIDE CRYS ER 20 MEQ PO TBCR
40.0000 meq | EXTENDED_RELEASE_TABLET | Freq: Once | ORAL | Status: AC
  Administered 2021-02-07: 40 meq via ORAL
  Filled 2021-02-07: qty 2

## 2021-02-07 MED ORDER — MIRTAZAPINE 15 MG PO TABS
15.0000 mg | ORAL_TABLET | Freq: Every day | ORAL | Status: DC
  Administered 2021-02-07 – 2021-02-10 (×4): 15 mg via ORAL
  Filled 2021-02-07 (×5): qty 1

## 2021-02-07 MED ORDER — SODIUM CHLORIDE 0.9 % IV BOLUS
500.0000 mL | Freq: Once | INTRAVENOUS | Status: AC
  Administered 2021-02-07: 500 mL via INTRAVENOUS

## 2021-02-07 MED ORDER — ENSURE ENLIVE PO LIQD
Freq: Three times a day (TID) | ORAL | Status: DC
  Administered 2021-02-09 – 2021-02-11 (×6): 237 mL via ORAL
  Filled 2021-02-07 (×6): qty 237

## 2021-02-07 MED ORDER — SODIUM CHLORIDE 0.9 % IV SOLN
1.0000 g | Freq: Once | INTRAVENOUS | Status: AC
  Administered 2021-02-07: 1 g via INTRAVENOUS
  Filled 2021-02-07: qty 10

## 2021-02-07 NOTE — Evaluation (Signed)
Physical Therapy Evaluation Patient Details Name: Suzanne Monroe MRN: [id number] DOB: 08-13-1933 Today's Date: 02/07/2021   History of Present Illness  Pt is a 87 y.o. F who presents with worsening diarrhea, fall related to generalized weakness. In the ED, she was diagnosed with UTI, dehydration, AKI. Significant PMH: dementia, IBS with chronic diarrhea, HTN.  Clinical Impression  PTA, pt lives at [facility] and uses a walker for ambulation per chart review. Pt A&Ox2 (self, time) and follows all one step commands. Pt likely fairly close to her functional baseline. Ambulating x 60 feet with a walker at a min guard assist level. Requiring min assist to don socks edge of bed; able to perform toileting/peri care without assist. In light of recent fall, decreased gait speed and safety awareness, pt is at high risk for future falls. Recommend follow up HHPT at ALF to maximize functional mobility.     Follow Up Recommendations Home health PT;Supervision/Assistance - 24 hour (at [address])    Equipment Recommendations  None recommended by PT    Recommendations for Other Services       Precautions / Restrictions Precautions Precautions: Fall Restrictions Weight Bearing Restrictions: No      Mobility  Bed Mobility Overal bed mobility: Needs Assistance Bed Mobility: Supine to Sit;Sit to Supine     Supine to sit: Min assist Sit to supine: Min assist   General bed mobility comments: MinA for supine <> sit with maneuvering on a stretcher    Transfers Overall transfer level: Needs assistance Equipment used: Rolling walker (2 wheeled) Transfers: Sit to/from Stand Sit to Stand: Min guard         General transfer comment: Min guard to rise from stretcher and toilet  Ambulation/Gait Ambulation/Gait assistance: Min guard Gait Distance (Feet): 60 Feet Assistive device: Rolling walker (2 wheeled) Gait Pattern/deviations: Step-through pattern;Decreased stride length Gait  velocity: decreased   General Gait Details: Cues for environmental negotiation, min guard for balance, no overt LOB  Stairs            Wheelchair Mobility    Modified Rankin (Stroke Patients Only)       Balance Overall balance assessment: Needs assistance Sitting-balance support: Feet supported Sitting balance-Leahy Scale: Fair     Standing balance support: No upper extremity supported;During functional activity Standing balance-Leahy Scale: Fair                               Pertinent Vitals/Pain Pain Assessment: No/denies pain    Home Living Family/patient expects to be discharged to:: Assisted living                 Additional Comments: [facility]    Prior Function Level of Independence: Needs assistance   Gait / Transfers Assistance Needed: Ambulatory with walker per chart review           Hand Dominance        Extremity/Trunk Assessment   Upper Extremity Assessment Upper Extremity Assessment: Overall WFL for tasks assessed    Lower Extremity Assessment Lower Extremity Assessment: Generalized weakness    Cervical / Trunk Assessment Cervical / Trunk Assessment: Kyphotic  Communication   Communication: No difficulties  Cognition Arousal/Alertness: Awake/alert Behavior During Therapy: WFL for tasks assessed/performed Overall Cognitive Status: History of cognitive impairments - at baseline  General Comments: History of dementia per chart review. Pt oriented to self and time; not oriented to place.      General Comments      Exercises     Assessment/Plan    PT Assessment Patient needs continued PT services  PT Problem List Decreased strength;Decreased activity tolerance;Decreased balance;Decreased mobility;Decreased cognition       PT Treatment Interventions DME instruction;Gait training;Functional mobility training;Therapeutic activities;Therapeutic exercise;Balance  training;Patient/family education    PT Goals (Current goals can be found in the Care Plan section)  Acute Rehab PT Goals Patient Stated Goal: did not state PT Goal Formulation: With patient Time For Goal Achievement: 02/21/21 Potential to Achieve Goals: Good    Frequency Min 3X/week   Barriers to discharge        Co-evaluation               AM-PAC PT "6 Clicks" Mobility  Outcome Measure Help needed turning from your back to your side while in a flat bed without using bedrails?: A Little Help needed moving from lying on your back to sitting on the side of a flat bed without using bedrails?: A Little Help needed moving to and from a bed to a chair (including a wheelchair)?: A Little Help needed standing up from a chair using your arms (e.g., wheelchair or bedside chair)?: A Little Help needed to walk in hospital room?: A Little Help needed climbing 3-5 steps with a railing? : A Little 6 Click Score: 18    End of Session Equipment Utilized During Treatment: Gait belt Activity Tolerance: Patient tolerated treatment well Patient left: in bed Nurse Communication: Mobility status PT Visit Diagnosis: Unsteadiness on feet (R26.81);History of falling (Z91.81)    Time: 4492-0100 PT Time Calculation (min) (ACUTE ONLY): 31 min   Charges:   PT Evaluation $PT Eval Moderate Complexity: 1 Mod PT Treatments $Therapeutic Activity: 8-22 mins        Wyona Almas, PT, DPT Acute Rehabilitation Services Pager [phone] Office [phone]   Deno Etienne 02/07/2021, 11:01 AM

## 2021-02-07 NOTE — ED Notes (Signed)
Drinking cranberry juice.

## 2021-02-07 NOTE — Progress Notes (Signed)
PROGRESS NOTE                                                                                                                                                                                                             Patient Demographics:    Suzanne Monroe, is a 87 y.o. female, DOB - [birth year], QIH:[id number]  Outpatient Primary MD for the patient is Copland, Gay Filler, MD    LOS - 1  Admit date - 02/06/2021    Chief Complaint  Patient presents with   Diarrhea       Brief Narrative (HPI from H&P) - Suzanne Monroe is a 87 y.o. female with PMH of HLD, depression, memory loss, IBS with chronic diarrhea, HTN who presents for worsening diarrhea and fall related to generalized weakness, in the ER she was dosed with UTI, dehydration and AKI.   Subjective:    [name] today has, No headache, No chest pain, No abdominal pain - No Nausea, No new weakness tingling or numbness, no SOB.   Assessment  & Plan :     UTI causing toxic encephalopathy in a patient with underlying dementia - History of recurrent UTIs, currently on empiric IV cephalosporin, monitor culture sensitivity.  CT head negative no focal deficits.   Acute on Chronic Diarrhea - R/O C. Diff as she has history of C. difficile in the past, if negative then Imodium as needed.   AKI - due to dehydration hold Lasix and ACE inhibitor, hydrate and monitor.    HTN - BP currently soft hold blood pressure medications   Dementia - continue Aricept, risk for delirium, family educated, avoid benzodiazepines and narcotics, use Haldol as needed if needed.   Depression/Anxiety  - continue home Remeron, Seroquel, Paxil   Hypokalemia.  Replaced.      Condition - Fair  Family Communication  :    Called daughter Suzanne Monroe [phone] on 02/07/2021 at 9:52 AM.  No response.  Called daughter Suzanne Monroe [phone]  Code Status :  Full  Consults  :  None  PUD Prophylaxis :     Procedures  :     CT head - Non acute  CT Abd - Pelvis -   1. Colonic diverticulosis without diverticulitis. Liquid stool in the ascending and transverse colon, can be seen with diarrheal illness. 2. Mild prominence of  the left renal collecting system with left perinephric edema, recommend correlation with urinalysis to exclude urinary tract infection. No urolithiasis. 3. Small left and trace right pleural effusions. Small pericardial effusion. 4. Small uterine fibroids. Aortic Atherosclerosis (ICD10-I70.0).      Disposition Plan  :    Status is: Inpatient  Remains inpatient appropriate because:IV treatments appropriate due to intensity of illness or inability to take PO  Dispo: The patient is from: Home              Anticipated d/c is to: Home              Patient currently is not medically stable to d/c.   Difficult to place patient No   DVT Prophylaxis  :    enoxaparin (LOVENOX) injection 30 mg Start: 02/07/21 1400  Lab Results  Component Value Date   PLT 142 (L) 02/07/2021    Diet :  Diet Order             Diet regular Room service appropriate? Yes; Fluid consistency: Thin  Diet effective now                    Inpatient Medications  Scheduled Meds:  donepezil  5 mg Oral QHS   enoxaparin (LOVENOX) injection  30 mg Subcutaneous Daily   feeding supplement   Oral TID BM   mirtazapine  15 mg Oral QHS   PARoxetine  20 mg Oral Daily   QUEtiapine  25 mg Oral QHS   Continuous Infusions:  sodium chloride 100 mL/hr at 02/07/21 0611   cefTRIAXone (ROCEPHIN)  IV     PRN Meds:.  Antibiotics  :    Anti-infectives (From admission, onward)    Start     Dose/Rate Route Frequency Ordered Stop   02/07/21 2330  cefTRIAXone (ROCEPHIN) 1 g in sodium chloride 0.9 % 100 mL IVPB        1 g 200 mL/hr over 30 Minutes Intravenous  Once 02/07/21 0603     02/06/21 2300  cefTRIAXone (ROCEPHIN) 1 g in sodium chloride 0.9 % 100 mL IVPB        1 g 200 mL/hr over 30 Minutes  Intravenous  Once 02/06/21 2251 02/06/21 2351        Time Spent in minutes  30   Lala Lund M.D on 02/07/2021 at 10:07 AM  To page go to [email or web address]   [facility] -  Office  [phone]  See all Orders from today for further details    Objective:   Vitals:   02/07/21 0700 02/07/21 0715 02/07/21 0730 02/07/21 0830  BP: 108/65 (!) 120/44 (!) 121/44 (!) 116/41  Pulse: (!) 57 (!) 57 (!) 58 (!) 57  Resp: (!) 23 (!) 23 (!) 22 (!) 25  Temp:      TempSrc:      SpO2: 95% 96% 94% 93%  Weight:      Height:        Wt Readings from Last 3 Encounters:  02/06/21 55 kg  03/10/17 55 kg  11/10/16 58.2 kg     Intake/Output Summary (Last 24 hours) at 02/07/2021 1007 Last data filed at 02/07/2021 0854 Gross per 24 hour  Intake 1600 ml  Output 50 ml  Net 1550 ml     Physical Exam  Awake but confused, No new F.N deficits, Normal affect [place].AT,PERRAL Supple Neck,No JVD, No cervical lymphadenopathy appriciated.  Symmetrical Chest wall movement, Good air movement bilaterally, CTAB RRR,No Gallops,Rubs or  new Murmurs, No Parasternal Heave +ve B.Sounds, Abd Soft, No tenderness, No organomegaly appriciated, No rebound - guarding or rigidity. No Cyanosis, Clubbing or edema, No new Rash or bruise       RN pressure injury documentation:     Data Review:    CBC Recent Labs  Lab 02/06/21 2020 02/06/21 2134 02/07/21 0730  WBC 1.3* 12.5* 12.2*  HGB 12.1 12.5 10.9*  HCT 38.8 38.3 33.1*  PLT 7* 144* 142*  MCV 102.6* 97.2 95.1  MCH 32.0 31.7 31.3  MCHC 31.2 32.6 32.9  RDW 13.4 13.5 13.6  LYMPHSABS 0.9 1.7 1.7  MONOABS 0.1 1.7* 1.6*  EOSABS 0.0 0.0 0.1  BASOSABS 0.0 0.0 0.0    Recent Labs  Lab 02/06/21 1330 02/07/21 0730 02/07/21 0835  NA 137  --  137  K 3.8  --  3.2*  CL 105  --  108  CO2 24  --  21*  GLUCOSE 109*  --  106*  BUN 35*  --  25*  CREATININE 1.70*  --  1.13*  CALCIUM 8.2*  --  7.5*  AST 35  --  27  ALT 23  --  23  ALKPHOS 85  --  96   BILITOT 1.0  --  0.9  ALBUMIN 2.7*  --  2.3*  MG  --   --  2.1  DDIMER  --   --  3.12*  BNP  --  365.1*  --     ------------------------------------------------------------------------------------------------------------------ No results for input(s): CHOL, HDL, LDLCALC, TRIG, CHOLHDL, LDLDIRECT in the last 72 hours.  No results found for: HGBA1C ------------------------------------------------------------------------------------------------------------------ No results for input(s): TSH, T4TOTAL, T3FREE, THYROIDAB in the last 72 hours.  Invalid input(s): FREET3  Cardiac Enzymes No results for input(s): CKMB, TROPONINI, MYOGLOBIN in the last 168 hours.  Invalid input(s): CK ------------------------------------------------------------------------------------------------------------------    Component Value Date/Time   BNP 365.1 (H) 02/07/2021 0730    Micro Results Recent Results (from the past 240 hour(s))  Resp Panel by RT-PCR (Flu A&B, Covid) Nasopharyngeal Swab     Status: None   Collection Time: 02/06/21  7:59 PM   Specimen: Nasopharyngeal Swab; Nasopharyngeal(NP) swabs in vial transport medium  Result Value Ref Range Status   SARS Coronavirus 2 by RT PCR NEGATIVE NEGATIVE Final    Comment: (NOTE) SARS-CoV-2 target nucleic acids are NOT DETECTED.  The SARS-CoV-2 RNA is generally detectable in upper respiratory specimens during the acute phase of infection. The lowest concentration of SARS-CoV-2 viral copies this assay can detect is 138 copies/mL. A negative result does not preclude SARS-Cov-2 infection and should not be used as the sole basis for treatment or other patient management decisions. A negative result may occur with  improper specimen collection/handling, submission of specimen other than nasopharyngeal swab, presence of viral mutation(s) within the areas targeted by this assay, and inadequate number of viral copies(<138 copies/mL). A negative result must  be combined with clinical observations, patient history, and epidemiological information. The expected result is Negative.  Fact Sheet for Patients:  EntrepreneurPulse.com.au  Fact Sheet for Healthcare Providers:  IncredibleEmployment.be  This test is no t yet approved or cleared by the [place] FDA and  has been authorized for detection and/or diagnosis of SARS-CoV-2 by FDA under an Emergency Use Authorization (EUA). This EUA will remain  in effect (meaning this test can be used) for the duration of the COVID-19 declaration under Section 564(b)(1) of the Act, 21 U.S.C.section 360bbb-3(b)(1), unless the authorization is terminated  or revoked  sooner.       Influenza A by PCR NEGATIVE NEGATIVE Final   Influenza B by PCR NEGATIVE NEGATIVE Final    Comment: (NOTE) The Xpert Xpress SARS-CoV-2/FLU/RSV plus assay is intended as an aid in the diagnosis of influenza from Nasopharyngeal swab specimens and should not be used as a sole basis for treatment. Nasal washings and aspirates are unacceptable for Xpert Xpress SARS-CoV-2/FLU/RSV testing.  Fact Sheet for Patients: EntrepreneurPulse.com.au  Fact Sheet for Healthcare Providers: IncredibleEmployment.be  This test is not yet approved or cleared by the [place] FDA and has been authorized for detection and/or diagnosis of SARS-CoV-2 by FDA under an Emergency Use Authorization (EUA). This EUA will remain in effect (meaning this test can be used) for the duration of the COVID-19 declaration under Section 564(b)(1) of the Act, 21 U.S.C. section 360bbb-3(b)(1), unless the authorization is terminated or revoked.  Performed at [facility], [place] [address] [place], [place] [address]     Radiology Reports CT ABDOMEN PELVIS WO CONTRAST  Result Date: 02/06/2021 CLINICAL DATA:  87 year old with fall in the shower yesterday due to weakness. Diarrhea.  Diverticulitis suspected. EXAM: CT ABDOMEN AND PELVIS WITHOUT CONTRAST TECHNIQUE: Multidetector CT imaging of the abdomen and pelvis was performed following the standard protocol without IV contrast. COMPARISON:  None. FINDINGS: Lower chest: Small left pleural effusion with adjacent linear atelectasis in the left lower lobe. Trace right pleural effusion. Small pericardial effusion. Normal heart size. Hepatobiliary: No evidence of focal hepatic abnormality on this noncontrast exam. Cholecystectomy. Expected biliary prominence postcholecystectomy. No visualized choledocholithiasis. No evidence of hepatic injury on this noncontrast exam. Pancreas: No ductal dilatation or inflammation. Spleen: Normal in size. No focal abnormality or perisplenic hematoma. Adrenals/Urinary Tract: Adrenal thickening without dominant nodule or adrenal hemorrhage. Mild prominence of the left renal pelvis with perinephric edema. No visualized urolithiasis. Subcentimeter hyperdense focus in the lower left kidney is likely hyperdense cyst. 18 mm cyst in the upper left kidney. Minimal right perinephric edema without right hydronephrosis or renal calculi. No focal right renal abnormality. Partially distended urinary bladder. No bladder wall thickening. Stomach/Bowel: Tiny hiatal hernia. The stomach is otherwise unremarkable. There is no small bowel obstruction or inflammation. Minimal distal small bowel diverticulosis. Multifocal colonic diverticulosis, prominent involving the sigmoid. No evidence of diverticulitis or acute colonic inflammation. There is liquid stool in the ascending and transverse colon. Appendix not definitively seen. Vascular/Lymphatic: Aortic atherosclerosis without aneurysm. No retroperitoneal fluid or edema. No abdominopelvic adenopathy. Reproductive: Small uterine fibroids some of which are calcified. No adnexal mass. Other: No free air or ascites. There is a small fat containing umbilical hernia without inflammation.  Musculoskeletal: Advanced degenerative disc disease at L3-L4 with additional degenerative disc disease at multiple levels. Bilateral hip osteoarthritis. There are no acute or suspicious osseous abnormalities. IMPRESSION: 1. Colonic diverticulosis without diverticulitis. Liquid stool in the ascending and transverse colon, can be seen with diarrheal illness. 2. Mild prominence of the left renal collecting system with left perinephric edema, recommend correlation with urinalysis to exclude urinary tract infection. No urolithiasis. 3. Small left and trace right pleural effusions. Small pericardial effusion. 4. Small uterine fibroids. Aortic Atherosclerosis (ICD10-I70.0). Electronically Signed   By: Keith Rake M.D.   On: 02/06/2021 17:17   CT Head Wo Contrast  Result Date: 02/06/2021 CLINICAL DATA:  Fall.  Head injury EXAM: CT HEAD WITHOUT CONTRAST TECHNIQUE: Contiguous axial images were obtained from the base of the skull through the vertex without intravenous contrast. COMPARISON:  CT head  06/27/2008 FINDINGS: Brain: Moderate atrophy with progression. Negative for acute infarct, hemorrhage, mass. Vascular: Negative for hyperdense vessel Skull: Negative for skull fracture Sinuses/Orbits: Extensive mucosal edema in the paranasal sinuses. Bony thickening and contraction of left maxillary sinus. Negative orbit Other: None IMPRESSION: Moderate atrophy.  No acute intracranial abnormality. Electronically Signed   By: Franchot Gallo M.D.   On: 02/06/2021 17:14

## 2021-02-08 LAB — CBC WITH DIFFERENTIAL/PLATELET
Abs Immature Granulocytes: 0.07 10*3/uL (ref 0.00–0.07)
Basophils Absolute: 0 10*3/uL (ref 0.0–0.1)
Basophils Relative: 0 %
Eosinophils Absolute: 0.1 10*3/uL (ref 0.0–0.5)
Eosinophils Relative: 1 %
HCT: 32.1 % — ABNORMAL LOW (ref 36.0–46.0)
Hemoglobin: 10.7 g/dL — ABNORMAL LOW (ref 12.0–15.0)
Immature Granulocytes: 1 %
Lymphocytes Relative: 20 %
Lymphs Abs: 2.1 10*3/uL (ref 0.7–4.0)
MCH: 31.8 pg (ref 26.0–34.0)
MCHC: 33.3 g/dL (ref 30.0–36.0)
MCV: 95.5 fL (ref 80.0–100.0)
Monocytes Absolute: 1.1 10*3/uL — ABNORMAL HIGH (ref 0.1–1.0)
Monocytes Relative: 11 %
Neutro Abs: 7 10*3/uL (ref 1.7–7.7)
Neutrophils Relative %: 67 %
Platelets: 155 10*3/uL (ref 150–400)
RBC: 3.36 MIL/uL — ABNORMAL LOW (ref 3.87–5.11)
RDW: 13.6 % (ref 11.5–15.5)
WBC: 10.5 10*3/uL (ref 4.0–10.5)
nRBC: 0 % (ref 0.0–0.2)

## 2021-02-08 LAB — COMPREHENSIVE METABOLIC PANEL
ALT: 31 U/L (ref 0–44)
AST: 43 U/L — ABNORMAL HIGH (ref 15–41)
Albumin: 2.1 g/dL — ABNORMAL LOW (ref 3.5–5.0)
Alkaline Phosphatase: 98 U/L (ref 38–126)
Anion gap: 7 (ref 5–15)
BUN: 19 mg/dL (ref 8–23)
CO2: 19 mmol/L — ABNORMAL LOW (ref 22–32)
Calcium: 7.5 mg/dL — ABNORMAL LOW (ref 8.9–10.3)
Chloride: 112 mmol/L — ABNORMAL HIGH (ref 98–111)
Creatinine, Ser: 0.91 mg/dL (ref 0.44–1.00)
GFR, Estimated: >60 mL/min (ref >60)
Glucose, Bld: 114 mg/dL — ABNORMAL HIGH (ref 70–99)
Potassium: 3.7 mmol/L (ref 3.5–5.1)
Sodium: 138 mmol/L (ref 135–145)
Total Bilirubin: 0.5 mg/dL (ref 0.3–1.2)
Total Protein: 4.8 g/dL — ABNORMAL LOW (ref 6.5–8.1)

## 2021-02-08 LAB — PROCALCITONIN: Procalcitonin: 0.44 ng/mL

## 2021-02-08 LAB — MAGNESIUM: Magnesium: 2 mg/dL (ref 1.7–2.4)

## 2021-02-08 LAB — BRAIN NATRIURETIC PEPTIDE: B Natriuretic Peptide: 404.5 pg/mL — ABNORMAL HIGH (ref 0.0–100.0)

## 2021-02-08 MED ORDER — ACETAMINOPHEN 325 MG PO TABS
650.0000 mg | ORAL_TABLET | Freq: Four times a day (QID) | ORAL | Status: AC | PRN
  Administered 2021-02-08 – 2021-02-09 (×2): 650 mg via ORAL
  Filled 2021-02-08 (×2): qty 2

## 2021-02-08 NOTE — Progress Notes (Signed)
PROGRESS NOTE                                                                                                                                                                                                             Patient Demographics:    Suzanne Monroe, is a 87 y.o. female, DOB - 1932/10/18, WOE:[id number]  Outpatient Primary MD for the patient is Copland, Gay Filler, MD    LOS - 2  Admit date - 02/06/2021    Chief Complaint  Patient presents with   Diarrhea       Brief Narrative (HPI from H&P) - Suzanne Monroe is a 87 y.o. female with PMH of HLD, depression, memory loss, IBS with chronic diarrhea, HTN who presents for worsening diarrhea and fall related to generalized weakness, in the ER she was dosed with UTI, dehydration and AKI.   Subjective:   Patient in bed, appears comfortable, denies any headache, no fever, no chest pain or pressure, no shortness of breath , no abdominal pain. No new focal weakness.    Assessment  & Plan :     UTI causing toxic encephalopathy in a patient with underlying dementia - History of recurrent UTIs, currently on empiric IV cephalosporin, monitor culture sensitivity.  CT head negative no focal deficits.   Acute on Chronic Diarrhea - R/O C. Diff as she has history of C. difficile in the past, if negative then Imodium as needed.   AKI - due to dehydration hold Lasix and ACE inhibitor, hydrate and monitor.    HTN - BP currently soft hold blood pressure medications   Dementia - continue Aricept, risk for delirium, family educated, avoid benzodiazepines and narcotics, use Haldol as needed if needed.   Depression/Anxiety  - continue home Remeron, Seroquel, Paxil   Hypokalemia.  Replaced.      Condition - Fair  Family Communication  :    Called daughter [name] [phone] on 02/07/2021 at 9:52 AM.  No response, called on 02/08/2021 twice at 9:53 and 9:56 AM busy signal.  Called  daughter Judson Roch [phone] updated on 02/07/2021 and 02/08/2021  Code Status :  Full  Consults  :  None  PUD Prophylaxis :    Procedures  :     CT head - Non acute  CT Abd - Pelvis -   1. Colonic diverticulosis  without diverticulitis. Liquid stool in the ascending and transverse colon, can be seen with diarrheal illness. 2. Mild prominence of the left renal collecting system with left perinephric edema, recommend correlation with urinalysis to exclude urinary tract infection. No urolithiasis. 3. Small left and trace right pleural effusions. Small pericardial effusion. 4. Small uterine fibroids. Aortic Atherosclerosis (ICD10-I70.0).      Disposition Plan  :    Status is: Inpatient  Remains inpatient appropriate because:IV treatments appropriate due to intensity of illness or inability to take PO  Dispo: The patient is from: Home              Anticipated d/c is to: Home              Patient currently is not medically stable to d/c.   Difficult to place patient No   DVT Prophylaxis  :    enoxaparin (LOVENOX) injection 30 mg Start: 02/07/21 1400  Lab Results  Component Value Date   PLT 155 02/08/2021    Diet :  Diet Order             Diet regular Room service appropriate? Yes; Fluid consistency: Thin  Diet effective now                    Inpatient Medications  Scheduled Meds:  donepezil  5 mg Oral QHS   enoxaparin (LOVENOX) injection  30 mg Subcutaneous Daily   feeding supplement   Oral TID BM   mirtazapine  15 mg Oral QHS   PARoxetine  20 mg Oral Daily   QUEtiapine  25 mg Oral QHS   Continuous Infusions:  sodium chloride 100 mL/hr at 02/08/21 0821   PRN Meds:.  Antibiotics  :    Anti-infectives (From admission, onward)    Start     Dose/Rate Route Frequency Ordered Stop   02/07/21 2330  cefTRIAXone (ROCEPHIN) 1 g in sodium chloride 0.9 % 100 mL IVPB        1 g 200 mL/hr over 30 Minutes Intravenous  Once 02/07/21 0603 02/07/21 2350   02/06/21 2300   cefTRIAXone (ROCEPHIN) 1 g in sodium chloride 0.9 % 100 mL IVPB        1 g 200 mL/hr over 30 Minutes Intravenous  Once 02/06/21 2251 02/06/21 2351        Time Spent in minutes  30   Lala Lund M.D on 02/08/2021 at 9:55 AM  To page go to [email or web address]   [facility] -  Office  [phone]  See all Orders from today for further details    Objective:   Vitals:   02/07/21 0830 02/07/21 1405 02/07/21 1523 02/07/21 2119  BP: (!) 116/41 (!) 158/47 (!) 137/47 (!) 128/42  Pulse: (!) 57 60 (!) 57 64  Resp: (!) 25 12 16 16   Temp:   99.1 F (37.3 C) (!) 100.4 F (38 C)  TempSrc:   Oral Oral  SpO2: 93% 100% 97% (!) 89%  Weight:      Height:        Wt Readings from Last 3 Encounters:  02/06/21 55 kg  03/10/17 55 kg  11/10/16 58.2 kg    No intake or output data in the 24 hours ending 02/08/21 0955    Physical Exam  Awake but mildly confused moving all 4 extremities, oriented to place and person  Suzanne Monroe.AT,PERRAL Supple Neck,No JVD, No cervical lymphadenopathy appriciated.  Symmetrical Chest wall movement, Good air movement bilaterally, CTAB RRR,No Gallops, Rubs  or new Murmurs, No Parasternal Heave +ve B.Sounds, Abd Soft, No tenderness, No organomegaly appriciated, No rebound - guarding or rigidity. No Cyanosis, Clubbing or edema, No new Rash or bruise   RN pressure injury documentation:     Data Review:    CBC Recent Labs  Lab 02/06/21 2020 02/06/21 2134 02/07/21 0730 02/08/21 0138  WBC 1.3* 12.5* 12.2* 10.5  HGB 12.1 12.5 10.9* 10.7*  HCT 38.8 38.3 33.1* 32.1*  PLT 7* 144* 142* 155  MCV 102.6* 97.2 95.1 95.5  MCH 32.0 31.7 31.3 31.8  MCHC 31.2 32.6 32.9 33.3  RDW 13.4 13.5 13.6 13.6  LYMPHSABS 0.9 1.7 1.7 2.1  MONOABS 0.1 1.7* 1.6* 1.1*  EOSABS 0.0 0.0 0.1 0.1  BASOSABS 0.0 0.0 0.0 0.0    Recent Labs  Lab 02/06/21 1330 02/07/21 0730 02/07/21 0835 02/08/21 0138  NA 137  --  137 138  K 3.8  --  3.2* 3.7  CL 105  --  108 112*  CO2 24   --  21* 19*  GLUCOSE 109*  --  106* 114*  BUN 35*  --  25* 19  CREATININE 1.70*  --  1.13* 0.91  CALCIUM 8.2*  --  7.5* 7.5*  AST 35  --  27 43*  ALT 23  --  23 31  ALKPHOS 85  --  96 98  BILITOT 1.0  --  0.9 0.5  ALBUMIN 2.7*  --  2.3* 2.1*  MG  --   --  2.1 2.0  DDIMER  --   --  3.12*  --   PROCALCITON  --   --  0.79 0.44  BNP  --  365.1*  --  404.5*    ------------------------------------------------------------------------------------------------------------------ No results for input(s): CHOL, HDL, LDLCALC, TRIG, CHOLHDL, LDLDIRECT in the last 72 hours.  No results found for: HGBA1C ------------------------------------------------------------------------------------------------------------------ No results for input(s): TSH, T4TOTAL, T3FREE, THYROIDAB in the last 72 hours.  Invalid input(s): FREET3  Cardiac Enzymes No results for input(s): CKMB, TROPONINI, MYOGLOBIN in the last 168 hours.  Invalid input(s): CK ------------------------------------------------------------------------------------------------------------------    Component Value Date/Time   BNP 404.5 (H) 02/08/2021 0138    Micro Results Recent Results (from the past 240 hour(s))  Urine culture     Status: Abnormal (Preliminary result)   Collection Time: 02/06/21  1:31 PM   Specimen: Urine, Random  Result Value Ref Range Status   Specimen Description URINE, RANDOM  Final   Special Requests NONE  Final   Culture (A)  Final    >=100,000 COLONIES/mL GRAM NEGATIVE RODS SUSCEPTIBILITIES TO FOLLOW Performed at [facility], [address], [place], [place] [address]    Report Status PENDING  Incomplete  Resp Panel by RT-PCR (Flu A&B, Covid) Nasopharyngeal Swab     Status: None   Collection Time: 02/06/21  7:59 PM   Specimen: Nasopharyngeal Swab; Nasopharyngeal(NP) swabs in vial transport medium  Result Value Ref Range Status   SARS Coronavirus 2 by RT PCR NEGATIVE NEGATIVE Final    Comment:  (NOTE) SARS-CoV-2 target nucleic acids are NOT DETECTED.  The SARS-CoV-2 RNA is generally detectable in upper respiratory specimens during the acute phase of infection. The lowest concentration of SARS-CoV-2 viral copies this assay can detect is 138 copies/mL. A negative result does not preclude SARS-Cov-2 infection and should not be used as the sole basis for treatment or other patient management decisions. A negative result may occur with  improper specimen collection/handling, submission of specimen other than nasopharyngeal  swab, presence of viral mutation(s) within the areas targeted by this assay, and inadequate number of viral copies(<138 copies/mL). A negative result must be combined with clinical observations, patient history, and epidemiological information. The expected result is Negative.  Fact Sheet for Patients:  EntrepreneurPulse.com.au  Fact Sheet for Healthcare Providers:  IncredibleEmployment.be  This test is no t yet approved or cleared by the [place] FDA and  has been authorized for detection and/or diagnosis of SARS-CoV-2 by FDA under an Emergency Use Authorization (EUA). This EUA will remain  in effect (meaning this test can be used) for the duration of the COVID-19 declaration under Section 564(b)(1) of the Act, 21 U.S.C.section 360bbb-3(b)(1), unless the authorization is terminated  or revoked sooner.       Influenza A by PCR NEGATIVE NEGATIVE Final   Influenza B by PCR NEGATIVE NEGATIVE Final    Comment: (NOTE) The Xpert Xpress SARS-CoV-2/FLU/RSV plus assay is intended as an aid in the diagnosis of influenza from Nasopharyngeal swab specimens and should not be used as a sole basis for treatment. Nasal washings and aspirates are unacceptable for Xpert Xpress SARS-CoV-2/FLU/RSV testing.  Fact Sheet for Patients: EntrepreneurPulse.com.au  Fact Sheet for Healthcare  Providers: IncredibleEmployment.be  This test is not yet approved or cleared by the [place] FDA and has been authorized for detection and/or diagnosis of SARS-CoV-2 by FDA under an Emergency Use Authorization (EUA). This EUA will remain in effect (meaning this test can be used) for the duration of the COVID-19 declaration under Section 564(b)(1) of the Act, 21 U.S.C. section 360bbb-3(b)(1), unless the authorization is terminated or revoked.  Performed at [facility], [place] [address] [place], [place] [address]     Radiology Reports CT ABDOMEN PELVIS WO CONTRAST  Result Date: 02/06/2021 CLINICAL DATA:  87 year old with fall in the shower yesterday due to weakness. Diarrhea. Diverticulitis suspected. EXAM: CT ABDOMEN AND PELVIS WITHOUT CONTRAST TECHNIQUE: Multidetector CT imaging of the abdomen and pelvis was performed following the standard protocol without IV contrast. COMPARISON:  None. FINDINGS: Lower chest: Small left pleural effusion with adjacent linear atelectasis in the left lower lobe. Trace right pleural effusion. Small pericardial effusion. Normal heart size. Hepatobiliary: No evidence of focal hepatic abnormality on this noncontrast exam. Cholecystectomy. Expected biliary prominence postcholecystectomy. No visualized choledocholithiasis. No evidence of hepatic injury on this noncontrast exam. Pancreas: No ductal dilatation or inflammation. Spleen: Normal in size. No focal abnormality or perisplenic hematoma. Adrenals/Urinary Tract: Adrenal thickening without dominant nodule or adrenal hemorrhage. Mild prominence of the left renal pelvis with perinephric edema. No visualized urolithiasis. Subcentimeter hyperdense focus in the lower left kidney is likely hyperdense cyst. 18 mm cyst in the upper left kidney. Minimal right perinephric edema without right hydronephrosis or renal calculi. No focal right renal abnormality. Partially distended urinary bladder. No  bladder wall thickening. Stomach/Bowel: Tiny hiatal hernia. The stomach is otherwise unremarkable. There is no small bowel obstruction or inflammation. Minimal distal small bowel diverticulosis. Multifocal colonic diverticulosis, prominent involving the sigmoid. No evidence of diverticulitis or acute colonic inflammation. There is liquid stool in the ascending and transverse colon. Appendix not definitively seen. Vascular/Lymphatic: Aortic atherosclerosis without aneurysm. No retroperitoneal fluid or edema. No abdominopelvic adenopathy. Reproductive: Small uterine fibroids some of which are calcified. No adnexal mass. Other: No free air or ascites. There is a small fat containing umbilical hernia without inflammation. Musculoskeletal: Advanced degenerative disc disease at L3-L4 with additional degenerative disc disease at multiple levels. Bilateral hip osteoarthritis. There are no acute  or suspicious osseous abnormalities. IMPRESSION: 1. Colonic diverticulosis without diverticulitis. Liquid stool in the ascending and transverse colon, can be seen with diarrheal illness. 2. Mild prominence of the left renal collecting system with left perinephric edema, recommend correlation with urinalysis to exclude urinary tract infection. No urolithiasis. 3. Small left and trace right pleural effusions. Small pericardial effusion. 4. Small uterine fibroids. Aortic Atherosclerosis (ICD10-I70.0). Electronically Signed   By: Keith Rake M.D.   On: 02/06/2021 17:17   CT Head Wo Contrast  Result Date: 02/06/2021 CLINICAL DATA:  Fall.  Head injury EXAM: CT HEAD WITHOUT CONTRAST TECHNIQUE: Contiguous axial images were obtained from the base of the skull through the vertex without intravenous contrast. COMPARISON:  CT head 06/27/2008 FINDINGS: Brain: Moderate atrophy with progression. Negative for acute infarct, hemorrhage, mass. Vascular: Negative for hyperdense vessel Skull: Negative for skull fracture Sinuses/Orbits:  Extensive mucosal edema in the paranasal sinuses. Bony thickening and contraction of left maxillary sinus. Negative orbit Other: None IMPRESSION: Moderate atrophy.  No acute intracranial abnormality. Electronically Signed   By: Franchot Gallo M.D.   On: 02/06/2021 17:14

## 2021-02-08 NOTE — Plan of Care (Signed)
  Problem: Activity: Goal: Risk for activity intolerance will decrease Outcome: Progressing   Problem: Elimination: Goal: Will not experience complications related to bowel motility Outcome: Progressing Goal: Will not experience complications related to urinary retention Outcome: Progressing   Problem: Safety: Goal: Ability to remain free from injury will improve Outcome: Progressing   

## 2021-02-08 NOTE — Plan of Care (Signed)
  Problem: Activity: Goal: Risk for activity intolerance will decrease Outcome: Progressing   Problem: Coping: Goal: Level of anxiety will decrease Outcome: Progressing   Problem: Pain Managment: Goal: General experience of comfort will improve Outcome: Progressing   Problem: Safety: Goal: Ability to remain free from injury will improve Outcome: Progressing   Problem: Skin Integrity: Goal: Risk for impaired skin integrity will decrease Outcome: Progressing   

## 2021-02-08 NOTE — Evaluation (Signed)
Occupational Therapy Evaluation Patient Details Name: Suzanne Monroe MRN: [id number] DOB: 12/24/1932 Today's Date: 02/08/2021    History of Present Illness Pt is a 87 y.o. F who presents with worsening diarrhea, fall related to generalized weakness. In the ED, she was diagnosed with UTI, dehydration, AKI. Significant PMH: dementia, IBS with chronic diarrhea, HTN.   Clinical Impression   Pt admitted to ED for concerns listed above. PTA pt reported that she was independent with her ADL's, living in an ALF, who provided her with 3 meals a day and assist if needed with ADL's. At this time, pt demonstrated increased impulsivity and decreased safety awareness. Pt requires min guard to min A with all ADL's and mobility for safety, as pt does not want to use a RW and attempts to walk without it, despite it being right in front of her. Pt will benefit from OT services at her ALF once discharged and Acute OT will follow to assist with addressing concerns listed below.     Follow Up Recommendations  Home health OT    Equipment Recommendations  None recommended by OT    Recommendations for Other Services       Precautions / Restrictions Precautions Precautions: Fall Restrictions Weight Bearing Restrictions: No      Mobility Bed Mobility Overal bed mobility: Needs Assistance Bed Mobility: Supine to Sit;Sit to Supine     Supine to sit: Min guard Sit to supine: Min guard        Transfers Overall transfer level: Needs assistance Equipment used: Rolling walker (2 wheeled) Transfers: Sit to/from [facility] Sit to Stand: Min guard Stand pivot transfers: Min guard;Min assist       General transfer comment: Min guard to rise from bed and BSC, however min A for transfers to other places, due to pt's impulsivity and want to not use RW    Balance Overall balance assessment: Needs assistance Sitting-balance support: Feet supported Sitting balance-Leahy Scale: Fair      Standing balance support: No upper extremity supported;During functional activity Standing balance-Leahy Scale: Poor                             ADL either performed or assessed with clinical judgement   ADL Overall ADL's : Needs assistance/impaired Eating/Feeding: Independent;Sitting   Grooming: Wash/dry hands;Wash/dry face;Set up;Sitting   Upper Body Bathing: Set up;Sitting   Lower Body Bathing: Min guard;Minimal assistance;Sitting/lateral leans;Sit to/from stand   Upper Body Dressing : Independent;Sitting   Lower Body Dressing: Min guard;Sitting/lateral leans;Sit to/from stand   Toilet Transfer: Minimal assistance;Stand-pivot   Toileting- Water quality scientist and Hygiene: Min guard;Minimal assistance;Sitting/lateral lean;Sit to/from stand   Tub/ Banker: Minimal assistance;Cueing for safety;Stand-pivot   Functional mobility during ADLs: Minimal assistance;Rolling walker General ADL Comments: Pt reporting that she does not need thewalker, trying to transfer without touching it, while it remains in front of her. Pt's balance is poor in standing without support. With RW, pt able to complete ADL's andmobility with min guard to min A     Vision Baseline Vision/History: Wears glasses Wears Glasses: At all times Patient Visual Report: No change from baseline Vision Assessment?: No apparent visual deficits     Perception Perception Perception Tested?: No   Praxis Praxis Praxis tested?: Not tested    Pertinent Vitals/Pain Pain Assessment: No/denies pain     Hand Dominance Right   Extremity/Trunk Assessment Upper Extremity Assessment Upper Extremity Assessment: Overall WFL for tasks  assessed   Lower Extremity Assessment Lower Extremity Assessment: Defer to PT evaluation   Cervical / Trunk Assessment Cervical / Trunk Assessment: Kyphotic   Communication Communication Communication: No difficulties   Cognition Arousal/Alertness:  Awake/alert Behavior During Therapy: WFL for tasks assessed/performed Overall Cognitive Status: History of cognitive impairments - at baseline                                 General Comments: History of dementia per chart review. Pt oriented to self and time; not oriented to place. Very impulsive with difficultyproblem solving and attending to higher level tasks.   General Comments  VSS on Ra    Exercises     Shoulder Instructions      Home Living Family/patient expects to be discharged to:: Assisted living                             Home Equipment: Walker - 2 wheels;Walker - 4 wheels;Toilet riser;Shower seat;Hand held shower head   Additional Comments: [place]      Prior Functioning/Environment Level of Independence: Needs assistance  Gait / Transfers Assistance Needed: Ambulatory with walker per chart review ADL's / Homemaking Assistance Needed: No assist for ADL's, per Pt            OT Problem List: Decreased strength;Decreased activity tolerance;Impaired balance (sitting and/or standing);Decreased cognition;Decreased safety awareness;Decreased knowledge of use of DME or AE      OT Treatment/Interventions: Self-care/ADL training;Therapeutic exercise;Energy conservation;DME and/or AE instruction;Therapeutic activities;Cognitive remediation/compensation;Patient/family education;Balance training    OT Goals(Current goals can be found in the care plan section) Acute Rehab OT Goals Patient Stated Goal: did not state OT Goal Formulation: With patient Time For Goal Achievement: 02/22/21 Potential to Achieve Goals: Good ADL Goals Pt Will Perform Grooming: with supervision;standing Pt Will Transfer to Toilet: with supervision;stand pivot transfer Pt Will Perform Toileting - Clothing Manipulation and hygiene: with supervision;with adaptive equipment  OT Frequency: Min 2X/week   Barriers to D/C:            Co-evaluation               AM-PAC OT "6 Clicks" Daily Activity     Outcome Measure Help from another person eating meals?: None Help from another person taking care of personal grooming?: A Little Help from another person toileting, which includes using toliet, bedpan, or urinal?: A Little Help from another person bathing (including washing, rinsing, drying)?: A Little Help from another person to put on and taking off regular upper body clothing?: A Little Help from another person to put on and taking off regular lower body clothing?: A Little 6 Click Score: 19   End of Session Equipment Utilized During Treatment: Rolling walker;Gait belt Nurse Communication: Mobility status  Activity Tolerance: Patient tolerated treatment well Patient left: in chair;with call bell/phone within reach;with chair alarm set  OT Visit Diagnosis: Unsteadiness on feet (R26.81);Other abnormalities of gait and mobility (R26.89);Muscle weakness (generalized) (M62.81)                Time: 1018-1050 OT Time Calculation (min): 32 min Charges:  OT General Charges $OT Visit: 1 Visit OT Evaluation $OT Eval Moderate Complexity: 1 Mod OT Treatments $Self Care/Home Management : 8-22 mins  Deloros Beretta H., OTR/L Acute Rehabilitation  Marti Mclane Elane Rhianon Zabawa 02/08/2021, 3:42 PM

## 2021-02-08 NOTE — Progress Notes (Signed)
Alarm went off, Nurse in room noted pt tried to get out of bed and IV was out. Assisted pt to chair, chair alarm on. Attempted inserting IV x1 unsuccessful.

## 2021-02-08 NOTE — Progress Notes (Signed)
Notified Suzanne Monroe that pt has a temp of 101.4. 178/60, 65, 18., 98% sats. Pt resting and voices no complaints. States "my temperature goes up at night". Pt is alert and oriented to person and place at this time.Marland Kitchen

## 2021-02-09 ENCOUNTER — Inpatient Hospital Stay (HOSPITAL_COMMUNITY): Payer: Medicare Other

## 2021-02-09 LAB — COMPREHENSIVE METABOLIC PANEL
ALT: 32 U/L (ref 0–44)
AST: 34 U/L (ref 15–41)
Albumin: 2.1 g/dL — ABNORMAL LOW (ref 3.5–5.0)
Alkaline Phosphatase: 101 U/L (ref 38–126)
Anion gap: 6 (ref 5–15)
BUN: 16 mg/dL (ref 8–23)
CO2: 19 mmol/L — ABNORMAL LOW (ref 22–32)
Calcium: 7.9 mg/dL — ABNORMAL LOW (ref 8.9–10.3)
Chloride: 114 mmol/L — ABNORMAL HIGH (ref 98–111)
Creatinine, Ser: 0.84 mg/dL (ref 0.44–1.00)
GFR, Estimated: >60 mL/min (ref >60)
Glucose, Bld: 97 mg/dL (ref 70–99)
Potassium: 3.6 mmol/L (ref 3.5–5.1)
Sodium: 139 mmol/L (ref 135–145)
Total Bilirubin: 0.8 mg/dL (ref 0.3–1.2)
Total Protein: 5 g/dL — ABNORMAL LOW (ref 6.5–8.1)

## 2021-02-09 LAB — PROCALCITONIN: Procalcitonin: 0.36 ng/mL

## 2021-02-09 LAB — CBC WITH DIFFERENTIAL/PLATELET
Abs Immature Granulocytes: 0.15 10*3/uL — ABNORMAL HIGH (ref 0.00–0.07)
Basophils Absolute: 0.1 10*3/uL (ref 0.0–0.1)
Basophils Relative: 1 %
Eosinophils Absolute: 0.2 10*3/uL (ref 0.0–0.5)
Eosinophils Relative: 2 %
HCT: 32.2 % — ABNORMAL LOW (ref 36.0–46.0)
Hemoglobin: 10.6 g/dL — ABNORMAL LOW (ref 12.0–15.0)
Immature Granulocytes: 2 %
Lymphocytes Relative: 25 %
Lymphs Abs: 2.3 10*3/uL (ref 0.7–4.0)
MCH: 31.4 pg (ref 26.0–34.0)
MCHC: 32.9 g/dL (ref 30.0–36.0)
MCV: 95.3 fL (ref 80.0–100.0)
Monocytes Absolute: 0.9 10*3/uL (ref 0.1–1.0)
Monocytes Relative: 10 %
Neutro Abs: 5.7 10*3/uL (ref 1.7–7.7)
Neutrophils Relative %: 60 %
Platelets: 189 10*3/uL (ref 150–400)
RBC: 3.38 MIL/uL — ABNORMAL LOW (ref 3.87–5.11)
RDW: 13.5 % (ref 11.5–15.5)
WBC: 9.3 10*3/uL (ref 4.0–10.5)
nRBC: 0 % (ref 0.0–0.2)

## 2021-02-09 LAB — BRAIN NATRIURETIC PEPTIDE: B Natriuretic Peptide: 506.8 pg/mL — ABNORMAL HIGH (ref 0.0–100.0)

## 2021-02-09 LAB — URINE CULTURE: Culture: >=100000 — AB

## 2021-02-09 LAB — MAGNESIUM: Magnesium: 2 mg/dL (ref 1.7–2.4)

## 2021-02-09 MED ORDER — ACETAMINOPHEN 325 MG PO TABS
650.0000 mg | ORAL_TABLET | Freq: Four times a day (QID) | ORAL | Status: DC | PRN
  Administered 2021-02-09 – 2021-02-11 (×3): 650 mg via ORAL
  Filled 2021-02-09 (×3): qty 2

## 2021-02-09 MED ORDER — HALOPERIDOL LACTATE 5 MG/ML IJ SOLN: 2.0000 mg | Freq: Four times a day (QID) | INTRAMUSCULAR | Status: DC | PRN

## 2021-02-09 MED ORDER — AMLODIPINE BESYLATE 10 MG PO TABS
10.0000 mg | ORAL_TABLET | Freq: Every day | ORAL | Status: DC
  Administered 2021-02-09 – 2021-02-11 (×3): 10 mg via ORAL
  Filled 2021-02-09 (×3): qty 1

## 2021-02-09 MED ORDER — HYDRALAZINE HCL 20 MG/ML IJ SOLN: 10.0000 mg | Freq: Four times a day (QID) | INTRAMUSCULAR | Status: DC | PRN

## 2021-02-09 MED ORDER — SODIUM CHLORIDE 0.9 % IV SOLN
2.0000 g | INTRAVENOUS | Status: DC
  Administered 2021-02-09: 2 g via INTRAVENOUS
  Filled 2021-02-09: qty 20

## 2021-02-09 NOTE — TOC Progression Note (Signed)
Transition of Care [facility]) - Progression Note    Patient Details  Name: EVERLEE QUAKENBUSH MRN: [id number] Date of Birth: 1933/04/24  [facility] CM/SW [place] Phone Number: 02/09/2021, 1:08 PM  Clinical Narrative:    CSW spoke with Jenny Reichmann from [facility] [phone].  Pt can [facility] to [facility] on Tuesday.  Will need hard scripts for any new medications.  TOC will continue to assist with disposition planning.        Expected Discharge Plan and Services                                                 Social Determinants of Health (SDOH) Interventions    Readmission Risk Interventions No flowsheet data found.

## 2021-02-09 NOTE — Progress Notes (Addendum)
PROGRESS NOTE                                                                                                                                                                                                             Patient Demographics:    Suzanne Monroe, is a 87 y.o. female, DOB - [birth year], YBO:[id number]  Outpatient Primary MD for the patient is Copland, Gay Filler, MD    LOS - 3  Admit date - 02/06/2021    Chief Complaint  Patient presents with   Diarrhea       Brief Narrative (HPI from H&P) - Suzanne Monroe is a 87 y.o. female with PMH of HLD, depression, memory loss, IBS with chronic diarrhea, HTN who presents for worsening diarrhea and fall related to generalized weakness, in the ER she was dosed with UTI, dehydration and AKI.   Subjective:   Patient in bed, appears comfortable, denies any headache, no fever, no chest pain or pressure, no shortness of breath , no abdominal pain. No new focal weakness.    Assessment  & Plan :     E.Coli UTI causing toxic encephalopathy in a patient with underlying dementia - History of recurrent UTIs, currently on IV cephalosporin, culture and sensitivity noted.  CT head negative no focal deficits.   Acute on Chronic Diarrhea - history of C. difficile in the past, C. difficile this admission has been clinically ruled out no BM in 36 hours.   AKI - due to dehydration hold Lasix and ACE inhibitor, hydrate and monitor.    HTN - BP currently soft hold blood pressure medications   Dementia - continue Aricept, risk for delirium, family educated, avoid benzodiazepines and narcotics, use Haldol & mittens as needed if needed.   Depression/Anxiety  - continue home Remeron, Seroquel, Paxil   Hypokalemia.  Replaced.      Condition - Fair  Family Communication  :    Called daughter Suzanne Monroe [phone] on 02/07/2021 at 9:52 AM.  No response, called on 02/08/2021 twice at 9:53 and 9:56 AM  busy signal, 2nd number [phone] - 02/09/21 - message left at 10:48 AM.  Called daughter Suzanne Monroe [phone] updated on 02/07/2021 and 02/08/2021, 02/09/21  Code Status :  Full  Consults  :  None  PUD Prophylaxis :    Procedures  :  CT head - Non acute  CT Abd - Pelvis -   1. Colonic diverticulosis without diverticulitis. Liquid stool in the ascending and transverse colon, can be seen with diarrheal illness. 2. Mild prominence of the left renal collecting system with left perinephric edema, recommend correlation with urinalysis to exclude urinary tract infection. No urolithiasis. 3. Small left and trace right pleural effusions. Small pericardial effusion. 4. Small uterine fibroids. Aortic Atherosclerosis (ICD10-I70.0).      Disposition Plan  :    Status is: Inpatient  Remains inpatient appropriate because:IV treatments appropriate due to intensity of illness or inability to take PO  Dispo: The patient is from: Home              Anticipated d/c is to: Home              Patient currently is not medically stable to d/c.   Difficult to place patient No   DVT Prophylaxis  :    enoxaparin (LOVENOX) injection 30 mg Start: 02/07/21 1400  Lab Results  Component Value Date   PLT 189 02/09/2021    Diet :  Diet Order             Diet regular Room service appropriate? Yes; Fluid consistency: Thin  Diet effective now                    Inpatient Medications  Scheduled Meds:  amLODipine  10 mg Oral Daily   donepezil  5 mg Oral QHS   enoxaparin (LOVENOX) injection  30 mg Subcutaneous Daily   feeding supplement   Oral TID BM   mirtazapine  15 mg Oral QHS   PARoxetine  20 mg Oral Daily   QUEtiapine  25 mg Oral QHS   Continuous Infusions:  sodium chloride 100 mL/hr at 02/08/21 2113   PRN Meds:.  Antibiotics  :    Anti-infectives (From admission, onward)    Start     Dose/Rate Route Frequency Ordered Stop   02/07/21 2330  cefTRIAXone (ROCEPHIN) 1 g in sodium  chloride 0.9 % 100 mL IVPB        1 g 200 mL/hr over 30 Minutes Intravenous  Once 02/07/21 0603 02/07/21 2350   02/06/21 2300  cefTRIAXone (ROCEPHIN) 1 g in sodium chloride 0.9 % 100 mL IVPB        1 g 200 mL/hr over 30 Minutes Intravenous  Once 02/06/21 2251 02/06/21 2351        Time Spent in minutes  30   Lala Lund M.D on 02/09/2021 at 10:43 AM  To page go to [email or web address]   [facility] -  Office  [phone]  See all Orders from today for further details    Objective:   Vitals:   02/08/21 1435 02/08/21 2109 02/09/21 0600 02/09/21 0700  BP: 107/88 (!) 178/60  (!) 186/66  Pulse: 67 65  66  Resp:  18  18  Temp: 99.2 F (37.3 C) (!) 101.4 F (38.6 C) 98.9 F (37.2 C) 97.7 F (36.5 C)  TempSrc: Oral Oral Oral Oral  SpO2: 98%   98%  Weight:      Height:        Wt Readings from Last 3 Encounters:  02/06/21 55 kg  03/10/17 55 kg  11/10/16 58.2 kg     Intake/Output Summary (Last 24 hours) at 02/09/2021 1043 Last data filed at 02/09/2021 0900 Gross per 24 hour  Intake 120 ml  Output --  Net 120  ml      Physical Exam  Awake but mildly confused, No new F.N deficits,   [place].AT,PERRAL Supple Neck,No JVD, No cervical lymphadenopathy appriciated.  Symmetrical Chest wall movement, Good air movement bilaterally, CTAB RRR,No Gallops, Rubs or new Murmurs, No Parasternal Heave +ve B.Sounds, Abd Soft, No tenderness, No organomegaly appriciated, No rebound - guarding or rigidity. No Cyanosis, Clubbing or edema, No new Rash or bruise   RN pressure injury documentation:     Data Review:    CBC Recent Labs  Lab 02/06/21 2020 02/06/21 2134 02/07/21 0730 02/08/21 0138 02/09/21 0318  WBC 1.3* 12.5* 12.2* 10.5 9.3  HGB 12.1 12.5 10.9* 10.7* 10.6*  HCT 38.8 38.3 33.1* 32.1* 32.2*  PLT 7* 144* 142* 155 189  MCV 102.6* 97.2 95.1 95.5 95.3  MCH 32.0 31.7 31.3 31.8 31.4  MCHC 31.2 32.6 32.9 33.3 32.9  RDW 13.4 13.5 13.6 13.6 13.5  LYMPHSABS 0.9 1.7  1.7 2.1 2.3  MONOABS 0.1 1.7* 1.6* 1.1* 0.9  EOSABS 0.0 0.0 0.1 0.1 0.2  BASOSABS 0.0 0.0 0.0 0.0 0.1    Recent Labs  Lab 02/06/21 1330 02/07/21 0730 02/07/21 0835 02/08/21 0138 02/09/21 0318  NA 137  --  137 138 139  K 3.8  --  3.2* 3.7 3.6  CL 105  --  108 112* 114*  CO2 24  --  21* 19* 19*  GLUCOSE 109*  --  106* 114* 97  BUN 35*  --  25* 19 16  CREATININE 1.70*  --  1.13* 0.91 0.84  CALCIUM 8.2*  --  7.5* 7.5* 7.9*  AST 35  --  27 43* 34  ALT 23  --  23 31 32  ALKPHOS 85  --  96 98 101  BILITOT 1.0  --  0.9 0.5 0.8  ALBUMIN 2.7*  --  2.3* 2.1* 2.1*  MG  --   --  2.1 2.0 2.0  DDIMER  --   --  3.12*  --   --   PROCALCITON  --   --  0.79 0.44 0.36  BNP  --  365.1*  --  404.5* 506.8*    ------------------------------------------------------------------------------------------------------------------ No results for input(s): CHOL, HDL, LDLCALC, TRIG, CHOLHDL, LDLDIRECT in the last 72 hours.  No results found for: HGBA1C ------------------------------------------------------------------------------------------------------------------ No results for input(s): TSH, T4TOTAL, T3FREE, THYROIDAB in the last 72 hours.  Invalid input(s): FREET3  Cardiac Enzymes No results for input(s): CKMB, TROPONINI, MYOGLOBIN in the last 168 hours.  Invalid input(s): CK ------------------------------------------------------------------------------------------------------------------    Component Value Date/Time   BNP 506.8 (H) 02/09/2021 0623    Micro Results Recent Results (from the past 240 hour(s))  Urine culture     Status: Abnormal   Collection Time: 02/06/21  1:31 PM   Specimen: Urine, Random  Result Value Ref Range Status   Specimen Description URINE, RANDOM  Final   Special Requests   Final    NONE Performed at [facility], [address] [place], [place] [address]    Culture >=100,000 COLONIES/mL ESCHERICHIA COLI (A)  Final   Report Status 02/09/2021 FINAL   Final   Organism ID, Bacteria ESCHERICHIA COLI (A)  Final      Susceptibility   Escherichia coli - MIC*    AMPICILLIN 8 SENSITIVE Sensitive     CEFAZOLIN <=4 SENSITIVE Sensitive     CEFEPIME <=0.12 SENSITIVE Sensitive     CEFTRIAXONE <=0.25 SENSITIVE Sensitive     CIPROFLOXACIN <=0.25 SENSITIVE Sensitive  GENTAMICIN <=1 SENSITIVE Sensitive     IMIPENEM <=0.25 SENSITIVE Sensitive     NITROFURANTOIN <=16 SENSITIVE Sensitive     TRIMETH/SULFA <=20 SENSITIVE Sensitive     AMPICILLIN/SULBACTAM 4 SENSITIVE Sensitive     PIP/TAZO <=4 SENSITIVE Sensitive     * >=100,000 COLONIES/mL ESCHERICHIA COLI  Resp Panel by RT-PCR (Flu A&B, Covid) Nasopharyngeal Swab     Status: None   Collection Time: 02/06/21  7:59 PM   Specimen: Nasopharyngeal Swab; Nasopharyngeal(NP) swabs in vial transport medium  Result Value Ref Range Status   SARS Coronavirus 2 by RT PCR NEGATIVE NEGATIVE Final    Comment: (NOTE) SARS-CoV-2 target nucleic acids are NOT DETECTED.  The SARS-CoV-2 RNA is generally detectable in upper respiratory specimens during the acute phase of infection. The lowest concentration of SARS-CoV-2 viral copies this assay can detect is 138 copies/mL. A negative result does not preclude SARS-Cov-2 infection and should not be used as the sole basis for treatment or other patient management decisions. A negative result may occur with  improper specimen collection/handling, submission of specimen other than nasopharyngeal swab, presence of viral mutation(s) within the areas targeted by this assay, and inadequate number of viral copies(<138 copies/mL). A negative result must be combined with clinical observations, patient history, and epidemiological information. The expected result is Negative.  Fact Sheet for Patients:  EntrepreneurPulse.com.au  Fact Sheet for Healthcare Providers:  IncredibleEmployment.be  This test is no t yet approved or cleared by the  [place] FDA and  has been authorized for detection and/or diagnosis of SARS-CoV-2 by FDA under an Emergency Use Authorization (EUA). This EUA will remain  in effect (meaning this test can be used) for the duration of the COVID-19 declaration under Section 564(b)(1) of the Act, 21 U.S.C.section 360bbb-3(b)(1), unless the authorization is terminated  or revoked sooner.       Influenza A by PCR NEGATIVE NEGATIVE Final   Influenza B by PCR NEGATIVE NEGATIVE Final    Comment: (NOTE) The Xpert Xpress SARS-CoV-2/FLU/RSV plus assay is intended as an aid in the diagnosis of influenza from Nasopharyngeal swab specimens and should not be used as a sole basis for treatment. Nasal washings and aspirates are unacceptable for Xpert Xpress SARS-CoV-2/FLU/RSV testing.  Fact Sheet for Patients: EntrepreneurPulse.com.au  Fact Sheet for Healthcare Providers: IncredibleEmployment.be  This test is not yet approved or cleared by the [place] FDA and has been authorized for detection and/or diagnosis of SARS-CoV-2 by FDA under an Emergency Use Authorization (EUA). This EUA will remain in effect (meaning this test can be used) for the duration of the COVID-19 declaration under Section 564(b)(1) of the Act, 21 U.S.C. section 360bbb-3(b)(1), unless the authorization is terminated or revoked.  Performed at [facility], [address] [place], [place] [address]     Radiology Reports CT ABDOMEN PELVIS WO CONTRAST  Result Date: 02/06/2021 CLINICAL DATA:  87 year old with fall in the shower yesterday due to weakness. Diarrhea. Diverticulitis suspected. EXAM: CT ABDOMEN AND PELVIS WITHOUT CONTRAST TECHNIQUE: Multidetector CT imaging of the abdomen and pelvis was performed following the standard protocol without IV contrast. COMPARISON:  None. FINDINGS: Lower chest: Small left pleural effusion with adjacent linear atelectasis in the left lower lobe. Trace  right pleural effusion. Small pericardial effusion. Normal heart size. Hepatobiliary: No evidence of focal hepatic abnormality on this noncontrast exam. Cholecystectomy. Expected biliary prominence postcholecystectomy. No visualized choledocholithiasis. No evidence of hepatic injury on this noncontrast exam. Pancreas: No ductal dilatation or inflammation. Spleen: Normal  in size. No focal abnormality or perisplenic hematoma. Adrenals/Urinary Tract: Adrenal thickening without dominant nodule or adrenal hemorrhage. Mild prominence of the left renal pelvis with perinephric edema. No visualized urolithiasis. Subcentimeter hyperdense focus in the lower left kidney is likely hyperdense cyst. 18 mm cyst in the upper left kidney. Minimal right perinephric edema without right hydronephrosis or renal calculi. No focal right renal abnormality. Partially distended urinary bladder. No bladder wall thickening. Stomach/Bowel: Tiny hiatal hernia. The stomach is otherwise unremarkable. There is no small bowel obstruction or inflammation. Minimal distal small bowel diverticulosis. Multifocal colonic diverticulosis, prominent involving the sigmoid. No evidence of diverticulitis or acute colonic inflammation. There is liquid stool in the ascending and transverse colon. Appendix not definitively seen. Vascular/Lymphatic: Aortic atherosclerosis without aneurysm. No retroperitoneal fluid or edema. No abdominopelvic adenopathy. Reproductive: Small uterine fibroids some of which are calcified. No adnexal mass. Other: No free air or ascites. There is a small fat containing umbilical hernia without inflammation. Musculoskeletal: Advanced degenerative disc disease at L3-L4 with additional degenerative disc disease at multiple levels. Bilateral hip osteoarthritis. There are no acute or suspicious osseous abnormalities. IMPRESSION: 1. Colonic diverticulosis without diverticulitis. Liquid stool in the ascending and transverse colon, can be seen  with diarrheal illness. 2. Mild prominence of the left renal collecting system with left perinephric edema, recommend correlation with urinalysis to exclude urinary tract infection. No urolithiasis. 3. Small left and trace right pleural effusions. Small pericardial effusion. 4. Small uterine fibroids. Aortic Atherosclerosis (ICD10-I70.0). Electronically Signed   By: Keith Rake M.D.   On: 02/06/2021 17:17   CT Head Wo Contrast  Result Date: 02/06/2021 CLINICAL DATA:  Fall.  Head injury EXAM: CT HEAD WITHOUT CONTRAST TECHNIQUE: Contiguous axial images were obtained from the base of the skull through the vertex without intravenous contrast. COMPARISON:  CT head 06/27/2008 FINDINGS: Brain: Moderate atrophy with progression. Negative for acute infarct, hemorrhage, mass. Vascular: Negative for hyperdense vessel Skull: Negative for skull fracture Sinuses/Orbits: Extensive mucosal edema in the paranasal sinuses. Bony thickening and contraction of left maxillary sinus. Negative orbit Other: None IMPRESSION: Moderate atrophy.  No acute intracranial abnormality. Electronically Signed   By: Franchot Gallo M.D.   On: 02/06/2021 17:14

## 2021-02-10 LAB — CBC WITH DIFFERENTIAL/PLATELET
Abs Immature Granulocytes: 0.19 10*3/uL — ABNORMAL HIGH (ref 0.00–0.07)
Basophils Absolute: 0.1 10*3/uL (ref 0.0–0.1)
Basophils Relative: 1 %
Eosinophils Absolute: 0.2 10*3/uL (ref 0.0–0.5)
Eosinophils Relative: 2 %
HCT: 37.2 % (ref 36.0–46.0)
Hemoglobin: 12.6 g/dL (ref 12.0–15.0)
Immature Granulocytes: 2 %
Lymphocytes Relative: 22 %
Lymphs Abs: 2.5 10*3/uL (ref 0.7–4.0)
MCH: 31.6 pg (ref 26.0–34.0)
MCHC: 33.9 g/dL (ref 30.0–36.0)
MCV: 93.2 fL (ref 80.0–100.0)
Monocytes Absolute: 1 10*3/uL (ref 0.1–1.0)
Monocytes Relative: 9 %
Neutro Abs: 7.4 10*3/uL (ref 1.7–7.7)
Neutrophils Relative %: 64 %
Platelets: 243 10*3/uL (ref 150–400)
RBC: 3.99 MIL/uL (ref 3.87–5.11)
RDW: 13.5 % (ref 11.5–15.5)
WBC: 11.5 10*3/uL — ABNORMAL HIGH (ref 4.0–10.5)
nRBC: 0 % (ref 0.0–0.2)

## 2021-02-10 LAB — COMPREHENSIVE METABOLIC PANEL
ALT: 29 U/L (ref 0–44)
AST: 29 U/L (ref 15–41)
Albumin: 2.2 g/dL — ABNORMAL LOW (ref 3.5–5.0)
Alkaline Phosphatase: 117 U/L (ref 38–126)
Anion gap: 8 (ref 5–15)
BUN: 11 mg/dL (ref 8–23)
CO2: 21 mmol/L — ABNORMAL LOW (ref 22–32)
Calcium: 8.1 mg/dL — ABNORMAL LOW (ref 8.9–10.3)
Chloride: 111 mmol/L (ref 98–111)
Creatinine, Ser: 0.78 mg/dL (ref 0.44–1.00)
GFR, Estimated: >60 mL/min (ref >60)
Glucose, Bld: 93 mg/dL (ref 70–99)
Potassium: 3.7 mmol/L (ref 3.5–5.1)
Sodium: 140 mmol/L (ref 135–145)
Total Bilirubin: 0.1 mg/dL — ABNORMAL LOW (ref 0.3–1.2)
Total Protein: 5.4 g/dL — ABNORMAL LOW (ref 6.5–8.1)

## 2021-02-10 LAB — MAGNESIUM: Magnesium: 2 mg/dL (ref 1.7–2.4)

## 2021-02-10 LAB — PROCALCITONIN: Procalcitonin: 0.19 ng/mL

## 2021-02-10 LAB — BRAIN NATRIURETIC PEPTIDE: B Natriuretic Peptide: 133.4 pg/mL — ABNORMAL HIGH (ref 0.0–100.0)

## 2021-02-10 MED ORDER — ENOXAPARIN SODIUM 40 MG/0.4ML IJ SOSY
40.0000 mg | PREFILLED_SYRINGE | Freq: Every day | INTRAMUSCULAR | Status: DC
  Administered 2021-02-11: 40 mg via SUBCUTANEOUS
  Filled 2021-02-10: qty 0.4

## 2021-02-10 MED ORDER — LISINOPRIL 20 MG PO TABS
20.0000 mg | ORAL_TABLET | Freq: Every day | ORAL | Status: DC
  Administered 2021-02-10 – 2021-02-11 (×2): 20 mg via ORAL
  Filled 2021-02-10 (×2): qty 1

## 2021-02-10 MED ORDER — SODIUM CHLORIDE 0.9 % IV SOLN
1.0000 g | INTRAVENOUS | Status: DC
  Administered 2021-02-10 – 2021-02-11 (×2): 1 g via INTRAVENOUS
  Filled 2021-02-10 (×2): qty 10

## 2021-02-10 NOTE — Progress Notes (Signed)
PROGRESS NOTE                                                                                                                                                                                                             Patient Demographics:    Suzanne Monroe, is a 87 y.o. female, DOB - Aug 20, 1933, TJQ:[id number]  Outpatient Primary MD for the patient is Copland, Gay Filler, MD    LOS - 4  Admit date - 02/06/2021    Chief Complaint  Patient presents with   Diarrhea       Brief Narrative (HPI from H&P) - Suzanne Monroe is a 87 y.o. female with PMH of HLD, depression, memory loss, IBS with chronic diarrhea, HTN who presents for worsening diarrhea and fall related to generalized weakness, in the ER she was dosed with UTI, dehydration and AKI.   Subjective:   Patient in bed, appears comfortable, denies any headache, no fever, no chest pain or pressure, no shortness of breath , no abdominal pain. No new focal weakness.   Assessment  & Plan :     E.Coli UTI causing toxic encephalopathy in a patient with underlying dementia - History of recurrent UTIs, currently on IV cephalosporin, culture and sensitivity noted.  CT head negative no focal deficits. Much better likely DC 02/10/21.   Acute on Chronic Diarrhea - history of C. difficile in the past, C. difficile this admission has been clinically ruled out no BM in 36 hours.   AKI - due to dehydration hold Lasix and ACE inhibitor, hydrated - resolved.   HTN - BP currently soft hold blood pressure medications   Dementia - continue Aricept, risk for delirium, family educated, avoid benzodiazepines and narcotics, use Haldol & mittens as needed if needed.   Depression/Anxiety  - continue home Remeron, Seroquel, Paxil   Hypokalemia.  Replaced.      Condition - Fair  Family Communication  :    Called daughter [name] [phone] on 02/07/2021 at 9:52 AM.  No response, called on  02/08/2021 twice at 9:53 and 9:56 AM busy signal, 2nd number [phone] - 02/09/21 - message left at 10:48 AM, 02/10/21  Called daughter Sarah [phone] updated on 02/07/2021 and 02/08/2021, 02/09/21  Code Status :  Full  Consults  :  None  PUD Prophylaxis :    Procedures  :  CT head - Non acute  CT Abd - Pelvis -   1. Colonic diverticulosis without diverticulitis. Liquid stool in the ascending and transverse colon, can be seen with diarrheal illness. 2. Mild prominence of the left renal collecting system with left perinephric edema, recommend correlation with urinalysis to exclude urinary tract infection. No urolithiasis. 3. Small left and trace right pleural effusions. Small pericardial effusion. 4. Small uterine fibroids. Aortic Atherosclerosis (ICD10-I70.0).      Disposition Plan  :    Status is: Inpatient  Remains inpatient appropriate because:IV treatments appropriate due to intensity of illness or inability to take PO  Dispo: The patient is from: Home              Anticipated d/c is to: Home              Patient currently is not medically stable to d/c.   Difficult to place patient No   DVT Prophylaxis  :    enoxaparin (LOVENOX) injection 30 mg Start: 02/07/21 1400  Lab Results  Component Value Date   PLT 243 02/10/2021    Diet :  Diet Order             DIET SOFT Room service appropriate? Yes; Fluid consistency: Thin  Diet effective now                    Inpatient Medications  Scheduled Meds:  amLODipine  10 mg Oral Daily   donepezil  5 mg Oral QHS   enoxaparin (LOVENOX) injection  30 mg Subcutaneous Daily   feeding supplement   Oral TID BM   lisinopril  20 mg Oral Daily   mirtazapine  15 mg Oral QHS   PARoxetine  20 mg Oral Daily   QUEtiapine  25 mg Oral QHS   Continuous Infusions:  cefTRIAXone (ROCEPHIN)  IV 1 g (02/10/21 0922)   PRN Meds:.  Antibiotics  :    Anti-infectives (From admission, onward)    Start     Dose/Rate Route  Frequency Ordered Stop   02/10/21 0900  cefTRIAXone (ROCEPHIN) 1 g in sodium chloride 0.9 % 100 mL IVPB        1 g 200 mL/hr over 30 Minutes Intravenous Every 24 hours 02/10/21 0716     02/09/21 1645  cefTRIAXone (ROCEPHIN) 2 g in sodium chloride 0.9 % 100 mL IVPB  Status:  Discontinued        2 g 200 mL/hr over 30 Minutes Intravenous Every 24 hours 02/09/21 1550 02/10/21 0716   02/07/21 2330  cefTRIAXone (ROCEPHIN) 1 g in sodium chloride 0.9 % 100 mL IVPB        1 g 200 mL/hr over 30 Minutes Intravenous  Once 02/07/21 0603 02/07/21 2350   02/06/21 2300  cefTRIAXone (ROCEPHIN) 1 g in sodium chloride 0.9 % 100 mL IVPB        1 g 200 mL/hr over 30 Minutes Intravenous  Once 02/06/21 2251 02/06/21 2351        Time Spent in minutes  30   Lala Lund M.D on 02/10/2021 at 9:34 AM  To page go to [email or web address]   [facility] -  Office  [phone]  See all Orders from today for further details    Objective:   Vitals:   02/09/21 1558 02/09/21 1850 02/09/21 2117 02/10/21 0725  BP:   (!) 147/59 (!) 175/82  Pulse:   75 89  Resp:   16 18  Temp: 98.5 F (36.9  C) 98.6 F (37 C) 98.1 F (36.7 C) 97.7 F (36.5 C)  TempSrc: Oral Oral  Axillary  SpO2:   98% 98%  Weight:      Height:        Wt Readings from Last 3 Encounters:  02/06/21 55 kg  03/10/17 55 kg  11/10/16 58.2 kg     Intake/Output Summary (Last 24 hours) at 02/10/2021 0934 Last data filed at 02/10/2021 0352 Gross per 24 hour  Intake 4144.26 ml  Output 1100 ml  Net 3044.26 ml      Physical Exam  Awake but mildly confused, No new F.N deficits,   [place].AT,PERRAL Supple Neck,No JVD, No cervical lymphadenopathy appriciated.  Symmetrical Chest wall movement, Good air movement bilaterally, CTAB RRR,No Gallops, Rubs or new Murmurs, No Parasternal Heave +ve B.Sounds, Abd Soft, No tenderness, No organomegaly appriciated, No rebound - guarding or rigidity. No Cyanosis, Clubbing or edema, No new Rash or  bruise    RN pressure injury documentation:     Data Review:    CBC Recent Labs  Lab 02/06/21 2134 02/07/21 0730 02/08/21 0138 02/09/21 0318 02/10/21 0416  WBC 12.5* 12.2* 10.5 9.3 11.5*  HGB 12.5 10.9* 10.7* 10.6* 12.6  HCT 38.3 33.1* 32.1* 32.2* 37.2  PLT 144* 142* 155 189 243  MCV 97.2 95.1 95.5 95.3 93.2  MCH 31.7 31.3 31.8 31.4 31.6  MCHC 32.6 32.9 33.3 32.9 33.9  RDW 13.5 13.6 13.6 13.5 13.5  LYMPHSABS 1.7 1.7 2.1 2.3 2.5  MONOABS 1.7* 1.6* 1.1* 0.9 1.0  EOSABS 0.0 0.1 0.1 0.2 0.2  BASOSABS 0.0 0.0 0.0 0.1 0.1    Recent Labs  Lab 02/06/21 1330 02/07/21 0730 02/07/21 0835 02/08/21 0138 02/09/21 0318 02/10/21 0416 02/10/21 0555 02/10/21 0759  NA 137  --  137 138 139 140  --   --   K 3.8  --  3.2* 3.7 3.6 3.7  --   --   CL 105  --  108 112* 114* 111  --   --   CO2 24  --  21* 19* 19* 21*  --   --   GLUCOSE 109*  --  106* 114* 97 93  --   --   BUN 35*  --  25* 19 16 11   --   --   CREATININE 1.70*  --  1.13* 0.91 0.84 0.78  --   --   CALCIUM 8.2*  --  7.5* 7.5* 7.9* 8.1*  --   --   AST 35  --  27 43* 34 29  --   --   ALT 23  --  23 31 32 29  --   --   ALKPHOS 85  --  96 98 101 117  --   --   BILITOT 1.0  --  0.9 0.5 0.8 0.1*  --   --   ALBUMIN 2.7*  --  2.3* 2.1* 2.1* 2.2*  --   --   MG  --   --  2.1 2.0 2.0 2.0  --   --   DDIMER  --   --  3.12*  --   --   --   --   --   PROCALCITON  --   --  0.79 0.44 0.36  --   --  0.19  BNP  --  365.1*  --  404.5* 506.8*  --  133.4*  --     ------------------------------------------------------------------------------------------------------------------ No results for input(s): CHOL, HDL, LDLCALC, TRIG, CHOLHDL,  LDLDIRECT in the last 72 hours.  No results found for: HGBA1C ------------------------------------------------------------------------------------------------------------------ No results for input(s): TSH, T4TOTAL, T3FREE, THYROIDAB in the last 72 hours.  Invalid input(s): FREET3  Cardiac Enzymes No  results for input(s): CKMB, TROPONINI, MYOGLOBIN in the last 168 hours.  Invalid input(s): CK ------------------------------------------------------------------------------------------------------------------    Component Value Date/Time   BNP 133.4 (H) 02/10/2021 0555    Micro Results Recent Results (from the past 240 hour(s))  Urine culture     Status: Abnormal   Collection Time: 02/06/21  1:31 PM   Specimen: Urine, Random  Result Value Ref Range Status   Specimen Description URINE, RANDOM  Final   Special Requests   Final    NONE Performed at [facility], [address] [place], [place] [address]    Culture >=100,000 COLONIES/mL ESCHERICHIA COLI (A)  Final   Report Status 02/09/2021 FINAL  Final   Organism ID, Bacteria ESCHERICHIA COLI (A)  Final      Susceptibility   Escherichia coli - MIC*    AMPICILLIN 8 SENSITIVE Sensitive     CEFAZOLIN <=4 SENSITIVE Sensitive     CEFEPIME <=0.12 SENSITIVE Sensitive     CEFTRIAXONE <=0.25 SENSITIVE Sensitive     CIPROFLOXACIN <=0.25 SENSITIVE Sensitive     GENTAMICIN <=1 SENSITIVE Sensitive     IMIPENEM <=0.25 SENSITIVE Sensitive     NITROFURANTOIN <=16 SENSITIVE Sensitive     TRIMETH/SULFA <=20 SENSITIVE Sensitive     AMPICILLIN/SULBACTAM 4 SENSITIVE Sensitive     PIP/TAZO <=4 SENSITIVE Sensitive     * >=100,000 COLONIES/mL ESCHERICHIA COLI  Resp Panel by RT-PCR (Flu A&B, Covid) Nasopharyngeal Swab     Status: None   Collection Time: 02/06/21  7:59 PM   Specimen: Nasopharyngeal Swab; Nasopharyngeal(NP) swabs in vial transport medium  Result Value Ref Range Status   SARS Coronavirus 2 by RT PCR NEGATIVE NEGATIVE Final    Comment: (NOTE) SARS-CoV-2 target nucleic acids are NOT DETECTED.  The SARS-CoV-2 RNA is generally detectable in upper respiratory specimens during the acute phase of infection. The lowest concentration of SARS-CoV-2 viral copies this assay can detect is 138 copies/mL. A negative result does not preclude  SARS-Cov-2 infection and should not be used as the sole basis for treatment or other patient management decisions. A negative result may occur with  improper specimen collection/handling, submission of specimen other than nasopharyngeal swab, presence of viral mutation(s) within the areas targeted by this assay, and inadequate number of viral copies(<138 copies/mL). A negative result must be combined with clinical observations, patient history, and epidemiological information. The expected result is Negative.  Fact Sheet for Patients:  EntrepreneurPulse.com.au  Fact Sheet for Healthcare Providers:  IncredibleEmployment.be  This test is no t yet approved or cleared by the [place] FDA and  has been authorized for detection and/or diagnosis of SARS-CoV-2 by FDA under an Emergency Use Authorization (EUA). This EUA will remain  in effect (meaning this test can be used) for the duration of the COVID-19 declaration under Section 564(b)(1) of the Act, 21 U.S.C.section 360bbb-3(b)(1), unless the authorization is terminated  or revoked sooner.       Influenza A by PCR NEGATIVE NEGATIVE Final   Influenza B by PCR NEGATIVE NEGATIVE Final    Comment: (NOTE) The Xpert Xpress SARS-CoV-2/FLU/RSV plus assay is intended as an aid in the diagnosis of influenza from Nasopharyngeal swab specimens and should not be used as a sole basis for treatment. Nasal washings and aspirates are unacceptable for Xpert  Xpress SARS-CoV-2/FLU/RSV testing.  Fact Sheet for Patients: EntrepreneurPulse.com.au  Fact Sheet for Healthcare Providers: IncredibleEmployment.be  This test is not yet approved or cleared by the [place] FDA and has been authorized for detection and/or diagnosis of SARS-CoV-2 by FDA under an Emergency Use Authorization (EUA). This EUA will remain in effect (meaning this test can be used) for the duration of  the COVID-19 declaration under Section 564(b)(1) of the Act, 21 U.S.C. section 360bbb-3(b)(1), unless the authorization is terminated or revoked.  Performed at [facility], [place] [address] [place], [place] [address]   Culture, blood (routine x 2)     Status: None (Preliminary result)   Collection Time: 02/09/21  4:07 PM   Specimen: BLOOD  Result Value Ref Range Status   Specimen Description BLOOD LEFT HAND  Final   Special Requests   Final    BOTTLES DRAWN AEROBIC AND ANAEROBIC Blood Culture results may not be optimal due to an inadequate volume of blood received in culture bottles   Culture   Final    NO GROWTH < 12 HOURS Performed at [facility], [place] [address], [place], [place] [address]    Report Status PENDING  Incomplete  Culture, blood (routine x 2)     Status: None (Preliminary result)   Collection Time: 02/09/21  4:07 PM   Specimen: BLOOD  Result Value Ref Range Status   Specimen Description BLOOD RIGHT HAND  Final   Special Requests   Final    BOTTLES DRAWN AEROBIC AND ANAEROBIC Blood Culture results may not be optimal due to an inadequate volume of blood received in culture bottles   Culture   Final    NO GROWTH < 12 HOURS Performed at [facility], [place] [address] [place],  [address]    Report Status PENDING  Incomplete    Radiology Reports CT ABDOMEN PELVIS WO CONTRAST  Result Date: 02/06/2021 CLINICAL DATA:  87 year old with fall in the shower yesterday due to weakness. Diarrhea. Diverticulitis suspected. EXAM: CT ABDOMEN AND PELVIS WITHOUT CONTRAST TECHNIQUE: Multidetector CT imaging of the abdomen and pelvis was performed following the standard protocol without IV contrast. COMPARISON:  None. FINDINGS: Lower chest: Small left pleural effusion with adjacent linear atelectasis in the left lower lobe. Trace right pleural effusion. Small pericardial effusion. Normal heart size. Hepatobiliary: No evidence of focal hepatic abnormality on this  noncontrast exam. Cholecystectomy. Expected biliary prominence postcholecystectomy. No visualized choledocholithiasis. No evidence of hepatic injury on this noncontrast exam. Pancreas: No ductal dilatation or inflammation. Spleen: Normal in size. No focal abnormality or perisplenic hematoma. Adrenals/Urinary Tract: Adrenal thickening without dominant nodule or adrenal hemorrhage. Mild prominence of the left renal pelvis with perinephric edema. No visualized urolithiasis. Subcentimeter hyperdense focus in the lower left kidney is likely hyperdense cyst. 18 mm cyst in the upper left kidney. Minimal right perinephric edema without right hydronephrosis or renal calculi. No focal right renal abnormality. Partially distended urinary bladder. No bladder wall thickening. Stomach/Bowel: Tiny hiatal hernia. The stomach is otherwise unremarkable. There is no small bowel obstruction or inflammation. Minimal distal small bowel diverticulosis. Multifocal colonic diverticulosis, prominent involving the sigmoid. No evidence of diverticulitis or acute colonic inflammation. There is liquid stool in the ascending and transverse colon. Appendix not definitively seen. Vascular/Lymphatic: Aortic atherosclerosis without aneurysm. No retroperitoneal fluid or edema. No abdominopelvic adenopathy. Reproductive: Small uterine fibroids some of which are calcified. No adnexal mass. Other: No free air or ascites. There is a small fat  containing umbilical hernia without inflammation. Musculoskeletal: Advanced degenerative disc disease at L3-L4 with additional degenerative disc disease at multiple levels. Bilateral hip osteoarthritis. There are no acute or suspicious osseous abnormalities. IMPRESSION: 1. Colonic diverticulosis without diverticulitis. Liquid stool in the ascending and transverse colon, can be seen with diarrheal illness. 2. Mild prominence of the left renal collecting system with left perinephric edema, recommend correlation with  urinalysis to exclude urinary tract infection. No urolithiasis. 3. Small left and trace right pleural effusions. Small pericardial effusion. 4. Small uterine fibroids. Aortic Atherosclerosis (ICD10-I70.0). Electronically Signed   By: Keith Rake M.D.   On: 02/06/2021 17:17   CT Head Wo Contrast  Result Date: 02/06/2021 CLINICAL DATA:  Fall.  Head injury EXAM: CT HEAD WITHOUT CONTRAST TECHNIQUE: Contiguous axial images were obtained from the base of the skull through the vertex without intravenous contrast. COMPARISON:  CT head 06/27/2008 FINDINGS: Brain: Moderate atrophy with progression. Negative for acute infarct, hemorrhage, mass. Vascular: Negative for hyperdense vessel Skull: Negative for skull fracture Sinuses/Orbits: Extensive mucosal edema in the paranasal sinuses. Bony thickening and contraction of left maxillary sinus. Negative orbit Other: None IMPRESSION: Moderate atrophy.  No acute intracranial abnormality. Electronically Signed   By: Franchot Gallo M.D.   On: 02/06/2021 17:14   DG Chest Port 1 View  Result Date: 02/09/2021 CLINICAL DATA:  Shortness of breath. EXAM: PORTABLE CHEST 1 VIEW COMPARISON:  Chest radiograph 09/15/2009. Lung bases from abdominal CT 02/06/2021 FINDINGS: Retrocardiac opacity corresponds to small left pleural effusion and basilar opacity on abdominal CT. This is not significantly changed allowing for differences in modality. There may be a trace right pleural effusion. Upper normal heart size with normal mediastinal contours and aortic atherosclerosis. No pulmonary edema or pneumothorax. IMPRESSION: Retrocardiac opacity corresponds to small left pleural effusion and basilar opacity on recent abdominal CT. This is not significantly changed allowing for differences in modality. Electronically Signed   By: Keith Rake M.D.   On: 02/09/2021 16:33

## 2021-02-10 NOTE — Plan of Care (Signed)
  Problem: Clinical Measurements: Goal: Ability to maintain clinical measurements within normal limits will improve Outcome: Progressing Goal: Will remain free from infection Outcome: Progressing   

## 2021-02-10 NOTE — Plan of Care (Signed)
  Problem: Pain Managment: Goal: General experience of comfort will improve Outcome: Progressing   Problem: Safety: Goal: Ability to remain free from injury will improve Outcome: Progressing   Problem: Skin Integrity: Goal: Risk for impaired skin integrity will decrease Outcome: Progressing   

## 2021-02-10 NOTE — Progress Notes (Signed)
Physical Therapy Treatment Patient Details Name: Suzanne Monroe MRN: [id number] DOB: 26-Aug-1932 Today's Date: 02/10/2021    History of Present Illness Pt is a 87 y.o. F who presents with worsening diarrhea, fall related to generalized weakness. In the ED, she was diagnosed with UTI, dehydration, AKI. Significant PMH: dementia, IBS with chronic diarrhea, HTN.    PT Comments    Pt received attempting to get up from chair and chair alarm sounding, NT in room as well. Pt agreeable to short gait task in room but somewhat agitated and after toileting refusing to attempt hallway ambulation. Pt with poor safety awareness but agreeable to use RW and needs min guard for safety/balance with AD for gait/transfers. Pt continues to benefit from PT services to progress toward functional mobility goals.   Follow Up Recommendations  Home health PT;Supervision/Assistance - 24 hour     Equipment Recommendations  None recommended by PT    Recommendations for Other Services       Precautions / Restrictions Precautions Precautions: Fall Precaution Comments: dementia, can get agitated easily Restrictions Weight Bearing Restrictions: No    Mobility  Bed Mobility Overal bed mobility: Needs Assistance Bed Mobility: Sit to Supine       Sit to supine: Min guard   General bed mobility comments: assist mostly for management of pt IV line, pt not needing physical assist to get into bed.    Transfers Overall transfer level: Needs assistance Equipment used: Rolling walker (2 wheeled) Transfers: Sit to/from Stand Sit to Stand: Min guard         General transfer comment: Min guard to rise from chair and toilet heights.  Ambulation/Gait Ambulation/Gait assistance: Min guard Gait Distance (Feet): 15 Feet (33ft to bathroom, then 17ft back to bed) Assistive device: Rolling walker (2 wheeled) Gait Pattern/deviations: Step-through pattern;Decreased stride length Gait velocity: decreased   General  Gait Details: Cues for environmental negotiation, min guard for balance, no overt LOB.  Pt agreeable to use RW but unsafe with it and tending to run into objects in her way rather than allowing staff to assist with moving them and agitated when staff attempting to move things out of her way when her RW catches on things in the room (doorway, laundry bag, etc). Pt resistant to safety cueing this date.   Stairs             Wheelchair Mobility    Modified Rankin (Stroke Patients Only)       Balance Overall balance assessment: Needs assistance Sitting-balance support: Feet supported Sitting balance-Leahy Scale: Fair     Standing balance support: No upper extremity supported;During functional activity Standing balance-Leahy Scale: Poor Standing balance comment: reliant on RW for dynamic tasks and min guard for stability                            Cognition Arousal/Alertness: Awake/alert Behavior During Therapy: WFL for tasks assessed/performed;Agitated Overall Cognitive Status: History of cognitive impairments - at baseline                                 General Comments: History of dementia per chart review. Pt oriented to self and time; not oriented to place. Very impulsive with difficulty problem solving and attending to higher level tasks. Pt running into objects in room with RW but irritable when staff assisting to move things out of her way. Pt  verbally aggressive toward nursing tech staff as well.      Exercises      General Comments General comments (skin integrity, edema, etc.): VSS per chart review and pt deferring vitals assessment once in bed, requesting PTA "leave room now" after bed alarm set because she didn't want dirty blanket removed from bed. PTA gave her a new clean blanket (x2) after removing soiled one.      Pertinent Vitals/Pain Pain Assessment: No/denies pain     PT Goals (current goals can now be found in the care plan  section) Acute Rehab PT Goals Patient Stated Goal: to go home and be more independent. PT Goal Formulation: With patient Time For Goal Achievement: 02/21/21 Progress towards PT goals: Progressing toward goals    Frequency    Min 3X/week      PT Plan Current plan remains appropriate       AM-PAC PT "6 Clicks" Mobility   Outcome Measure  Help needed turning from your back to your side while in a flat bed without using bedrails?: A Little Help needed moving from lying on your back to sitting on the side of a flat bed without using bedrails?: A Little Help needed moving to and from a bed to a chair (including a wheelchair)?: A Little Help needed standing up from a chair using your arms (e.g., wheelchair or bedside chair)?: A Little Help needed to walk in hospital room?: A Little Help needed climbing 3-5 steps with a railing? : A Little 6 Click Score: 18    End of Session Equipment Utilized During Treatment: Other (comment) (pt not allowing therapist to put gait belt on her, agitated) Activity Tolerance: Treatment limited secondary to agitation Patient left: in bed;with bed alarm set;with call bell/phone within reach Nurse Communication: Mobility status;Other (comment) (pt agitated, 4 rails up for safety as well due to pt impulsivity and high fall risk) PT Visit Diagnosis: Unsteadiness on feet (R26.81);History of falling (Z91.81)     Time: 8841-6606 PT Time Calculation (min) (ACUTE ONLY): 13 min  Charges:  $Gait Training: 8-22 mins                     Marquese Burkland P., PTA Acute Rehabilitation Services Pager: [phone] Office: [place] 02/10/2021, 2:42 PM

## 2021-02-11 LAB — SARS CORONAVIRUS 2 (TAT 6-24 HRS): SARS Coronavirus 2: NEGATIVE

## 2021-02-11 MED ORDER — ENSURE ORIGINAL PO LIQD: 237.0000 mL | Freq: Three times a day (TID) | ORAL | 10 refills | Status: AC

## 2021-02-11 MED ORDER — ACETAMINOPHEN 500 MG PO TABS: 500.0000 mg | ORAL_TABLET | Freq: Four times a day (QID) | ORAL | 0 refills | Status: AC | PRN

## 2021-02-11 MED ORDER — CALCIUM CARBONATE-VITAMIN D 600-400 MG-UNIT PO TABS: 1.0000 | ORAL_TABLET | Freq: Every day | ORAL | 0 refills | Status: DC

## 2021-02-11 MED ORDER — ALUM & MAG HYDROXIDE-SIMETH 200-200-20 MG/5ML PO SUSP: 30.0000 mL | Freq: Four times a day (QID) | ORAL | 0 refills | Status: DC | PRN

## 2021-02-11 MED ORDER — GUAIFENESIN 100 MG/5ML PO SYRP: 200.0000 mg | ORAL_SOLUTION | Freq: Four times a day (QID) | ORAL | 0 refills | Status: AC | PRN

## 2021-02-11 MED ORDER — DONEPEZIL HCL 5 MG PO TABS: 5.0000 mg | ORAL_TABLET | Freq: Every day | ORAL | 0 refills | Status: AC

## 2021-02-11 MED ORDER — PROPRANOLOL HCL 60 MG PO TABS: 120.0000 mg | ORAL_TABLET | Freq: Two times a day (BID) | ORAL | 0 refills | Status: DC

## 2021-02-11 MED ORDER — TRIPLE ANTIBIOTIC 5-400-5000 EX OINT: 1.0000 "application " | TOPICAL_OINTMENT | Freq: Every day | CUTANEOUS | 0 refills | Status: DC | PRN

## 2021-02-11 MED ORDER — AMLODIPINE BESYLATE 10 MG PO TABS: 10.0000 mg | ORAL_TABLET | Freq: Every day | ORAL | 0 refills | Status: AC

## 2021-02-11 MED ORDER — PROBIOTIC BLEND PO CAPS: 1.0000 | ORAL_CAPSULE | Freq: Every day | ORAL | 0 refills | Status: DC

## 2021-02-11 MED ORDER — MINERIN CREME EX CREA: 1.0000 "application " | TOPICAL_CREAM | CUTANEOUS | Status: DC | PRN

## 2021-02-11 MED ORDER — MAGNESIUM HYDROXIDE 400 MG/5ML PO SUSP: 30.0000 mL | Freq: Every evening | ORAL | 0 refills | Status: AC | PRN

## 2021-02-11 MED ORDER — PAROXETINE HCL 20 MG PO TABS: ORAL_TABLET | ORAL | 0 refills | Status: AC

## 2021-02-11 MED ORDER — AMLODIPINE BESYLATE 10 MG PO TABS: 10.0000 mg | ORAL_TABLET | Freq: Every day | ORAL | 0 refills | Status: DC

## 2021-02-11 MED ORDER — MELATONIN 3 MG PO TABS: 3.0000 mg | ORAL_TABLET | Freq: Every day | ORAL | 0 refills | Status: AC

## 2021-02-11 MED ORDER — LISINOPRIL 20 MG PO TABS: 20.0000 mg | ORAL_TABLET | Freq: Every day | ORAL | 0 refills | Status: DC

## 2021-02-11 MED ORDER — CEPHALEXIN 500 MG PO CAPS: 500.0000 mg | ORAL_CAPSULE | Freq: Three times a day (TID) | ORAL | 0 refills | Status: DC

## 2021-02-11 MED ORDER — QUETIAPINE FUMARATE 25 MG PO TABS
25.0000 mg | ORAL_TABLET | Freq: Every day | ORAL | 0 refills | Status: AC
Start: 2021-02-11 — End: ?

## 2021-02-11 MED ORDER — FUROSEMIDE 20 MG PO TABS: 20.0000 mg | ORAL_TABLET | ORAL | 0 refills | Status: AC

## 2021-02-11 MED ORDER — LOPERAMIDE HCL 2 MG PO TABS: 4.0000 mg | ORAL_TABLET | Freq: Two times a day (BID) | ORAL | 0 refills | Status: DC

## 2021-02-11 MED ORDER — MIRTAZAPINE 15 MG PO TABS: 15.0000 mg | ORAL_TABLET | Freq: Every day | ORAL | 0 refills | Status: DC

## 2021-02-11 MED ORDER — PAROXETINE HCL 20 MG PO TABS: ORAL_TABLET | ORAL | 3 refills | Status: DC

## 2021-02-11 MED ORDER — CEPHALEXIN 500 MG PO CAPS: 500.0000 mg | ORAL_CAPSULE | Freq: Three times a day (TID) | ORAL | 0 refills | Status: AC

## 2021-02-11 MED ORDER — PROPRANOLOL HCL 60 MG PO TABS: 120.0000 mg | ORAL_TABLET | Freq: Two times a day (BID) | ORAL | 2 refills | Status: DC

## 2021-02-11 NOTE — Progress Notes (Signed)
Report given to staff at [facility]. All questions and concerns were fully addressed.

## 2021-02-11 NOTE — Care Management Important Message (Signed)
Important Message  Patient Details  Name: Suzanne Monroe MRN: [id number] Date of Birth: May 27, 1933   Medicare Important Message Given:  Yes     Edi Gorniak [address] 02/11/2021, 3:47 PM

## 2021-02-11 NOTE — Progress Notes (Signed)
Dsicharge summary packet provided to PTAR/pertinent documents Pt d/c to  [facility] as ordered. Pt remains alert/oriented in no apparent distress. No complaints.

## 2021-02-11 NOTE — Discharge Instructions (Signed)
Follow with Primary MD Copland, Gay Filler, MD in 7 days   Get CBC, CMP, 2 view Chest X ray -  checked next visit within 1 week by Primary MD or ALF MD    Activity: As tolerated with Full fall precautions use walker/cane & assistance as needed  Disposition ALF  Diet: Soft diet with feeding assistance and aspiration precautions.  Special Instructions: If you have smoked or chewed Tobacco  in the last 2 yrs please stop smoking, stop any regular Alcohol  and or any Recreational drug use.  On your next visit with your primary care physician please Get Medicines reviewed and adjusted.  Please request your Prim.MD to go over all Hospital Tests and Procedure/Radiological results at the follow up, please get all Hospital records sent to your Prim MD by signing hospital release before you go home.  If you experience worsening of your admission symptoms, develop shortness of breath, life threatening emergency, suicidal or homicidal thoughts you must seek medical attention immediately by calling 911 or calling your MD immediately  if symptoms less severe.  You Must read complete instructions/literature along with all the possible adverse reactions/side effects for all the Medicines you take and that have been prescribed to you. Take any new Medicines after you have completely understood and accpet all the possible adverse reactions/side effects.

## 2021-02-11 NOTE — Progress Notes (Signed)
Occupational Therapy Treatment Patient Details Name: Suzanne Monroe MRN: [id number] DOB: [birth year] Today's Date: 02/11/2021    History of present illness Pt is a 87 y.o. F who presents with worsening diarrhea, fall related to generalized weakness. In the ED, she was diagnosed with UTI, dehydration, AKI. Significant PMH: dementia, IBS with chronic diarrhea, HTN.   OT comments  Pt progressing well with OT goals. This session pt worked on toileting, bathing, and dressing all at min guard levels. Pt was able to use RW safely this session with no cueing on hand placement or RW safety. Pt will do well with therapy once discharged back to ALF. Acute OT to continue following while pt in the hospital.    Follow Up Recommendations  Home health OT    Equipment Recommendations  None recommended by OT    Recommendations for Other Services      Precautions / Restrictions Precautions Precautions: Fall Precaution Comments: dementia, can get agitated easily Restrictions Weight Bearing Restrictions: No       Mobility Bed Mobility Overal bed mobility: Modified Independent             General bed mobility comments: HOB elevated, no assist provided,    Transfers Overall transfer level: Needs assistance Equipment used: Rolling walker (2 wheeled) Transfers: Sit to/from Stand Sit to Stand: Min guard         General transfer comment: Pt completed multiple transfers from bed, recliner, and toilet, all at min guard level for safety.    Balance Overall balance assessment: Mild deficits observed, not formally tested                                         ADL either performed or assessed with clinical judgement   ADL Overall ADL's : Needs assistance/impaired     Grooming: Wash/dry hands;Supervision/safety;Standing Grooming Details (indicate cue type and reason): completed at sink Upper Body Bathing: Set up;Sitting Upper Body Bathing Details (indicate cue type and  reason): completed on toilet Lower Body Bathing: Min guard;Sitting/lateral leans Lower Body Bathing Details (indicate cue type and reason): completed on toilet Upper Body Dressing : Independent;Sitting Upper Body Dressing Details (indicate cue type and reason): doffed and donned clean hospital gown     Toilet Transfer: Min guard;Ambulation Toilet Transfer Details (indicate cue type and reason): Used RW, walked  into bathroom, andcompleted toileting transfer with no assist. Toileting- Clothing Manipulation and Hygiene: Min guard;Sitting/lateral lean Toileting - Clothing Manipulation Details (indicate cue type and reason): completed seated on toilet     Functional mobility during ADLs: Min guard;Rolling walker General ADL Comments: Pt using RW properly this session with no cueing for hand placement, min guard for LB ADL's and Mobility for safety.     Vision       Perception     Praxis      Cognition Arousal/Alertness: Awake/alert Behavior During Therapy: WFL for tasks assessed/performed;Agitated Overall Cognitive Status: History of cognitive impairments - at baseline                                 General Comments: History of dementia per chart review. Pt oriented to self and time; not oriented to place. Very impulsive with difficulty problem solving and attending to higher level tasks. Pt running into objects in room with RW but irritable when staff  assisting to move things out of her way. Pt verbally aggressive toward nursing tech staff as well.        Exercises     Shoulder Instructions       General Comments VSS on RA    Pertinent Vitals/ Pain       Pain Assessment: No/denies pain  Home Living                                          Prior Functioning/Environment              Frequency  Min 2X/week        Progress Toward Goals  OT Goals(current goals can now be found in the care plan section)  Progress towards OT  goals: Progressing toward goals  Acute Rehab OT Goals Patient Stated Goal: to go home and be more independent. OT Goal Formulation: With patient Time For Goal Achievement: 02/22/21 Potential to Achieve Goals: Good ADL Goals Pt Will Perform Grooming: with supervision;standing Pt Will Transfer to Toilet: with supervision;stand pivot transfer Pt Will Perform Toileting - Clothing Manipulation and hygiene: with supervision;with adaptive equipment  Plan Discharge plan remains appropriate;Frequency remains appropriate    Co-evaluation                 AM-PAC OT "6 Clicks" Daily Activity     Outcome Measure   Help from another person eating meals?: None Help from another person taking care of personal grooming?: A Little Help from another person toileting, which includes using toliet, bedpan, or urinal?: A Little Help from another person bathing (including washing, rinsing, drying)?: A Little Help from another person to put on and taking off regular upper body clothing?: None Help from another person to put on and taking off regular lower body clothing?: A Little 6 Click Score: 20    End of Session Equipment Utilized During Treatment: Rolling walker;Gait belt  OT Visit Diagnosis: Unsteadiness on feet (R26.81);Other abnormalities of gait and mobility (R26.89);Muscle weakness (generalized) (M62.81)   Activity Tolerance Patient tolerated treatment well   Patient Left in chair;with call bell/phone within reach;with chair alarm set   Nurse Communication Mobility status        Time: 8127-5170 OT Time Calculation (min): 18 min  Charges: OT General Charges $OT Visit: 1 Visit OT Treatments $Self Care/Home Management : 8-22 mins  Dorothey Oetken H., OTR/L Acute Rehabilitation  [facility] Nahla Lukin 02/11/2021, 11:48 AM

## 2021-02-11 NOTE — Discharge Summary (Addendum)
Suzanne Monroe KNL:[id number] DOB: 03-23-1933 DOA: 02/06/2021  PCP: Darreld Mclean, MD  Admit date: 02/06/2021  Discharge date: 02/11/2021  Admitted From: ALF  Disposition:  ALF   Recommendations for Outpatient Follow-up:   Follow up with PCP in 1-2 weeks  PCP Please obtain BMP/CBC, 2 view CXR in 1week,  (see Discharge instructions)   PCP Please follow up on the following pending results: Monitor BP, CMP cloasely   Home Health: PT,OT,RN   Equipment/Devices:  None Consultations: None  Discharge Condition: Stable    CODE STATUS: Full    Diet Recommendation: Soft  Diet Order             DIET SOFT Room service appropriate? Yes; Fluid consistency: Thin  Diet effective now                    Chief Complaint  Patient presents with   Diarrhea     Brief history of present illness from the day of admission and additional interim summary    Suzanne Monroe is a 87 y.o. female with PMH of HLD, depression, memory loss, IBS with chronic diarrhea, HTN who presents for worsening diarrhea and fall related to generalized weakness, in the ER she was dosed with UTI, dehydration and AKI.                                                                 Hospital Course    E.Coli UTI causing toxic encephalopathy in a patient with underlying dementia - History of recurrent UTIs, currently on IV cephalosporin and will be transition to oral Keflex for 5 more days, culture and sensitivity noted.  CT head negative no focal deficits, clinically better discharge to ALF.   Chronic Diarrhea - history of C. difficile in the past, C. difficile this admission has been clinically ruled out no BM in 36 hours.  Diarrhea have whatsoever.  On as needed Imodium which will be continued.   AKI - due to dehydration hold Lasix and ACE  inhibitor, hydrated - resolved.  PCP to monitor closely   HTN - BP improved blood pressure medications adjusted, PCP to monitor blood pressure and CMP closely.   Dementia - remains at risk for delirium Home dementia and anxiety medications continued.   Depression/Anxiety  - continue home Remeron, Seroquel, Paxil    Hypokalemia.  Replaced.   Discharge diagnosis     Active Problems:   HYPERCHOLESTEROLEMIA, PURE   Anxiety and depression   Irritable bowel syndrome   Memory loss   Essential hypertension   UTI (urinary tract infection)   Leukocytosis   AKI (acute kidney injury) [facility])    Discharge instructions    Discharge Instructions     Discharge instructions   Complete by: As directed  Follow with Primary MD Copland, Gay Filler, MD in 7 days   Get CBC, CMP, 2 view Chest X ray -  checked next visit within 1 week by Primary MD or ALF MD    Activity: As tolerated with Full fall precautions use walker/cane & assistance as needed  Disposition ALF  Diet: Soft diet with feeding assistance and aspiration precautions.  Special Instructions: If you have smoked or chewed Tobacco  in the last 2 yrs please stop smoking, stop any regular Alcohol  and or any Recreational drug use.  On your next visit with your primary care physician please Get Medicines reviewed and adjusted.  Please request your Prim.MD to go over all Hospital Tests and Procedure/Radiological results at the follow up, please get all Hospital records sent to your Prim MD by signing hospital release before you go home.  If you experience worsening of your admission symptoms, develop shortness of breath, life threatening emergency, suicidal or homicidal thoughts you must seek medical attention immediately by calling 911 or calling your MD immediately  if symptoms less severe.  You Must read complete instructions/literature along with all the possible adverse reactions/side effects for all the Medicines you take and  that have been prescribed to you. Take any new Medicines after you have completely understood and accpet all the possible adverse reactions/side effects.   Increase activity slowly   Complete by: As directed        Discharge Medications   Allergies as of 02/11/2021       Reactions   Ampicillin    REACTION: reaction not known   Cefaclor    REACTION: diarrhea   Codeine    REACTION: diarrhea   Duloxetine    REACTION: severe diarrhea   Fexofenadine    REACTION: fuzzy   Ibuprofen    REACTION: GI   Loratadine    REACTION: blurred vision   Nsaids    REACTION: fatigue        Medication List     STOP taking these medications    diphenoxylate-atropine 2.5-0.025 MG tablet Commonly known as: LOMOTIL       TAKE these medications    acetaminophen 500 MG tablet Commonly known as: TYLENOL Take 1 tablet (500 mg total) by mouth every 6 (six) hours as needed for moderate pain. What changed: Another medication with the same name was removed. Continue taking this medication, and follow the directions you see here.   alum & mag hydroxide-simeth 200-200-20 MG/5ML suspension Commonly known as: MAALOX/MYLANTA Take 30 mLs by mouth every 6 (six) hours as needed for indigestion (heartburn).   amLODipine 10 MG tablet Commonly known as: NORVASC Take 1 tablet (10 mg total) by mouth daily. What changed:  medication strength how much to take   Calcium Carbonate-Vitamin D 600-400 MG-UNIT tablet Take 1 tablet by mouth daily.   cephALEXin 500 MG capsule Commonly known as: KEFLEX Take 1 capsule (500 mg total) by mouth 3 (three) times daily for 5 days.   donepezil 5 MG tablet Commonly known as: ARICEPT Take 1 tablet (5 mg total) by mouth at bedtime.   Ensure Original Liqd Take 237 mLs by mouth 3 (three) times daily.   furosemide 20 MG tablet Commonly known as: LASIX Take 1 tablet (20 mg total) by mouth every other day.   guaifenesin 100 MG/5ML syrup Commonly known as:  ROBITUSSIN Take 10 mLs (200 mg total) by mouth every 6 (six) hours as needed for cough.   lisinopril 20 MG tablet Commonly  known as: ZESTRIL Take 1 tablet (20 mg total) by mouth daily. What changed:  medication strength how much to take   loperamide 2 MG tablet Commonly known as: IMODIUM A-D Take 2 tablets (4 mg total) by mouth in the morning and at bedtime. What changed: Another medication with the same name was removed. Continue taking this medication, and follow the directions you see here.   magnesium hydroxide 400 MG/5ML suspension Commonly known as: MILK OF MAGNESIA Take 30 mLs by mouth at bedtime as needed for mild constipation.   melatonin 3 MG Tabs tablet Take 1 tablet (3 mg total) by mouth at bedtime.   Minerin Creme Crea Apply 1 application topically as needed (dry skin). What changed: how to take this   mirtazapine 15 MG tablet Commonly known as: REMERON Take 1 tablet (15 mg total) by mouth at bedtime.   neomycin-bacitracin-polymyxin [phone] ointment Apply 1 application topically daily as needed (minor skin tears/ abrasions).   PARoxetine 20 MG tablet Commonly known as: PAXIL TAKE 1/2 TABLET BY MOUTH EVERY MORNING AND 1/2 TABLET EACH EVENING What changed:  how much to take how to take this when to take this additional instructions   Probiotic Blend Caps Take 1 capsule by mouth daily.   propranolol 60 MG tablet Commonly known as: INDERAL Take 2 tablets (120 mg total) by mouth 2 (two) times daily. TAKE 2 TABLETS (120 MG TOTAL) BY MOUTH 2 (TWO) TIMES DAILY. What changed: See the new instructions.   QUEtiapine 25 MG tablet Commonly known as: SEROQUEL Take 1 tablet (25 mg total) by mouth at bedtime.         Follow-up Information     Copland, Gay Filler, MD. Schedule an appointment as soon as possible for a visit in 1 week(s).   Specialty: Family Medicine Contact information: [address] [place]  [address] [phone]                 Major procedures and Radiology Reports - PLEASE review detailed and final reports thoroughly  -       CT ABDOMEN PELVIS WO CONTRAST  Result Date: 02/06/2021 CLINICAL DATA:  87 year old with fall in the shower yesterday due to weakness. Diarrhea. Diverticulitis suspected. EXAM: CT ABDOMEN AND PELVIS WITHOUT CONTRAST TECHNIQUE: Multidetector CT imaging of the abdomen and pelvis was performed following the standard protocol without IV contrast. COMPARISON:  None. FINDINGS: Lower chest: Small left pleural effusion with adjacent linear atelectasis in the left lower lobe. Trace right pleural effusion. Small pericardial effusion. Normal heart size. Hepatobiliary: No evidence of focal hepatic abnormality on this noncontrast exam. Cholecystectomy. Expected biliary prominence postcholecystectomy. No visualized choledocholithiasis. No evidence of hepatic injury on this noncontrast exam. Pancreas: No ductal dilatation or inflammation. Spleen: Normal in size. No focal abnormality or perisplenic hematoma. Adrenals/Urinary Tract: Adrenal thickening without dominant nodule or adrenal hemorrhage. Mild prominence of the left renal pelvis with perinephric edema. No visualized urolithiasis. Subcentimeter hyperdense focus in the lower left kidney is likely hyperdense cyst. 18 mm cyst in the upper left kidney. Minimal right perinephric edema without right hydronephrosis or renal calculi. No focal right renal abnormality. Partially distended urinary bladder. No bladder wall thickening. Stomach/Bowel: Tiny hiatal hernia. The stomach is otherwise unremarkable. There is no small bowel obstruction or inflammation. Minimal distal small bowel diverticulosis. Multifocal colonic diverticulosis, prominent involving the sigmoid. No evidence of diverticulitis or acute colonic inflammation. There is liquid stool in the ascending and transverse colon. Appendix not definitively  seen.  Vascular/Lymphatic: Aortic atherosclerosis without aneurysm. No retroperitoneal fluid or edema. No abdominopelvic adenopathy. Reproductive: Small uterine fibroids some of which are calcified. No adnexal mass. Other: No free air or ascites. There is a small fat containing umbilical hernia without inflammation. Musculoskeletal: Advanced degenerative disc disease at L3-L4 with additional degenerative disc disease at multiple levels. Bilateral hip osteoarthritis. There are no acute or suspicious osseous abnormalities. IMPRESSION: 1. Colonic diverticulosis without diverticulitis. Liquid stool in the ascending and transverse colon, can be seen with diarrheal illness. 2. Mild prominence of the left renal collecting system with left perinephric edema, recommend correlation with urinalysis to exclude urinary tract infection. No urolithiasis. 3. Small left and trace right pleural effusions. Small pericardial effusion. 4. Small uterine fibroids. Aortic Atherosclerosis (ICD10-I70.0). Electronically Signed   By: Keith Rake M.D.   On: 02/06/2021 17:17   CT Head Wo Contrast  Result Date: 02/06/2021 CLINICAL DATA:  Fall.  Head injury EXAM: CT HEAD WITHOUT CONTRAST TECHNIQUE: Contiguous axial images were obtained from the base of the skull through the vertex without intravenous contrast. COMPARISON:  CT head 06/27/2008 FINDINGS: Brain: Moderate atrophy with progression. Negative for acute infarct, hemorrhage, mass. Vascular: Negative for hyperdense vessel Skull: Negative for skull fracture Sinuses/Orbits: Extensive mucosal edema in the paranasal sinuses. Bony thickening and contraction of left maxillary sinus. Negative orbit Other: None IMPRESSION: Moderate atrophy.  No acute intracranial abnormality. Electronically Signed   By: Franchot Gallo M.D.   On: 02/06/2021 17:14   DG Chest Port 1 View  Result Date: 02/09/2021 CLINICAL DATA:  Shortness of breath. EXAM: PORTABLE CHEST 1 VIEW COMPARISON:  Chest radiograph  09/15/2009. Lung bases from abdominal CT 02/06/2021 FINDINGS: Retrocardiac opacity corresponds to small left pleural effusion and basilar opacity on abdominal CT. This is not significantly changed allowing for differences in modality. There may be a trace right pleural effusion. Upper normal heart size with normal mediastinal contours and aortic atherosclerosis. No pulmonary edema or pneumothorax. IMPRESSION: Retrocardiac opacity corresponds to small left pleural effusion and basilar opacity on recent abdominal CT. This is not significantly changed allowing for differences in modality. Electronically Signed   By: Keith Rake M.D.   On: 02/09/2021 16:33    Micro Results     Recent Results (from the past 240 hour(s))  Urine culture     Status: Abnormal   Collection Time: 02/06/21  1:31 PM   Specimen: Urine, Random  Result Value Ref Range Status   Specimen Description URINE, RANDOM  Final   Special Requests   Final    NONE Performed at [facility], [address] [place], [place] [address]    Culture >=100,000 COLONIES/mL ESCHERICHIA COLI (A)  Final   Report Status 02/09/2021 FINAL  Final   Organism ID, Bacteria ESCHERICHIA COLI (A)  Final      Susceptibility   Escherichia coli - MIC*    AMPICILLIN 8 SENSITIVE Sensitive     CEFAZOLIN <=4 SENSITIVE Sensitive     CEFEPIME <=0.12 SENSITIVE Sensitive     CEFTRIAXONE <=0.25 SENSITIVE Sensitive     CIPROFLOXACIN <=0.25 SENSITIVE Sensitive     GENTAMICIN <=1 SENSITIVE Sensitive     IMIPENEM <=0.25 SENSITIVE Sensitive     NITROFURANTOIN <=16 SENSITIVE Sensitive     TRIMETH/SULFA <=20 SENSITIVE Sensitive     AMPICILLIN/SULBACTAM 4 SENSITIVE Sensitive     PIP/TAZO <=4 SENSITIVE Sensitive     * >=100,000 COLONIES/mL ESCHERICHIA COLI  Resp Panel by RT-PCR (Flu A&B, Covid) Nasopharyngeal Swab  Status: None   Collection Time: 02/06/21  7:59 PM   Specimen: Nasopharyngeal Swab; Nasopharyngeal(NP) swabs in vial transport medium  Result  Value Ref Range Status   SARS Coronavirus 2 by RT PCR NEGATIVE NEGATIVE Final    Comment: (NOTE) SARS-CoV-2 target nucleic acids are NOT DETECTED.  The SARS-CoV-2 RNA is generally detectable in upper respiratory specimens during the acute phase of infection. The lowest concentration of SARS-CoV-2 viral copies this assay can detect is 138 copies/mL. A negative result does not preclude SARS-Cov-2 infection and should not be used as the sole basis for treatment or other patient management decisions. A negative result may occur with  improper specimen collection/handling, submission of specimen other than nasopharyngeal swab, presence of viral mutation(s) within the areas targeted by this assay, and inadequate number of viral copies(<138 copies/mL). A negative result must be combined with clinical observations, patient history, and epidemiological information. The expected result is Negative.  Fact Sheet for Patients:  EntrepreneurPulse.com.au  Fact Sheet for Healthcare Providers:  IncredibleEmployment.be  This test is no t yet approved or cleared by the [place] FDA and  has been authorized for detection and/or diagnosis of SARS-CoV-2 by FDA under an Emergency Use Authorization (EUA). This EUA will remain  in effect (meaning this test can be used) for the duration of the COVID-19 declaration under Section 564(b)(1) of the Act, 21 U.S.C.section 360bbb-3(b)(1), unless the authorization is terminated  or revoked sooner.       Influenza A by PCR NEGATIVE NEGATIVE Final   Influenza B by PCR NEGATIVE NEGATIVE Final    Comment: (NOTE) The Xpert Xpress SARS-CoV-2/FLU/RSV plus assay is intended as an aid in the diagnosis of influenza from Nasopharyngeal swab specimens and should not be used as a sole basis for treatment. Nasal washings and aspirates are unacceptable for Xpert Xpress SARS-CoV-2/FLU/RSV testing.  Fact Sheet for  Patients: EntrepreneurPulse.com.au  Fact Sheet for Healthcare Providers: IncredibleEmployment.be  This test is not yet approved or cleared by the [place] FDA and has been authorized for detection and/or diagnosis of SARS-CoV-2 by FDA under an Emergency Use Authorization (EUA). This EUA will remain in effect (meaning this test can be used) for the duration of the COVID-19 declaration under Section 564(b)(1) of the Act, 21 U.S.C. section 360bbb-3(b)(1), unless the authorization is terminated or revoked.  Performed at [facility], [place] [address] [place], [place] [address]   Culture, blood (routine x 2)     Status: None (Preliminary result)   Collection Time: 02/09/21  4:07 PM   Specimen: BLOOD  Result Value Ref Range Status   Specimen Description BLOOD LEFT HAND  Final   Special Requests   Final    BOTTLES DRAWN AEROBIC AND ANAEROBIC Blood Culture results may not be optimal due to an inadequate volume of blood received in culture bottles   Culture   Final    NO GROWTH 2 DAYS Performed at [facility], [place] [address], [place],  [address]    Report Status PENDING  Incomplete  Culture, blood (routine x 2)     Status: None (Preliminary result)   Collection Time: 02/09/21  4:07 PM   Specimen: BLOOD  Result Value Ref Range Status   Specimen Description BLOOD RIGHT HAND  Final   Special Requests   Final    BOTTLES DRAWN AEROBIC AND ANAEROBIC Blood Culture results may not be optimal due to an inadequate volume of blood received in culture bottles   Culture  Final    NO GROWTH 2 DAYS Performed at [facility], [place] [address] [place], [place] [address]    Report Status PENDING  Incomplete    Today   Subjective    Suzanne Monroe today has no headache,no chest abdominal pain,no new weakness tingling or numbness, feels much better wants to go home today.   Objective   Blood pressure (!) 169/77, pulse (!) 104,  temperature 99.2 F (37.3 C), resp. rate 19, height 5\' 3"  (1.6 m), weight 55 kg, SpO2 98 %.   Intake/Output Summary (Last 24 hours) at 02/11/2021 1321 Last data filed at 02/11/2021 1100 Gross per 24 hour  Intake 100 ml  Output 500 ml  Net -400 ml    Exam  Awake but pleasantly confused, no focal deficits [place].AT,PERRAL Supple Neck,No JVD, No cervical lymphadenopathy appriciated.  Symmetrical Chest wall movement, Good air movement bilaterally, CTAB RRR,No Gallops,Rubs or new Murmurs, No Parasternal Heave +ve B.Sounds, Abd Soft, Non tender, No organomegaly appriciated, No rebound -guarding or rigidity. No Cyanosis, Clubbing or edema, No new Rash or bruise   Data Review   CBC w Diff:  Lab Results  Component Value Date   WBC 11.5 (H) 02/10/2021   HGB 12.6 02/10/2021   HCT 37.2 02/10/2021   PLT 243 02/10/2021   LYMPHOPCT 22 02/10/2021   MONOPCT 9 02/10/2021   EOSPCT 2 02/10/2021   BASOPCT 1 02/10/2021    CMP:  Lab Results  Component Value Date   NA 140 02/10/2021   K 3.7 02/10/2021   CL 111 02/10/2021   CO2 21 (L) 02/10/2021   BUN 11 02/10/2021   CREATININE 0.78 02/10/2021   PROT 5.4 (L) 02/10/2021   ALBUMIN 2.2 (L) 02/10/2021   BILITOT 0.1 (L) 02/10/2021   ALKPHOS 117 02/10/2021   AST 29 02/10/2021   ALT 29 02/10/2021  .   Total Time in preparing paper work, data evaluation and todays exam - 64 minutes  Lala Lund M.D on 02/11/2021 at 1:21 PM  [facility]

## 2021-02-11 NOTE — TOC Transition Note (Signed)
Transition of Care [facility]) - CM/SW Discharge Note   Patient Details  Name: SYDELLE SHERFIELD MRN: [id number] Date of Birth: 1932-11-24  Transition of Care [facility]) CM/SW Contact:  Milinda Antis, [place] Phone Number: 02/11/2021, 10:22 AM   Clinical Narrative:     Patient will DC to: [facility] Anticipated DC date: 02/11/2021 Family notified: Yes Transport by: Corey Harold   Per MD patient ready for DC to [facility]. RN to call report prior to discharge [phone]. RN, patient, patient's family, and facility notified of DC. Discharge Summary and FL2 sent to facility. DC packet on chart. Ambulance transport requested for patient.   CSW spoke with Pamala Hurry at [facility] and confirmed that the patient can return to the facility today.  The patient will need a  COVID test and hard copies of all prescriptions.    CSW faxed the d/c summary and FL2 to the facility at [phone].  CSW will sign off for now as social work intervention is no longer needed. Please consult [place] again if new needs arise.    Final next level of care: Assisted Living Barriers to Discharge: Barriers Resolved   Patient Goals and CMS Choice        Discharge Placement                Patient to be transferred to facility by: [facility] Name of family member notified: Theone Stanley (Daughter)   [phone] Patient and family notified of of transfer: 02/11/21  Discharge Plan and Services                          HH Arranged: PT, OT, Nurse's Aide [place] Agency: Other - See comment (In house home health for [employer])     Representative spoke with at [place]: [place] ([place]) Interventions     Readmission Risk Interventions No flowsheet data found.

## 2021-02-11 NOTE — NC FL2 (Addendum)
[place] MEDICAID FL2 LEVEL OF CARE SCREENING TOOL     IDENTIFICATION  Patient Name: Suzanne Monroe Birthdate: 10/01/1932 Sex: female Admission Date (Current Location): 02/06/2021  [facility] and [place] Number:  Herbalist and Address:  [facility], [place] [address], [place], [place] [address]      Provider Number: [id number]  Attending Physician Name and Address:  Thurnell Lose, MD  Relative Name and Phone Number:       Current Level of Care: Hospital Recommended Level of Care: [place] Prior Approval Number:    Date Approved/Denied:   PASRR Number:    Discharge Plan: Other (Comment) (ALF/ [facility])    Current Diagnoses: Patient Active Problem List   Diagnosis Date Noted   UTI (urinary tract infection) 02/06/2021   Leukocytosis 02/06/2021   AKI (acute kidney injury) ([place]) 02/06/2021   Essential hypertension 10/28/2014   Hyperlipidemia 10/28/2014   Central retinal vein occlusion 10/28/2014   Irregular heartbeat 10/28/2014   Fibromyalgia 05/15/2013   Low TSH level 05/07/2013   Memory loss 05/07/2013   Hypokalemia 05/07/2013   Frequent falls 05/07/2013   Anxiety and depression 10/30/2007   DYSPEPSIA 10/30/2007   HYPERCHOLESTEROLEMIA, PURE 03/30/2007   ICHTHYOSIS 03/29/2007   MIGRAINE, CHRONIC 11/03/2006   Irritable bowel syndrome 11/03/2006   OSTEOARTHRITIS 11/03/2006   POLYMYALGIA RHEUMATICA 11/03/2006   PERIPHERAL NEUROPATHY 12/21/2000   COLITIS, ULCERATIVE NOS 08/23/1993    Orientation RESPIRATION BLADDER Height & Weight     Self, Situation, Place (forgetful)  Normal Continent Weight: 121 lb 4.1 oz (55 kg) Height:  5\' 3"  (160 cm)  BEHAVIORAL SYMPTOMS/MOOD NEUROLOGICAL BOWEL NUTRITION STATUS      Continent Diet (soft regular diet)  AMBULATORY STATUS COMMUNICATION OF NEEDS Skin   Extensive Assist Verbally Normal                       Personal Care Assistance Level of Assistance  Bathing,  Feeding, Dressing Bathing Assistance: Limited assistance Feeding assistance: Independent Dressing Assistance: Limited assistance     Functional Limitations Info  Sight, Hearing, Speech Sight Info: Adequate Hearing Info: Adequate Speech Info: Adequate    SPECIAL CARE FACTORS FREQUENCY                       Contractures Contractures Info: Not present    Additional Factors Info  Code Status, Allergies Code Status Info: Full Code Allergies Info: Ampicillin, Cefaclor, Codeine, Duloxetine, Fexofenadine, Ibuprofen, Loratadine, Nsaids           Current Medications (02/11/2021):      Discharge Medications: STOP taking these medications     diphenoxylate-atropine 2.5-0.025 MG tablet Commonly known as: LOMOTIL           TAKE these medications     acetaminophen 500 MG tablet Commonly known as: TYLENOL Take 1 tablet (500 mg total) by mouth every 6 (six) hours as needed for moderate pain. What changed: Another medication with the same name was removed. Continue taking this medication, and follow the directions you see here.    alum & mag hydroxide-simeth 200-200-20 MG/5ML suspension Commonly known as: MAALOX/MYLANTA Take 30 mLs by mouth every 6 (six) hours as needed for indigestion (heartburn).    amLODipine 10 MG tablet Commonly known as: NORVASC Take 1 tablet (10 mg total) by mouth daily. What changed: medication strength how much to take    Calcium Carbonate-Vitamin D 600-400 MG-UNIT tablet Take 1  tablet by mouth daily.    cephALEXin 500 MG capsule Commonly known as: KEFLEX Take 1 capsule (500 mg total) by mouth 3 (three) times daily for 5 days.    donepezil 5 MG tablet Commonly known as: ARICEPT Take 1 tablet (5 mg total) by mouth at bedtime.    Ensure Original Liqd Take 237 mLs by mouth 3 (three) times daily.    furosemide 20 MG tablet Commonly known as: LASIX Take 1 tablet (20 mg total) by mouth every other day.    guaifenesin 100 MG/5ML  syrup Commonly known as: ROBITUSSIN Take 10 mLs (200 mg total) by mouth every 6 (six) hours as needed for cough.    lisinopril 20 MG tablet Commonly known as: ZESTRIL Take 1 tablet (20 mg total) by mouth daily. What changed: medication strength how much to take    loperamide 2 MG tablet Commonly known as: IMODIUM A-D Take 2 tablets (4 mg total) by mouth in the morning and at bedtime. What changed: Another medication with the same name was removed. Continue taking this medication, and follow the directions you see here.    magnesium hydroxide 400 MG/5ML suspension Commonly known as: MILK OF MAGNESIA Take 30 mLs by mouth at bedtime as needed for mild constipation.    melatonin 3 MG Tabs tablet Take 1 tablet (3 mg total) by mouth at bedtime.    Minerin Creme Crea Apply 1 application topically as needed (dry skin). What changed: how to take this    mirtazapine 15 MG tablet Commonly known as: REMERON Take 1 tablet (15 mg total) by mouth at bedtime.    neomycin-bacitracin-polymyxin [phone] ointment Apply 1 application topically daily as needed (minor skin tears/ abrasions).    PARoxetine 20 MG tablet Commonly known as: PAXIL TAKE 1/2 TABLET BY MOUTH EVERY MORNING AND 1/2 TABLET EACH EVENING What changed: how much to take how to take this when to take this additional instructions    Probiotic Blend Caps Take 1 capsule by mouth daily.    propranolol 60 MG tablet Commonly known as: INDERAL Take 2 tablets (120 mg total) by mouth 2 (two) times daily. TAKE 2 TABLETS (120 MG TOTAL) BY MOUTH 2 (TWO) TIMES DAILY. What changed: See the new instructions.    QUEtiapine 25 MG tablet Commonly known as: SEROQUEL Take 1 tablet (25 mg total) by mouth at bedtime.     Relevant Imaging Results:  Relevant Lab Results:   Additional Information SS# [id number]  Paulene Floor Kahiau Schewe, LCSWA

## 2021-02-12 ENCOUNTER — Telehealth: Payer: Self-pay

## 2021-02-12 NOTE — Telephone Encounter (Signed)
Transition Care Management Unsuccessful Follow-up Telephone Call  Date of discharge and from where:  02/11/2021-[facility]  Attempts:  1st Attempt  Reason for unsuccessful TCM follow-up call:  No answer/busy

## 2021-02-13 NOTE — Telephone Encounter (Signed)
Transition Care Management Unsuccessful Follow-up Telephone Call  Date of discharge and from where:  02/11/21-[facility]  Attempts:  3rd Attempt  Reason for unsuccessful TCM follow-up call:  No answer/busy

## 2021-02-14 LAB — CULTURE, BLOOD (ROUTINE X 2)
Culture: NO GROWTH
Culture: NO GROWTH

## 2022-03-11 ENCOUNTER — Emergency Department (HOSPITAL_COMMUNITY): Payer: Medicare Other

## 2022-03-11 ENCOUNTER — Encounter (HOSPITAL_COMMUNITY): Payer: Self-pay | Admitting: Emergency Medicine

## 2022-03-11 ENCOUNTER — Inpatient Hospital Stay (HOSPITAL_COMMUNITY)
Admission: EM | Admit: 2022-03-11 | Discharge: 2022-03-18 | DRG: 521 | Disposition: A | Discharge status: Skilled Nursing Facility | Payer: Medicare Other | Source: Skilled Nursing Facility | Attending: Internal Medicine | Admitting: Internal Medicine

## 2022-03-11 ENCOUNTER — Other Ambulatory Visit: Payer: Self-pay

## 2022-03-11 DIAGNOSIS — E440 Moderate protein-calorie malnutrition: Secondary | ICD-10-CM | POA: Diagnosis present

## 2022-03-11 DIAGNOSIS — Z87891 Personal history of nicotine dependence: Secondary | ICD-10-CM | POA: Diagnosis not present

## 2022-03-11 DIAGNOSIS — Z96612 Presence of left artificial shoulder joint: Secondary | ICD-10-CM | POA: Diagnosis present

## 2022-03-11 DIAGNOSIS — R001 Bradycardia, unspecified: Secondary | ICD-10-CM | POA: Diagnosis present

## 2022-03-11 DIAGNOSIS — S72011A Unspecified intracapsular fracture of right femur, initial encounter for closed fracture: Principal | ICD-10-CM | POA: Diagnosis present

## 2022-03-11 DIAGNOSIS — I10 Essential (primary) hypertension: Secondary | ICD-10-CM | POA: Diagnosis present

## 2022-03-11 DIAGNOSIS — F05 Delirium due to known physiological condition: Secondary | ICD-10-CM | POA: Diagnosis not present

## 2022-03-11 DIAGNOSIS — G9341 Metabolic encephalopathy: Secondary | ICD-10-CM | POA: Diagnosis present

## 2022-03-11 DIAGNOSIS — Z79899 Other long term (current) drug therapy: Secondary | ICD-10-CM

## 2022-03-11 DIAGNOSIS — Y92121 Bathroom in nursing home as the place of occurrence of the external cause: Secondary | ICD-10-CM

## 2022-03-11 DIAGNOSIS — F32A Depression, unspecified: Secondary | ICD-10-CM | POA: Diagnosis present

## 2022-03-11 DIAGNOSIS — Z8249 Family history of ischemic heart disease and other diseases of the circulatory system: Secondary | ICD-10-CM | POA: Diagnosis not present

## 2022-03-11 DIAGNOSIS — W19XXXA Unspecified fall, initial encounter: Secondary | ICD-10-CM | POA: Diagnosis present

## 2022-03-11 DIAGNOSIS — Z886 Allergy status to analgesic agent status: Secondary | ICD-10-CM

## 2022-03-11 DIAGNOSIS — F0390 Unspecified dementia without behavioral disturbance: Secondary | ICD-10-CM | POA: Diagnosis present

## 2022-03-11 DIAGNOSIS — G629 Polyneuropathy, unspecified: Secondary | ICD-10-CM | POA: Diagnosis present

## 2022-03-11 DIAGNOSIS — H3530 Unspecified macular degeneration: Secondary | ICD-10-CM | POA: Diagnosis present

## 2022-03-11 DIAGNOSIS — G9340 Encephalopathy, unspecified: Secondary | ICD-10-CM | POA: Diagnosis present

## 2022-03-11 DIAGNOSIS — Z880 Allergy status to penicillin: Secondary | ICD-10-CM

## 2022-03-11 DIAGNOSIS — F418 Other specified anxiety disorders: Secondary | ICD-10-CM | POA: Diagnosis not present

## 2022-03-11 DIAGNOSIS — Z888 Allergy status to other drugs, medicaments and biological substances status: Secondary | ICD-10-CM

## 2022-03-11 DIAGNOSIS — Z885 Allergy status to narcotic agent status: Secondary | ICD-10-CM

## 2022-03-11 DIAGNOSIS — S72001A Fracture of unspecified part of neck of right femur, initial encounter for closed fracture: Secondary | ICD-10-CM | POA: Diagnosis not present

## 2022-03-11 DIAGNOSIS — Z6823 Body mass index (BMI) 23.0-23.9, adult: Secondary | ICD-10-CM

## 2022-03-11 LAB — BASIC METABOLIC PANEL
Anion gap: 8 (ref 5–15)
BUN: 16 mg/dL (ref 8–23)
CO2: 23 mmol/L (ref 22–32)
Calcium: 9 mg/dL (ref 8.9–10.3)
Chloride: 111 mmol/L (ref 98–111)
Creatinine, Ser: 0.99 mg/dL (ref 0.44–1.00)
GFR, Estimated: 55 mL/min — ABNORMAL LOW (ref >60)
Glucose, Bld: 125 mg/dL — ABNORMAL HIGH (ref 70–99)
Potassium: 3.7 mmol/L (ref 3.5–5.1)
Sodium: 142 mmol/L (ref 135–145)

## 2022-03-11 LAB — CBC WITH DIFFERENTIAL/PLATELET
Abs Immature Granulocytes: 0.14 10*3/uL — ABNORMAL HIGH (ref 0.00–0.07)
Basophils Absolute: 0.1 10*3/uL (ref 0.0–0.1)
Basophils Relative: 1 %
Eosinophils Absolute: 0.1 10*3/uL (ref 0.0–0.5)
Eosinophils Relative: 1 %
HCT: 40.6 % (ref 36.0–46.0)
Hemoglobin: 13.9 g/dL (ref 12.0–15.0)
Immature Granulocytes: 1 %
Lymphocytes Relative: 15 %
Lymphs Abs: 1.9 10*3/uL (ref 0.7–4.0)
MCH: 31.4 pg (ref 26.0–34.0)
MCHC: 34.2 g/dL (ref 30.0–36.0)
MCV: 91.9 fL (ref 80.0–100.0)
Monocytes Absolute: 0.8 10*3/uL (ref 0.1–1.0)
Monocytes Relative: 6 %
Neutro Abs: 9.8 10*3/uL — ABNORMAL HIGH (ref 1.7–7.7)
Neutrophils Relative %: 76 %
Platelets: 192 10*3/uL (ref 150–400)
RBC: 4.42 MIL/uL (ref 3.87–5.11)
RDW: 13 % (ref 11.5–15.5)
WBC: 12.8 10*3/uL — ABNORMAL HIGH (ref 4.0–10.5)
nRBC: 0 % (ref 0.0–0.2)

## 2022-03-11 LAB — PROTIME-INR
INR: 1 (ref 0.8–1.2)
Prothrombin Time: 13.3 seconds (ref 11.4–15.2)

## 2022-03-11 MED ORDER — MAGNESIUM HYDROXIDE 400 MG/5ML PO SUSP: 30.0000 mL | Freq: Every evening | ORAL | Status: DC | PRN

## 2022-03-11 MED ORDER — DONEPEZIL HCL 5 MG PO TABS
5.0000 mg | ORAL_TABLET | Freq: Every day | ORAL | Status: DC
  Administered 2022-03-12 – 2022-03-17 (×6): 5 mg via ORAL
  Filled 2022-03-11 (×6): qty 1

## 2022-03-11 MED ORDER — FENTANYL CITRATE PF 50 MCG/ML IJ SOSY
50.0000 ug | PREFILLED_SYRINGE | INTRAMUSCULAR | Status: DC | PRN
  Administered 2022-03-11: 50 ug via INTRAVENOUS
  Filled 2022-03-11: qty 1

## 2022-03-11 MED ORDER — CEFAZOLIN SODIUM-DEXTROSE 2-4 GM/100ML-% IV SOLN
2.0000 g | INTRAVENOUS | Status: AC
  Administered 2022-03-12: 2 g via INTRAVENOUS
  Filled 2022-03-11 (×2): qty 100

## 2022-03-11 MED ORDER — PAROXETINE HCL 20 MG PO TABS
20.0000 mg | ORAL_TABLET | Freq: Every day | ORAL | Status: DC
  Administered 2022-03-12 – 2022-03-17 (×6): 20 mg via ORAL
  Filled 2022-03-11 (×6): qty 1

## 2022-03-11 MED ORDER — ONDANSETRON HCL 4 MG/2ML IJ SOLN
4.0000 mg | Freq: Once | INTRAMUSCULAR | Status: AC
  Administered 2022-03-11: 4 mg via INTRAVENOUS
  Filled 2022-03-11: qty 2

## 2022-03-11 MED ORDER — ACETAMINOPHEN 500 MG PO TABS
1000.0000 mg | ORAL_TABLET | Freq: Once | ORAL | Status: AC
  Administered 2022-03-12: 1000 mg via ORAL
  Filled 2022-03-11 (×2): qty 2

## 2022-03-11 MED ORDER — CALCIUM CARBONATE-VITAMIN D 600-400 MG-UNIT PO TABS: 1.0000 | ORAL_TABLET | Freq: Every day | ORAL | Status: DC

## 2022-03-11 MED ORDER — SODIUM CHLORIDE 0.9 % IV SOLN: INTRAVENOUS | Status: DC

## 2022-03-11 MED ORDER — MORPHINE SULFATE (PF) 2 MG/ML IV SOLN: 0.5000 mg | INTRAVENOUS | Status: DC | PRN

## 2022-03-11 MED ORDER — PROPRANOLOL HCL 20 MG PO TABS
120.0000 mg | ORAL_TABLET | Freq: Two times a day (BID) | ORAL | Status: DC
  Filled 2022-03-11 (×2): qty 6

## 2022-03-11 MED ORDER — HALOPERIDOL 5 MG PO TABS: 5.0000 mg | ORAL_TABLET | Freq: Every day | ORAL | Status: DC | PRN

## 2022-03-11 MED ORDER — QUETIAPINE FUMARATE 25 MG PO TABS
25.0000 mg | ORAL_TABLET | Freq: Every day | ORAL | Status: DC
  Administered 2022-03-12 – 2022-03-17 (×6): 25 mg via ORAL
  Filled 2022-03-11 (×6): qty 1

## 2022-03-11 MED ORDER — FENTANYL CITRATE PF 50 MCG/ML IJ SOSY
25.0000 ug | PREFILLED_SYRINGE | INTRAMUSCULAR | Status: DC | PRN
  Administered 2022-03-12: 50 ug via INTRAVENOUS
  Administered 2022-03-12 (×4): 25 ug via INTRAVENOUS
  Filled 2022-03-11 (×5): qty 1

## 2022-03-11 MED ORDER — OYSTER SHELL CALCIUM/D3 500-5 MG-MCG PO TABS
1.0000 | ORAL_TABLET | Freq: Every day | ORAL | Status: DC
  Administered 2022-03-13 – 2022-03-18 (×6): 1 via ORAL
  Filled 2022-03-11 (×8): qty 1

## 2022-03-11 MED ORDER — MELATONIN 3 MG PO TABS
3.0000 mg | ORAL_TABLET | Freq: Every day | ORAL | Status: DC
  Administered 2022-03-12 – 2022-03-17 (×6): 3 mg via ORAL
  Filled 2022-03-11 (×6): qty 1

## 2022-03-11 MED ORDER — DIVALPROEX SODIUM 125 MG PO CSDR
125.0000 mg | DELAYED_RELEASE_CAPSULE | Freq: Three times a day (TID) | ORAL | Status: DC
  Administered 2022-03-12 – 2022-03-17 (×17): 125 mg via ORAL
  Filled 2022-03-11 (×17): qty 1

## 2022-03-11 MED ORDER — HALOPERIDOL LACTATE 5 MG/ML IJ SOLN: 2.0000 mg | Freq: Four times a day (QID) | INTRAMUSCULAR | Status: DC | PRN

## 2022-03-11 MED ORDER — LISINOPRIL 20 MG PO TABS
40.0000 mg | ORAL_TABLET | Freq: Every day | ORAL | Status: DC
  Administered 2022-03-12 – 2022-03-17 (×6): 40 mg via ORAL
  Filled 2022-03-11 (×6): qty 2

## 2022-03-11 MED ORDER — HYDROCODONE-ACETAMINOPHEN 5-325 MG PO TABS: 1.0000 | ORAL_TABLET | Freq: Four times a day (QID) | ORAL | Status: DC | PRN

## 2022-03-11 MED ORDER — AMLODIPINE BESYLATE 10 MG PO TABS
10.0000 mg | ORAL_TABLET | Freq: Every day | ORAL | Status: DC
  Administered 2022-03-12 – 2022-03-17 (×6): 10 mg via ORAL
  Filled 2022-03-11 (×6): qty 1

## 2022-03-11 MED ORDER — TRANEXAMIC ACID-NACL 1000-0.7 MG/100ML-% IV SOLN
1000.0000 mg | INTRAVENOUS | Status: AC
  Administered 2022-03-12: 1000 mg via INTRAVENOUS
  Filled 2022-03-11 (×2): qty 100

## 2022-03-11 NOTE — ED Triage Notes (Signed)
Pt arriving from [facility] following an unwitnessed fall in the bathroom. Pt states she did not hit her head. Has right hip pain with rotation. Pt was given 45mg Fentanyl prior to arrival, currently rating pain 1/10. Pt not on blood thinners.

## 2022-03-11 NOTE — Assessment & Plan Note (Signed)
Cont home dementia meds.

## 2022-03-11 NOTE — ED Notes (Signed)
Pt removed both ivs and vital signs equipment. Pt is confused per baseline.

## 2022-03-11 NOTE — ED Provider Notes (Signed)
[facility] DEPT Provider Note   CSN: [id number] Arrival date & time: 03/11/22  1714     History  Chief Complaint  Patient presents with   Suzanne Monroe is a 88 y.o. female.  Patient is an 88 year old female with a history of hypertension, anxiety, depression, eczema who lives at [facility] and is presenting today by EMS after an unwitnessed fall in the bathroom.  When she was found she was complaining of pain in her right hip and was unable to stand or ambulate.  She denies hitting her head or loss of consciousness.  She denies taking any anticoagulation.  Upon questioning patient has received fentanyl and cannot clearly report why she fell today.  She denies any pain in her left leg, arms, chest or abdomen.  She denies any nausea vomiting, shortness of breath or diarrhea.  The history is provided by the patient and the EMS personnel.  Fall       Home Medications Prior to Admission medications   Medication Sig Start Date End Date Taking? Authorizing Provider  acetaminophen (TYLENOL) 500 MG tablet Take 1 tablet (500 mg total) by mouth every 6 (six) hours as needed for moderate pain. 02/11/21  Yes Thurnell Lose, MD  amLODipine (NORVASC) 10 MG tablet Take 1 tablet (10 mg total) by mouth daily. 02/11/21  Yes Thurnell Lose, MD  Calcium Carbonate-Vitamin D 600-400 MG-UNIT tablet Take 1 tablet by mouth daily. 02/11/21  Yes Thurnell Lose, MD  divalproex (DEPAKOTE SPRINKLE) 125 MG capsule Take 125 mg by mouth 3 (three) times daily.   Yes [provider]  donepezil (ARICEPT) 5 MG tablet Take 1 tablet (5 mg total) by mouth at bedtime. 02/11/21  Yes Thurnell Lose, MD  Emollient (AQUAPHOR ADV PROTECT HEALING) 41 % OINT Apply 1 Application topically daily.   Yes [provider]  furosemide (LASIX) 20 MG tablet Take 1 tablet (20 mg total) by mouth every other day. 02/11/21  Yes Thurnell Lose, MD  guaifenesin (ROBITUSSIN)  100 MG/5ML syrup Take 10 mLs (200 mg total) by mouth every 6 (six) hours as needed for cough. 02/11/21  Yes Thurnell Lose, MD  haloperidol (HALDOL) 5 MG tablet Take 5 mg by mouth daily as needed for agitation.   Yes [provider]  lisinopril (ZESTRIL) 40 MG tablet Take 40 mg by mouth daily.   Yes [provider]  loperamide (IMODIUM A-D) 2 MG tablet Take 2 tablets (4 mg total) by mouth in the morning and at bedtime. 02/11/21  Yes Thurnell Lose, MD  magnesium hydroxide (MILK OF MAGNESIA) 400 MG/5ML suspension Take 30 mLs by mouth at bedtime as needed for mild constipation. 02/11/21  Yes Thurnell Lose, MD  melatonin 3 MG TABS tablet Take 1 tablet (3 mg total) by mouth at bedtime. 02/11/21  Yes Thurnell Lose, MD  neomycin-bacitracin-polymyxin (NEOSPORIN) [phone] ointment Apply 1 application topically daily as needed (minor skin tears/ abrasions). 02/11/21  Yes Thurnell Lose, MD  Nutritional Supplements (ENSURE ORIGINAL) LIQD Take 237 mLs by mouth 3 (three) times daily. 02/11/21  Yes Thurnell Lose, MD  PARoxetine (PAXIL) 20 MG tablet TAKE 1/2 TABLET BY MOUTH EVERY MORNING AND 1/2 TABLET EACH EVENING Patient taking differently: Take 20 mg by mouth daily. 02/11/21  Yes Thurnell Lose, MD  propranolol (INDERAL) 60 MG tablet Take 2 tablets (120 mg total) by mouth 2 (two) times daily. TAKE 2 TABLETS (120  MG TOTAL) BY MOUTH 2 (TWO) TIMES DAILY. 02/11/21  Yes Thurnell Lose, MD  Psyllium Husk 100 % POWD Take 1 packet by mouth daily as needed (For diarrhea).   Yes [provider]  QUEtiapine (SEROQUEL) 25 MG tablet Take 1 tablet (25 mg total) by mouth at bedtime. 02/11/21  Yes Thurnell Lose, MD  Skin Protectants, Misc. (MINERIN CREME) CREA Apply 1 application topically as needed (dry skin). 02/11/21  Yes Thurnell Lose, MD  zinc oxide 20 % ointment Apply 1 Application topically daily.   Yes [provider]  alum & mag hydroxide-simeth  (MAALOX/MYLANTA) 200-200-20 MG/5ML suspension Take 30 mLs by mouth every 6 (six) hours as needed for indigestion (heartburn). Patient not taking: Reported on 03/11/2022 02/11/21   Thurnell Lose, MD  lisinopril (ZESTRIL) 20 MG tablet Take 1 tablet (20 mg total) by mouth daily. Patient not taking: Reported on 03/11/2022 02/11/21   Thurnell Lose, MD  mirtazapine (REMERON) 15 MG tablet Take 1 tablet (15 mg total) by mouth at bedtime. Patient not taking: Reported on 03/11/2022 02/11/21   Thurnell Lose, MD  Probiotic Product (PROBIOTIC BLEND) CAPS Take 1 capsule by mouth daily. Patient not taking: Reported on 03/11/2022 02/11/21   Thurnell Lose, MD      Allergies    Ampicillin, Cefaclor, Codeine, Duloxetine, Fexofenadine, Ibuprofen, Loratadine, and Nsaids    Review of Systems   Review of Systems  Physical Exam Updated Vital Signs BP 109/84   Pulse (!) 58   Temp 97.9 F (36.6 C) (Oral)   Resp 13   SpO2 93%  Physical Exam Vitals and nursing note reviewed.  Constitutional:      General: She is not in acute distress.    Appearance: She is well-developed.     Comments: Appears uncomfortable  HENT:     Head: Normocephalic and atraumatic.  Eyes:     Pupils: Pupils are equal, round, and reactive to light.  Cardiovascular:     Rate and Rhythm: Normal rate and regular rhythm.     Heart sounds: Normal heart sounds. No murmur heard.    No friction rub.  Pulmonary:     Effort: Pulmonary effort is normal.     Breath sounds: Normal breath sounds. No wheezing or rales.  Abdominal:     General: Bowel sounds are normal. There is no distension.     Palpations: Abdomen is soft.     Tenderness: There is no abdominal tenderness. There is no guarding or rebound.  Musculoskeletal:     Right shoulder: Normal.     Left shoulder: Normal.     Right wrist: Normal.     Left wrist: Normal.     Right hip: Deformity and tenderness present. Decreased range of motion.     Left hip: Normal.      Right knee: Normal.     Left knee: Normal.     Right ankle: Normal.     Left ankle: Normal.     Comments: No edema  Skin:    General: Skin is warm and dry.     Findings: No rash.     Comments: Severely dry skin diffusely  Neurological:     Mental Status: She is alert and oriented to person, place, and time. Mental status is at baseline.     Cranial Nerves: No cranial nerve deficit.     Sensory: No sensory deficit.     Motor: No weakness.  Psychiatric:  Mood and Affect: Mood normal.        Behavior: Behavior normal.     ED Results / Procedures / Treatments   Labs (all labs ordered are listed, but only abnormal results are displayed) Labs Reviewed  BASIC METABOLIC PANEL - Abnormal; Notable for the following components:      Result Value   Glucose, Bld 125 (*)    GFR, Estimated 55 (*)    All other components within normal limits  CBC WITH DIFFERENTIAL/PLATELET - Abnormal; Notable for the following components:   WBC 12.8 (*)    Neutro Abs 9.8 (*)    Abs Immature Granulocytes 0.14 (*)    All other components within normal limits  PROTIME-INR  TYPE AND SCREEN    EKG EKG Interpretation  Date/Time:  Thursday March 11 2022 19:04:44 EDT Ventricular Rate:  58 PR Interval:  202 QRS Duration: 111 QT Interval:  470 QTC Calculation: 462 R Axis:   -43 Text Interpretation: Sinus rhythm Left axis deviation Low voltage, precordial leads Borderline T abnormalities, anterior leads No significant change since last tracing Confirmed by Blanchie Dessert [phone]) on 03/11/2022 7:27:12 PM  Radiology DG Hip Unilat With Pelvis 2-3 Views Right  Result Date: 03/11/2022 CLINICAL DATA:  Fall EXAM: DG HIP (WITH OR WITHOUT PELVIS) 2-3V RIGHT COMPARISON:  No prior hip radiographs, correlation is made with CT abdomen pelvis 02/06/2021 FINDINGS: Impacted, foreshortened fracture of the right femoral neck. No other fracture is seen in the pelvis or left hip degenerative changes in the lumbar spine.  Nonobstructive bowel-gas pattern. IMPRESSION: Right femoral neck fracture. Electronically Signed   By: Merilyn Baba M.D.   On: 03/11/2022 18:27   DG Chest 1 View  Result Date: 03/11/2022 CLINICAL DATA:  Un witnessed fall, right hip pain EXAM: CHEST  1 VIEW COMPARISON:  02/09/2021 FINDINGS: Single frontal view of the chest demonstrates an unremarkable cardiac silhouette. No airspace disease, effusion, or pneumothorax. Left shoulder arthroplasty. No acute fractures. IMPRESSION: 1. No acute intrathoracic process. Electronically Signed   By: Randa Ngo M.D.   On: 03/11/2022 18:26    Procedures Procedures    Medications Ordered in ED Medications  0.9 %  sodium chloride infusion ( Intravenous New Bag/Given 03/11/22 1757)  fentaNYL (SUBLIMAZE) injection 50 mcg (50 mcg Intravenous Given 03/11/22 1759)  ondansetron (ZOFRAN) injection 4 mg (4 mg Intravenous Given 03/11/22 1759)    ED Course/ Medical Decision Making/ A&P                           Medical Decision Making Amount and/or Complexity of Data Reviewed Independent Historian: EMS External Data Reviewed: notes. Labs: ordered. Decision-making details documented in ED Course. Radiology: ordered and independent interpretation performed. Decision-making details documented in ED Course. ECG/medicine tests: ordered and independent interpretation performed. Decision-making details documented in ED Course.  Risk Prescription drug management. Decision regarding hospitalization.   Pt with multiple medical problems and comorbidities and presenting today with a complaint that caries a high risk for morbidity and mortality.  Here today with an unwitnessed fall and persistent pain and deformity of the right lower extremity.  Concern for hip fracture based on exam.  Does not appear to have any other evidence of trauma.  Patient does not take any anticoagulation at this time denies any head injury.  Patient is neurovascularly intact at this time.  Hip  fracture protocol initiated.  Patient did receive 75 mcg of fentanyl prior to arrival and seems  to be pain controlled until moved.  7:38 PM I have independently visualized and interpreted pt's images today.  Right hip film showing a right femoral neck fracture.  No evidence of pelvic fracture.  Chest x-ray without acute findings.  I independently interpreted patient's labs and EKG today and CBC with minimal leukocytosis of 12, stable hemoglobin and platelet count, BMP wnl. Spoke with patient's daughter Suzanne Monroe and the patient discussing with them the findings.  Patient will need to have admission and repair of her broken hip.  Consulted orthopedics and patient will be admitted via the hospitalist service.  Spoke with Dr. Tamera Punt from ortho and he recommends keeping pt NPO after midnight and someone will fix her tomorrow.           Final Clinical Impression(s) / ED Diagnoses Final diagnoses:  Fall, initial encounter  Closed displaced fracture of right femoral neck [facility])    Rx / DC Orders ED Discharge Orders     None         Blanchie Dessert, MD 03/11/22 [phone]

## 2022-03-11 NOTE — Consult Note (Signed)
Reason for Consult:Right hip fracture Referring Physician: ED  Suzanne Monroe is an 88 y.o. female.  HPI: Patient is an 88 year old female with a history of hypertension, anxiety, depression, eczema who lives at [facility] and is presenting today by EMS after an unwitnessed fall in the bathroom.  When she was found she was complaining of pain in her right hip and was unable to stand or ambulate.  She denies hitting her head or loss of consciousness.  She denies taking any anticoagulation. She denies any pain in her left leg, arms, chest or abdomen.  She denies any nausea vomiting, shortness of breath or diarrhea. Patient's daughter does help provide health history.  Past Medical History:  Diagnosis Date   Anxiety    Arthritis    OA   Cerumen impaction    recurrent Lt   Depression    Dry skin    severe dry skin/ichthyosis( Uses tanner understands risk)   Hypertension    Macular degeneration    Migraine    Hx of   Peripheral neuropathy     Past Surgical History:  Procedure Laterality Date   APPENDECTOMY     CHOLECYSTECTOMY     RHINOPLASTY     SHOULDER SURGERY     left shoulder replacement    Family History  Problem Relation Age of Onset   Stroke Mother    Hypertension Mother    Alcohol abuse Father    Heart disease Father    Kidney disease Father        dialysis   Hypertension Brother    Hypertension Daughter    Cancer Maternal Aunt        colon CA   Cancer Maternal Uncle        liver CA    Social History:  reports that she quit smoking about 38 years ago. She has never used smokeless tobacco. She reports that she does not drink alcohol and does not use drugs.  Allergies:  Allergies  Allergen Reactions   Ampicillin     REACTION: reaction not known   Cefaclor     REACTION: diarrhea   Codeine     REACTION: diarrhea   Duloxetine     REACTION: severe diarrhea   Fexofenadine     REACTION: fuzzy   Ibuprofen     REACTION: GI   Loratadine     REACTION:  blurred vision   Nsaids     REACTION: fatigue    Medications: I have reviewed the patient's current medications.  Results for orders placed or performed during the hospital encounter of 03/11/22 (from the past 48 hour(s))  Basic metabolic panel     Status: Abnormal   Collection Time: 03/11/22  5:55 PM  Result Value Ref Range   Sodium 142 135 - 145 mmol/L   Potassium 3.7 3.5 - 5.1 mmol/L   Chloride 111 98 - 111 mmol/L   CO2 23 22 - 32 mmol/L   Glucose, Bld 125 (H) 70 - 99 mg/dL    Comment: Glucose reference range applies only to samples taken after fasting for at least 8 hours.   BUN 16 8 - 23 mg/dL   Creatinine, Ser 0.99 0.44 - 1.00 mg/dL   Calcium 9.0 8.9 - 10.3 mg/dL   GFR, Estimated 55 (L) >60 mL/min    Comment: (NOTE) Calculated using the CKD-EPI Creatinine Equation (2021)    Anion gap 8 5 - 15    Comment: Performed at [facility], [address] [place], [place] [address]  CBC with Differential     Status: Abnormal   Collection Time: 03/11/22  5:55 PM  Result Value Ref Range   WBC 12.8 (H) 4.0 - 10.5 K/uL   RBC 4.42 3.87 - 5.11 MIL/uL   Hemoglobin 13.9 12.0 - 15.0 g/dL   HCT 40.6 36.0 - 46.0 %   MCV 91.9 80.0 - 100.0 fL   MCH 31.4 26.0 - 34.0 pg   MCHC 34.2 30.0 - 36.0 g/dL   RDW 13.0 11.5 - 15.5 %   Platelets 192 150 - 400 K/uL   nRBC 0.0 0.0 - 0.2 %   Neutrophils Relative % 76 %   Neutro Abs 9.8 (H) 1.7 - 7.7 K/uL   Lymphocytes Relative 15 %   Lymphs Abs 1.9 0.7 - 4.0 K/uL   Monocytes Relative 6 %   Monocytes Absolute 0.8 0.1 - 1.0 K/uL   Eosinophils Relative 1 %   Eosinophils Absolute 0.1 0.0 - 0.5 K/uL   Basophils Relative 1 %   Basophils Absolute 0.1 0.0 - 0.1 K/uL   Immature Granulocytes 1 %   Abs Immature Granulocytes 0.14 (H) 0.00 - 0.07 K/uL    Comment: Performed at [facility], [place] [address], [place], [place] [address]  Protime-INR     Status: None   Collection Time: 03/11/22  5:55 PM  Result Value Ref  Range   Prothrombin Time 13.3 11.4 - 15.2 seconds   INR 1.0 0.8 - 1.2    Comment: (NOTE) INR goal varies based on device and disease states. Performed at [facility], [place] [address] [place], [place] [address]   Type and screen Drew     Status: None (Preliminary result)   Collection Time: 03/11/22  5:55 PM  Result Value Ref Range   ABO/RH(D) O POS    Antibody Screen PENDING    Sample Expiration      03/14/2022,2359 Performed at [facility], [place] [address], [place], [place] [address]     DG Hip Unilat With Pelvis 2-3 Views Right  Result Date: 03/11/2022 CLINICAL DATA:  Fall EXAM: DG HIP (WITH OR WITHOUT PELVIS) 2-3V RIGHT COMPARISON:  No prior hip radiographs, correlation is made with CT abdomen pelvis 02/06/2021 FINDINGS: Impacted, foreshortened fracture of the right femoral neck. No other fracture is seen in the pelvis or left hip degenerative changes in the lumbar spine. Nonobstructive bowel-gas pattern. IMPRESSION: Right femoral neck fracture. Electronically Signed   By: Merilyn Baba M.D.   On: 03/11/2022 18:27   DG Chest 1 View  Result Date: 03/11/2022 CLINICAL DATA:  Un witnessed fall, right hip pain EXAM: CHEST  1 VIEW COMPARISON:  02/09/2021 FINDINGS: Single frontal view of the chest demonstrates an unremarkable cardiac silhouette. No airspace disease, effusion, or pneumothorax. Left shoulder arthroplasty. No acute fractures. IMPRESSION: 1. No acute intrathoracic process. Electronically Signed   By: Randa Ngo M.D.   On: 03/11/2022 18:26    Review of Systems  Constitutional:  Negative for chills and fever.  Respiratory:  Negative for shortness of breath.   Cardiovascular:  Negative for chest pain.  Gastrointestinal:  Negative for abdominal pain.  Musculoskeletal:  Positive for arthralgias and gait problem.  Psychiatric/Behavioral:  Positive for agitation.    Blood pressure 127/60, pulse (!) 57,  temperature 97.9 F (36.6 C), temperature source Oral, resp. rate 17, SpO2 95 %. Physical Exam Constitutional:      Appearance: Normal appearance.  She is normal weight.  HENT:     Head: Normocephalic and atraumatic.     Nose: Nose normal.  Eyes:     Extraocular Movements: Extraocular movements intact.  Cardiovascular:     Rate and Rhythm: Normal rate.     Pulses: Normal pulses.  Pulmonary:     Effort: Pulmonary effort is normal.     Breath sounds: Normal breath sounds.  Musculoskeletal:     Cervical back: Normal range of motion.     Comments: No open wounds or abrasions about the right hip or thigh. Tender to palpation about the right anterolateral thigh and hip region. Able to dorsiflex and plantar flex BLE without difficulty. Calves soft and nontender. Distally neurovascularly intact.   Neurological:     Mental Status: She is alert. Mental status is at baseline.     Assessment/Plan: Right femoral neck fracture Plan for anterior right hip arthroplasty by Dr. Lyla Glassing tomorrow afternoon. Patient will be NPO after midnight. Discussed risks, benefits, and expected post procedure course with patient's family. Patient's daughter will likely need to sign the consent for surgery.   Kalonji Zurawski L. [place], PA-C 03/11/2022, 8:06 PM

## 2022-03-11 NOTE — Assessment & Plan Note (Addendum)
Unfortunately, pt already appears to be developing worsening confusion / delirium / sundowning in the ED since after daughter left. Presumably delirium secondary to hip fracture superimposed on rather significant dementia at baseline. 1. Pt refusing PO meds right now: 1. Will switch pain meds and PRN haldol (she takes for agitation at baseline based on med-rec) to IV

## 2022-03-11 NOTE — Assessment & Plan Note (Addendum)
Cont home BP meds Hold lasix.

## 2022-03-11 NOTE — ED Notes (Signed)
Pt daughter [name] would like to be called with all updates [phone]

## 2022-03-11 NOTE — H&P (Addendum)
History and Physical    Patient: Suzanne Monroe [id number]:[id number] DOB: [birth year] DOA: 03/11/2022 DOS: the patient was seen and examined on 03/11/2022 PCP: Patient, No Pcp Per  Patient coming from:  [facility]  Chief Complaint:  Chief Complaint  Patient presents with   Fall   HPI: Suzanne Monroe is a 88 y.o. female with medical history significant of HTN, dementia.  Pt had unwitnessed fall in bathroom today.  Found shortly thereafter with severe hip pain, unable to stand or ambulate.  Denies LOC or hitting head.  Not on any AC.  Found to have R hip fx.   Review of Systems: unable to review all systems due to the inability of the patient to answer questions. Past Medical History:  Diagnosis Date   Anxiety    Arthritis    OA   Cerumen impaction    recurrent Lt   Depression    Dry skin    severe dry skin/ichthyosis( Uses tanner understands risk)   Hypertension    Macular degeneration    Migraine    Hx of   Peripheral neuropathy    Past Surgical History:  Procedure Laterality Date   APPENDECTOMY     CHOLECYSTECTOMY     RHINOPLASTY     SHOULDER SURGERY     left shoulder replacement   Social History:  reports that she quit smoking about 38 years ago. She has never used smokeless tobacco. She reports that she does not drink alcohol and does not use drugs.  Allergies  Allergen Reactions   Ampicillin     REACTION: reaction not known   Cefaclor     REACTION: diarrhea   Codeine     REACTION: diarrhea   Duloxetine     REACTION: severe diarrhea   Fexofenadine     REACTION: fuzzy   Ibuprofen     REACTION: GI   Loratadine     REACTION: blurred vision   Nsaids     REACTION: fatigue    Family History  Problem Relation Age of Onset   Stroke Mother    Hypertension Mother    Alcohol abuse Father    Heart disease Father    Kidney disease Father        dialysis   Hypertension Brother    Hypertension Daughter    Cancer Maternal Aunt        colon CA   Cancer  Maternal Uncle        liver CA    Prior to Admission medications   Medication Sig Start Date End Date Taking? Authorizing Provider  acetaminophen (TYLENOL) 500 MG tablet Take 1 tablet (500 mg total) by mouth every 6 (six) hours as needed for moderate pain. 02/11/21  Yes Thurnell Lose, MD  amLODipine (NORVASC) 10 MG tablet Take 1 tablet (10 mg total) by mouth daily. 02/11/21  Yes Thurnell Lose, MD  Calcium Carbonate-Vitamin D 600-400 MG-UNIT tablet Take 1 tablet by mouth daily. 02/11/21  Yes Thurnell Lose, MD  divalproex (DEPAKOTE SPRINKLE) 125 MG capsule Take 125 mg by mouth 3 (three) times daily.   Yes [provider]  donepezil (ARICEPT) 5 MG tablet Take 1 tablet (5 mg total) by mouth at bedtime. 02/11/21  Yes Thurnell Lose, MD  Emollient (AQUAPHOR ADV PROTECT HEALING) 41 % OINT Apply 1 Application topically daily.   Yes [provider]  furosemide (LASIX) 20 MG tablet Take 1 tablet (20 mg total) by mouth every other day. 02/11/21  Yes Thurnell Lose, MD  guaifenesin (ROBITUSSIN) 100 MG/5ML syrup Take 10 mLs (200 mg total) by mouth every 6 (six) hours as needed for cough. 02/11/21  Yes Thurnell Lose, MD  haloperidol (HALDOL) 5 MG tablet Take 5 mg by mouth daily as needed for agitation.   Yes [provider]  lisinopril (ZESTRIL) 40 MG tablet Take 40 mg by mouth daily.   Yes [provider]  loperamide (IMODIUM A-D) 2 MG tablet Take 2 tablets (4 mg total) by mouth in the morning and at bedtime. 02/11/21  Yes Thurnell Lose, MD  magnesium hydroxide (MILK OF MAGNESIA) 400 MG/5ML suspension Take 30 mLs by mouth at bedtime as needed for mild constipation. 02/11/21  Yes Thurnell Lose, MD  melatonin 3 MG TABS tablet Take 1 tablet (3 mg total) by mouth at bedtime. 02/11/21  Yes Thurnell Lose, MD  neomycin-bacitracin-polymyxin (NEOSPORIN) [phone] ointment Apply 1 application topically daily as needed (minor skin tears/ abrasions). 02/11/21   Yes Thurnell Lose, MD  Nutritional Supplements (ENSURE ORIGINAL) LIQD Take 237 mLs by mouth 3 (three) times daily. 02/11/21  Yes Thurnell Lose, MD  PARoxetine (PAXIL) 20 MG tablet TAKE 1/2 TABLET BY MOUTH EVERY MORNING AND 1/2 TABLET EACH EVENING Patient taking differently: Take 20 mg by mouth daily. 02/11/21  Yes Thurnell Lose, MD  propranolol (INDERAL) 60 MG tablet Take 2 tablets (120 mg total) by mouth 2 (two) times daily. TAKE 2 TABLETS (120 MG TOTAL) BY MOUTH 2 (TWO) TIMES DAILY. 02/11/21  Yes Thurnell Lose, MD  Psyllium Husk 100 % POWD Take 1 packet by mouth daily as needed (For diarrhea).   Yes [provider]  QUEtiapine (SEROQUEL) 25 MG tablet Take 1 tablet (25 mg total) by mouth at bedtime. 02/11/21  Yes Thurnell Lose, MD  Skin Protectants, Misc. (MINERIN CREME) CREA Apply 1 application topically as needed (dry skin). 02/11/21  Yes Thurnell Lose, MD  zinc oxide 20 % ointment Apply 1 Application topically daily.   Yes [provider]  alum & mag hydroxide-simeth (MAALOX/MYLANTA) 200-200-20 MG/5ML suspension Take 30 mLs by mouth every 6 (six) hours as needed for indigestion (heartburn). Patient not taking: Reported on 03/11/2022 02/11/21   Thurnell Lose, MD  lisinopril (ZESTRIL) 20 MG tablet Take 1 tablet (20 mg total) by mouth daily. Patient not taking: Reported on 03/11/2022 02/11/21   Thurnell Lose, MD  mirtazapine (REMERON) 15 MG tablet Take 1 tablet (15 mg total) by mouth at bedtime. Patient not taking: Reported on 03/11/2022 02/11/21   Thurnell Lose, MD  Probiotic Product (PROBIOTIC BLEND) CAPS Take 1 capsule by mouth daily. Patient not taking: Reported on 03/11/2022 02/11/21   Thurnell Lose, MD    Physical Exam: Vitals:   03/11/22 2045 03/11/22 2100 03/11/22 2115 03/11/22 2122  BP:      Pulse: 61 60 61 62  Resp: '13 13 14 14  '$ Temp:      TempSrc:      SpO2: 94% 94% 93% 95%   Constitutional: Alert, mild agitation Eyes: PERRL, lids  and conjunctivae normal ENMT: Mucous membranes are moist. Posterior pharynx clear of any exudate or lesions.Normal dentition.  Neck: normal, supple, no masses, no thyromegaly Respiratory: clear to auscultation bilaterally, no wheezing, no crackles. Normal respiratory effort. No accessory muscle use.  Cardiovascular: Regular rate and rhythm, no murmurs / rubs / gallops. No extremity edema. 2+ pedal pulses. No carotid bruits.  Abdomen: no tenderness,  no masses palpated. No hepatosplenomegaly. Bowel sounds positive.  Musculoskeletal: TTP R hip. Skin: no rashes, lesions, ulcers. No induration Neurologic: CN 2-12 grossly intact. Sensation intact, DTR normal. Strength 5/5 in all 4.  Psychiatric: Confused  Data Reviewed:    X ray reveals: R femoral neck fx.  CXR = nothing acute  EKG = unchanged from priors, no worrisome ST findings.  QT = less than half distance from R to R.  Has what appears to be U waves visible in lead II and III though.  Assessment and Plan: * Closed right hip fracture, initial encounter ([place]) Hip fx pathway NPO after MN OR with Dr. Delfino Lovett tomorrow afternoon Lyndel Safe score = 0.5% Biggest concern in this patients case is dementia + delirium which she seems to be developing already in ED. EKG: ? U waves in lead II and III that I think is making QTc appear prolonged to computer (even though T wave ends in a reasonable time frame visibly). K = 3.7 on initial BMP so doesn't look hypokalemic Will order repeat BMP stat and mag to verify absence of hypokalemia given EKG findings Tele monitor  Acute encephalopathy Unfortunately, pt already appears to be developing worsening confusion / delirium / sundowning in the ED since after daughter left. Presumably delirium secondary to hip fracture superimposed on rather significant dementia at baseline. Pt refusing PO meds right now: Will switch pain meds and PRN haldol (she takes for agitation at baseline based on med-rec) to  IV  Dementia without behavioral disturbance ([place]) Cont home dementia meds.  Essential hypertension Cont home BP meds Hold lasix.      Advance Care Planning:   Code Status: Full Code  Consults: Ortho  Family Communication: No family in room  Severity of Illness: The appropriate patient status for this patient is INPATIENT. Inpatient status is judged to be reasonable and necessary in order to provide the required intensity of service to ensure the patient's safety. The patient's presenting symptoms, physical exam findings, and initial radiographic and laboratory data in the context of their chronic comorbidities is felt to place them at high risk for further clinical deterioration. Furthermore, it is not anticipated that the patient will be medically stable for discharge from the hospital within 2 midnights of admission.   * I certify that at the point of admission it is my clinical judgment that the patient will require inpatient hospital care spanning beyond 2 midnights from the point of admission due to high intensity of service, high risk for further deterioration and high frequency of surveillance required.*  Author: Etta Quill., DO 03/11/2022 11:04 PM  For on call review [email or web address].

## 2022-03-11 NOTE — Assessment & Plan Note (Addendum)
1. Hip fx pathway 2. NPO after MN 3. OR with Dr. Delfino Lovett tomorrow afternoon 4. Gupta score = 0.5% 1. Biggest concern in this patients case is dementia + delirium which she seems to be developing already in ED. 5. EKG: ? U waves in lead II and III that I think is making QTc appear prolonged to computer (even though T wave ends in a reasonable time frame visibly). 1. K = 3.7 on initial BMP so doesn't look hypokalemic 2. Will order repeat BMP stat and mag to verify absence of hypokalemia given EKG findings 3. Tele monitor

## 2022-03-12 ENCOUNTER — Encounter (HOSPITAL_COMMUNITY)
Admission: EM | Disposition: A | Discharge status: Skilled Nursing Facility | Payer: Self-pay | Source: Skilled Nursing Facility | Attending: Internal Medicine

## 2022-03-12 ENCOUNTER — Inpatient Hospital Stay (HOSPITAL_COMMUNITY): Payer: Medicare Other

## 2022-03-12 ENCOUNTER — Inpatient Hospital Stay (HOSPITAL_COMMUNITY): Payer: Medicare Other | Admitting: Certified Registered Nurse Anesthetist

## 2022-03-12 DIAGNOSIS — S72001A Fracture of unspecified part of neck of right femur, initial encounter for closed fracture: Secondary | ICD-10-CM

## 2022-03-12 DIAGNOSIS — I10 Essential (primary) hypertension: Secondary | ICD-10-CM | POA: Diagnosis not present

## 2022-03-12 DIAGNOSIS — F418 Other specified anxiety disorders: Secondary | ICD-10-CM | POA: Diagnosis not present

## 2022-03-12 DIAGNOSIS — Z87891 Personal history of nicotine dependence: Secondary | ICD-10-CM

## 2022-03-12 DIAGNOSIS — F0390 Unspecified dementia without behavioral disturbance: Secondary | ICD-10-CM | POA: Diagnosis not present

## 2022-03-12 DIAGNOSIS — G9340 Encephalopathy, unspecified: Secondary | ICD-10-CM | POA: Diagnosis not present

## 2022-03-12 DIAGNOSIS — E440 Moderate protein-calorie malnutrition: Secondary | ICD-10-CM | POA: Insufficient documentation

## 2022-03-12 HISTORY — PX: TOTAL HIP ARTHROPLASTY: SHX124

## 2022-03-12 LAB — BASIC METABOLIC PANEL
Anion gap: 10 (ref 5–15)
BUN: 15 mg/dL (ref 8–23)
CO2: 22 mmol/L (ref 22–32)
Calcium: 9.3 mg/dL (ref 8.9–10.3)
Chloride: 109 mmol/L (ref 98–111)
Creatinine, Ser: 0.95 mg/dL (ref 0.44–1.00)
GFR, Estimated: 58 mL/min — ABNORMAL LOW (ref >60)
Glucose, Bld: 140 mg/dL — ABNORMAL HIGH (ref 70–99)
Potassium: 4.2 mmol/L (ref 3.5–5.1)
Sodium: 141 mmol/L (ref 135–145)

## 2022-03-12 LAB — CBC
HCT: 40.1 % (ref 36.0–46.0)
Hemoglobin: 13.2 g/dL (ref 12.0–15.0)
MCH: 30.9 pg (ref 26.0–34.0)
MCHC: 32.9 g/dL (ref 30.0–36.0)
MCV: 93.9 fL (ref 80.0–100.0)
Platelets: 173 10*3/uL (ref 150–400)
RBC: 4.27 MIL/uL (ref 3.87–5.11)
RDW: 13.2 % (ref 11.5–15.5)
WBC: 14.6 10*3/uL — ABNORMAL HIGH (ref 4.0–10.5)
nRBC: 0 % (ref 0.0–0.2)

## 2022-03-12 LAB — MAGNESIUM: Magnesium: 1.9 mg/dL (ref 1.7–2.4)

## 2022-03-12 LAB — CREATININE, SERUM
Creatinine, Ser: 0.83 mg/dL (ref 0.44–1.00)
GFR, Estimated: >60 mL/min (ref >60)

## 2022-03-12 LAB — MRSA NEXT GEN BY PCR, NASAL: MRSA by PCR Next Gen: NOT DETECTED

## 2022-03-12 SURGERY — ARTHROPLASTY, HIP, TOTAL, ANTERIOR APPROACH
Anesthesia: General | Site: Hip | Laterality: Right

## 2022-03-12 MED ORDER — EPHEDRINE 5 MG/ML INJ
INTRAVENOUS | Status: AC
  Filled 2022-03-12: qty 5

## 2022-03-12 MED ORDER — DOCUSATE SODIUM 100 MG PO CAPS
100.0000 mg | ORAL_CAPSULE | Freq: Two times a day (BID) | ORAL | Status: DC
  Administered 2022-03-12 – 2022-03-17 (×11): 100 mg via ORAL
  Filled 2022-03-12 (×11): qty 1

## 2022-03-12 MED ORDER — DEXAMETHASONE SODIUM PHOSPHATE 10 MG/ML IJ SOLN
INTRAMUSCULAR | Status: DC | PRN
  Administered 2022-03-12: 8 mg via INTRAVENOUS

## 2022-03-12 MED ORDER — ONDANSETRON HCL 4 MG/2ML IJ SOLN: 4.0000 mg | Freq: Four times a day (QID) | INTRAMUSCULAR | Status: DC | PRN

## 2022-03-12 MED ORDER — METHOCARBAMOL 1000 MG/10ML IJ SOLN: 500.0000 mg | Freq: Four times a day (QID) | INTRAVENOUS | Status: DC | PRN

## 2022-03-12 MED ORDER — PHENOL 1.4 % MT LIQD: 1.0000 | OROMUCOSAL | Status: DC | PRN

## 2022-03-12 MED ORDER — EPHEDRINE SULFATE-NACL 50-0.9 MG/10ML-% IV SOSY
PREFILLED_SYRINGE | INTRAVENOUS | Status: DC | PRN
  Administered 2022-03-12: 10 mg via INTRAVENOUS

## 2022-03-12 MED ORDER — HYDROCODONE-ACETAMINOPHEN 5-325 MG PO TABS: 1.0000 | ORAL_TABLET | ORAL | Status: DC | PRN

## 2022-03-12 MED ORDER — ONDANSETRON HCL 4 MG/2ML IJ SOLN: 4.0000 mg | Freq: Once | INTRAMUSCULAR | Status: DC | PRN

## 2022-03-12 MED ORDER — OXYCODONE HCL 5 MG/5ML PO SOLN: 5.0000 mg | Freq: Once | ORAL | Status: DC | PRN

## 2022-03-12 MED ORDER — ISOPROPYL ALCOHOL 70 % SOLN
Status: DC | PRN
  Administered 2022-03-12: 1 via TOPICAL

## 2022-03-12 MED ORDER — BUPIVACAINE-EPINEPHRINE (PF) 0.25% -1:200000 IJ SOLN
INTRAMUSCULAR | Status: AC
  Filled 2022-03-12: qty 30

## 2022-03-12 MED ORDER — KETOROLAC TROMETHAMINE 30 MG/ML IJ SOLN
INTRAMUSCULAR | Status: DC | PRN
  Administered 2022-03-12: 30 mg via INTRAVENOUS

## 2022-03-12 MED ORDER — ACETAMINOPHEN 325 MG PO TABS: 325.0000 mg | ORAL_TABLET | Freq: Four times a day (QID) | ORAL | Status: DC | PRN

## 2022-03-12 MED ORDER — SODIUM CHLORIDE (PF) 0.9 % IJ SOLN
INTRAMUSCULAR | Status: DC | PRN
  Administered 2022-03-12: 30 mL via INTRAVENOUS

## 2022-03-12 MED ORDER — METOCLOPRAMIDE HCL 5 MG/ML IJ SOLN: 5.0000 mg | Freq: Three times a day (TID) | INTRAMUSCULAR | Status: DC | PRN

## 2022-03-12 MED ORDER — ROCURONIUM BROMIDE 10 MG/ML (PF) SYRINGE
PREFILLED_SYRINGE | INTRAVENOUS | Status: DC | PRN
  Administered 2022-03-12: 20 mg via INTRAVENOUS
  Administered 2022-03-12: 50 mg via INTRAVENOUS

## 2022-03-12 MED ORDER — METOCLOPRAMIDE HCL 5 MG PO TABS: 5.0000 mg | ORAL_TABLET | Freq: Three times a day (TID) | ORAL | Status: DC | PRN

## 2022-03-12 MED ORDER — SENNA 8.6 MG PO TABS
1.0000 | ORAL_TABLET | Freq: Two times a day (BID) | ORAL | Status: DC
  Administered 2022-03-12 – 2022-03-17 (×11): 8.6 mg via ORAL
  Filled 2022-03-12 (×11): qty 1

## 2022-03-12 MED ORDER — FENTANYL CITRATE (PF) 100 MCG/2ML IJ SOLN
INTRAMUSCULAR | Status: DC | PRN
  Administered 2022-03-12 (×2): 25 ug via INTRAVENOUS

## 2022-03-12 MED ORDER — FENTANYL CITRATE PF 50 MCG/ML IJ SOSY: 25.0000 ug | PREFILLED_SYRINGE | INTRAMUSCULAR | Status: DC | PRN

## 2022-03-12 MED ORDER — ONDANSETRON HCL 4 MG/2ML IJ SOLN
INTRAMUSCULAR | Status: DC | PRN
  Administered 2022-03-12: 4 mg via INTRAVENOUS

## 2022-03-12 MED ORDER — HYDROCODONE-ACETAMINOPHEN 7.5-325 MG PO TABS
1.0000 | ORAL_TABLET | ORAL | Status: DC | PRN
  Filled 2022-03-12: qty 1

## 2022-03-12 MED ORDER — ENSURE ENLIVE PO LIQD
237.0000 mL | Freq: Two times a day (BID) | ORAL | Status: DC
  Administered 2022-03-12 – 2022-03-17 (×8): 237 mL via ORAL

## 2022-03-12 MED ORDER — ENOXAPARIN SODIUM 30 MG/0.3ML IJ SOSY
30.0000 mg | PREFILLED_SYRINGE | INTRAMUSCULAR | Status: DC
  Administered 2022-03-13 – 2022-03-18 (×6): 30 mg via SUBCUTANEOUS
  Filled 2022-03-12 (×6): qty 0.3

## 2022-03-12 MED ORDER — ACETAMINOPHEN 500 MG PO TABS
500.0000 mg | ORAL_TABLET | Freq: Four times a day (QID) | ORAL | Status: AC
  Administered 2022-03-12 – 2022-03-13 (×4): 500 mg via ORAL
  Filled 2022-03-12 (×4): qty 1

## 2022-03-12 MED ORDER — SUGAMMADEX SODIUM 200 MG/2ML IV SOLN
INTRAVENOUS | Status: DC | PRN
  Administered 2022-03-12: 200 mg via INTRAVENOUS

## 2022-03-12 MED ORDER — PROPOFOL 10 MG/ML IV BOLUS
INTRAVENOUS | Status: DC | PRN
  Administered 2022-03-12: 130 mg via INTRAVENOUS

## 2022-03-12 MED ORDER — ROCURONIUM BROMIDE 10 MG/ML (PF) SYRINGE
PREFILLED_SYRINGE | INTRAVENOUS | Status: AC
  Filled 2022-03-12: qty 10

## 2022-03-12 MED ORDER — ORAL CARE MOUTH RINSE: 15.0000 mL | Freq: Once | OROMUCOSAL | Status: DC

## 2022-03-12 MED ORDER — POLYETHYLENE GLYCOL 3350 17 G PO PACK: 17.0000 g | PACK | Freq: Every day | ORAL | Status: DC | PRN

## 2022-03-12 MED ORDER — OXYCODONE HCL 5 MG PO TABS: 5.0000 mg | ORAL_TABLET | Freq: Once | ORAL | Status: DC | PRN

## 2022-03-12 MED ORDER — CHLORHEXIDINE GLUCONATE 0.12 % MT SOLN: 15.0000 mL | Freq: Once | OROMUCOSAL | Status: DC

## 2022-03-12 MED ORDER — SODIUM CHLORIDE (PF) 0.9 % IJ SOLN
INTRAMUSCULAR | Status: AC
  Filled 2022-03-12: qty 50

## 2022-03-12 MED ORDER — PROPOFOL 10 MG/ML IV BOLUS
INTRAVENOUS | Status: AC
  Filled 2022-03-12: qty 20

## 2022-03-12 MED ORDER — ORAL CARE MOUTH RINSE: 15.0000 mL | OROMUCOSAL | Status: DC | PRN

## 2022-03-12 MED ORDER — WATER FOR IRRIGATION, STERILE IR SOLN
Status: DC | PRN
  Administered 2022-03-12: 2000 mL

## 2022-03-12 MED ORDER — KETOROLAC TROMETHAMINE 30 MG/ML IJ SOLN
INTRAMUSCULAR | Status: AC
  Filled 2022-03-12: qty 1

## 2022-03-12 MED ORDER — MORPHINE SULFATE (PF) 2 MG/ML IV SOLN
0.5000 mg | INTRAVENOUS | Status: DC | PRN
  Administered 2022-03-14: 1 mg via INTRAVENOUS
  Filled 2022-03-12: qty 1

## 2022-03-12 MED ORDER — LIDOCAINE HCL (CARDIAC) PF 100 MG/5ML IV SOSY
PREFILLED_SYRINGE | INTRAVENOUS | Status: DC | PRN
  Administered 2022-03-12: 80 mg via INTRAVENOUS

## 2022-03-12 MED ORDER — DEXAMETHASONE SODIUM PHOSPHATE 10 MG/ML IJ SOLN
INTRAMUSCULAR | Status: AC
  Filled 2022-03-12: qty 1

## 2022-03-12 MED ORDER — LACTATED RINGERS IV SOLN: INTRAVENOUS | Status: DC

## 2022-03-12 MED ORDER — ONDANSETRON HCL 4 MG/2ML IJ SOLN
INTRAMUSCULAR | Status: AC
  Filled 2022-03-12: qty 2

## 2022-03-12 MED ORDER — SODIUM CHLORIDE 0.9 % IR SOLN
Status: DC | PRN
  Administered 2022-03-12: 1000 mL

## 2022-03-12 MED ORDER — ONDANSETRON HCL 4 MG PO TABS: 4.0000 mg | ORAL_TABLET | Freq: Four times a day (QID) | ORAL | Status: DC | PRN

## 2022-03-12 MED ORDER — FENTANYL CITRATE (PF) 100 MCG/2ML IJ SOLN
INTRAMUSCULAR | Status: AC
  Filled 2022-03-12: qty 2

## 2022-03-12 MED ORDER — MENTHOL 3 MG MT LOZG
1.0000 | LOZENGE | OROMUCOSAL | Status: DC | PRN
Start: 2022-03-12 — End: 2022-03-18

## 2022-03-12 MED ORDER — METHOCARBAMOL 500 MG PO TABS
500.0000 mg | ORAL_TABLET | Freq: Four times a day (QID) | ORAL | Status: DC | PRN
  Administered 2022-03-12 – 2022-03-14 (×3): 500 mg via ORAL
  Filled 2022-03-12 (×3): qty 1

## 2022-03-12 MED ORDER — ACETAMINOPHEN 10 MG/ML IV SOLN
INTRAVENOUS | Status: AC
  Filled 2022-03-12: qty 100

## 2022-03-12 MED ORDER — ADULT MULTIVITAMIN W/MINERALS CH
1.0000 | ORAL_TABLET | Freq: Every day | ORAL | Status: DC
  Administered 2022-03-13 – 2022-03-17 (×5): 1 via ORAL
  Filled 2022-03-12 (×5): qty 1

## 2022-03-12 MED ORDER — LIDOCAINE HCL (PF) 2 % IJ SOLN
INTRAMUSCULAR | Status: AC
  Filled 2022-03-12: qty 5

## 2022-03-12 MED ORDER — BUPIVACAINE-EPINEPHRINE 0.25% -1:200000 IJ SOLN
INTRAMUSCULAR | Status: DC | PRN
  Administered 2022-03-12: 30 mL

## 2022-03-12 MED ORDER — ISOPROPYL ALCOHOL 70 % SOLN
Status: AC
  Filled 2022-03-12: qty 480

## 2022-03-12 SURGICAL SUPPLY — 62 items
ADH SKN CLS APL DERMABOND .7 (GAUZE/BANDAGES/DRESSINGS) ×1
APL PRP STRL LF DISP 70% ISPRP (MISCELLANEOUS) ×1
BAG COUNTER SPONGE SURGICOUNT (BAG) ×1 IMPLANT
BAG DECANTER FOR FLEXI CONT (MISCELLANEOUS) IMPLANT
BAG SPEC THK2 15X12 ZIP CLS (MISCELLANEOUS)
BAG SPNG CNTER NS LX DISP (BAG) ×1
BAG ZIPLOCK 12X15 (MISCELLANEOUS) IMPLANT
CHLORAPREP W/TINT 26 (MISCELLANEOUS) ×2 IMPLANT
COVER PERINEAL POST (MISCELLANEOUS) ×2 IMPLANT
COVER SURGICAL LIGHT HANDLE (MISCELLANEOUS) ×2 IMPLANT
DERMABOND ADVANCED (GAUZE/BANDAGES/DRESSINGS) ×1
DERMABOND ADVANCED .7 DNX12 (GAUZE/BANDAGES/DRESSINGS) ×2 IMPLANT
DRAPE IMP U-DRAPE 54X76 (DRAPES) ×2 IMPLANT
DRAPE SHEET LG 3/4 BI-LAMINATE (DRAPES) ×6 IMPLANT
DRAPE STERI IOBAN 125X83 (DRAPES) ×2 IMPLANT
DRAPE U-SHAPE 47X51 STRL (DRAPES) ×4 IMPLANT
DRSG AQUACEL AG ADV 3.5X10 (GAUZE/BANDAGES/DRESSINGS) ×2 IMPLANT
ELECT REM PT RETURN 15FT ADLT (MISCELLANEOUS) ×2 IMPLANT
GAUZE SPONGE 4X4 12PLY STRL (GAUZE/BANDAGES/DRESSINGS) ×2 IMPLANT
GLOVE BIO SURGEON STRL SZ8.5 (GLOVE) ×4 IMPLANT
GLOVE BIOGEL M 7.0 STRL (GLOVE) ×2 IMPLANT
GLOVE BIOGEL PI IND STRL 7.5 (GLOVE) ×1 IMPLANT
GLOVE BIOGEL PI IND STRL 8.5 (GLOVE) ×1 IMPLANT
GLOVE BIOGEL PI INDICATOR 7.5 (GLOVE) ×1
GLOVE BIOGEL PI INDICATOR 8.5 (GLOVE) ×1
GLOVE SURG LX 7.5 STRW (GLOVE) ×2
GLOVE SURG LX STRL 7.5 STRW (GLOVE) ×2 IMPLANT
GOWN SPEC L3 XXLG W/TWL (GOWN DISPOSABLE) ×2 IMPLANT
GOWN STRL REUS W/ TWL XL LVL3 (GOWN DISPOSABLE) ×1 IMPLANT
GOWN STRL REUS W/TWL XL LVL3 (GOWN DISPOSABLE) ×2
HANDPIECE INTERPULSE COAX TIP (DISPOSABLE) ×2
HEAD MOD COCR 28MM HD -6MM NK (Orthopedic Implant) ×1 IMPLANT
HOLDER FOLEY CATH W/STRAP (MISCELLANEOUS) ×2 IMPLANT
HOOD PEEL AWAY FLYTE STAYCOOL (MISCELLANEOUS) ×6 IMPLANT
KIT TURNOVER KIT A (KITS) ×1 IMPLANT
MANIFOLD NEPTUNE II (INSTRUMENTS) ×2 IMPLANT
MARKER SKIN DUAL TIP RULER LAB (MISCELLANEOUS) ×2 IMPLANT
NDL SAFETY ECLIPSE 18X1.5 (NEEDLE) ×1 IMPLANT
NDL SPNL 18GX3.5 QUINCKE PK (NEEDLE) ×1 IMPLANT
NEEDLE HYPO 18GX1.5 SHARP (NEEDLE) ×2
NEEDLE SPNL 18GX3.5 QUINCKE PK (NEEDLE) ×2 IMPLANT
PACK ANTERIOR HIP CUSTOM (KITS) ×2 IMPLANT
PENCIL SMOKE EVACUATOR (MISCELLANEOUS) ×1 IMPLANT
RINGBLOC BI POLAR 28X45MM (Orthopedic Implant) ×2 IMPLANT
SAW OSC TIP CART 19.5X105X1.3 (SAW) ×2 IMPLANT
SEALER BIPOLAR AQUA 6.0 (INSTRUMENTS) ×2 IMPLANT
SET HNDPC FAN SPRY TIP SCT (DISPOSABLE) ×1 IMPLANT
SHELL RINGBLOC BI POLR 28X45MM (Orthopedic Implant) IMPLANT
SOLUTION PRONTOSAN WOUND 350ML (IRRIGATION / IRRIGATOR) ×2 IMPLANT
SPIKE FLUID TRANSFER (MISCELLANEOUS) ×2 IMPLANT
STEM FEM CMTLS TL 12X144 123D (Stem) ×1 IMPLANT
SUT MNCRL AB 3-0 PS2 18 (SUTURE) ×2 IMPLANT
SUT MON AB 2-0 CT1 36 (SUTURE) ×2 IMPLANT
SUT STRATAFIX PDO 1 14 VIOLET (SUTURE) ×2
SUT STRATFX PDO 1 14 VIOLET (SUTURE) ×1
SUT VIC AB 2-0 CT1 27 (SUTURE)
SUT VIC AB 2-0 CT1 TAPERPNT 27 (SUTURE) IMPLANT
SUTURE STRATFX PDO 1 14 VIOLET (SUTURE) ×1 IMPLANT
SYR 3ML LL SCALE MARK (SYRINGE) ×2 IMPLANT
TRAY FOLEY MTR SLVR 16FR STAT (SET/KITS/TRAYS/PACK) IMPLANT
TUBE SUCTION HIGH CAP CLEAR NV (SUCTIONS) ×2 IMPLANT
WATER STERILE IRR 1000ML POUR (IV SOLUTION) ×2 IMPLANT

## 2022-03-12 NOTE — Anesthesia Postprocedure Evaluation (Signed)
Anesthesia Post Note  Patient: Suzanne Monroe  Procedure(s) Performed: TOTAL HIP ARTHROPLASTY ANTERIOR APPROACH (Right: Hip)     Patient location during evaluation: PACU Anesthesia Type: General Level of consciousness: awake and alert Pain management: pain level controlled Vital Signs Assessment: post-procedure vital signs reviewed and stable Respiratory status: spontaneous breathing, nonlabored ventilation, respiratory function stable and patient connected to nasal cannula oxygen Cardiovascular status: blood pressure returned to baseline and stable Postop Assessment: no apparent nausea or vomiting Anesthetic complications: no   No notable events documented.  Last Vitals:  Vitals:   03/12/22 1227 03/12/22 1733  BP: (!) 142/52 (!) 149/60  Pulse: (!) 51 (!) 50  Resp:  12  Temp:  36.7 C  SpO2: 93% 97%    Last Pain:  Vitals:   03/12/22 1733  TempSrc:   PainSc: Asleep                 Sheba Whaling S

## 2022-03-12 NOTE — Progress Notes (Signed)
PROGRESS NOTE    Suzanne Monroe  WJX:[id number] DOB: 28-Jan-1933 DOA: 03/11/2022 PCP: Patient, No Pcp Per    Brief Narrative:  88 year old female with history of dementia, admitted to the hospital after an unwitnessed fall.  Found to have right-sided hip fracture.  Admitted for operative management.   Assessment & Plan:   Principal Problem:   Closed right hip fracture, initial encounter ([place]) Active Problems:   Dementia without behavioral disturbance (HCC)   Acute encephalopathy   Essential hypertension   Malnutrition of moderate degree  Right hip fracture -Status post operative management -Continue pain management -PT/OT  Dementia -Suspect worsening delirium related to hip fracture/hospital appointment -Continue on Aricept -Continue Seroquel, Depakote  Hypertension -Continue home medications -Hold beta-blocker in light of bradycardia   DVT prophylaxis: SCDs Start: 03/11/22 2122  Code Status: Full code Family Communication: No family present Disposition Plan: Status is: Inpatient Remains inpatient appropriate because: Postop care, plan to return to SNF     Consultants:  Orthopedics  Procedures:  Right hip hemiarthroplasty, anterior approach.   Antimicrobials:      Subjective: Patient seen in PACU postprocedure.  She is still in the effects of anesthesia and is very drowsy  Objective: Vitals:   03/12/22 0429 03/12/22 0922 03/12/22 1227 03/12/22 1733  BP: (!) 151/62 (!) 142/56 (!) 142/52 (!) 149/60  Pulse: 62 (!) 52 (!) 51 (!) 50  Resp: '16 20  12  '$ Temp: 98.4 F (36.9 C) 98 F (36.7 C)  98.1 F (36.7 C)  TempSrc: Oral     SpO2: 95% 95% 93% 97%  Weight:      Height:        Intake/Output Summary (Last 24 hours) at 03/12/2022 1839 Last data filed at 03/12/2022 1727 Gross per 24 hour  Intake 825.5 ml  Output 800 ml  Net 25.5 ml   Filed Weights   03/12/22 0100  Weight: 59.4 kg    Examination:  General exam: Somnolent Respiratory system:  Clear to auscultation. Respiratory effort normal. Cardiovascular system: S1 & S2 heard, RRR. No JVD, murmurs, rubs, gallops or clicks. No pedal edema. Gastrointestinal system: Abdomen is nondistended, soft and nontender. No organomegaly or masses felt. Normal bowel sounds heard. Central nervous system:  No focal neurological deficits. Extremities: Symmetric 5 x 5 power. Skin: No rashes, lesions or ulcers Psychiatry: Somnolent    Data Reviewed: I have personally reviewed following labs and imaging studies  CBC: Recent Labs  Lab 03/11/22 1755  WBC 12.8*  NEUTROABS 9.8*  HGB 13.9  HCT 40.6  MCV 91.9  PLT 213   Basic Metabolic Panel: Recent Labs  Lab 03/11/22 1755 03/12/22 0114  NA 142 141  K 3.7 4.2  CL 111 109  CO2 23 22  GLUCOSE 125* 140*  BUN 16 15  CREATININE 0.99 0.95  CALCIUM 9.0 9.3  MG  --  1.9   GFR: Estimated Creatinine Clearance: 34.5 mL/min (by C-G formula based on SCr of 0.95 mg/dL). Liver Function Tests: No results for input(s): "AST", "ALT", "ALKPHOS", "BILITOT", "PROT", "ALBUMIN" in the last 168 hours. No results for input(s): "LIPASE", "AMYLASE" in the last 168 hours. No results for input(s): "AMMONIA" in the last 168 hours. Coagulation Profile: Recent Labs  Lab 03/11/22 1755  INR 1.0   Cardiac Enzymes: No results for input(s): "CKTOTAL", "CKMB", "CKMBINDEX", "TROPONINI" in the last 168 hours. BNP (last 3 results) No results for input(s): "PROBNP" in the last 8760 hours. HbA1C: No results for input(s): "HGBA1C" in the last  72 hours. CBG: No results for input(s): "GLUCAP" in the last 168 hours. Lipid Profile: No results for input(s): "CHOL", "HDL", "LDLCALC", "TRIG", "CHOLHDL", "LDLDIRECT" in the last 72 hours. Thyroid Function Tests: No results for input(s): "TSH", "T4TOTAL", "FREET4", "T3FREE", "THYROIDAB" in the last 72 hours. Anemia Panel: No results for input(s): "VITAMINB12", "FOLATE", "FERRITIN", "TIBC", "IRON", "RETICCTPCT" in the  last 72 hours. Sepsis Labs: No results for input(s): "PROCALCITON", "LATICACIDVEN" in the last 168 hours.  Recent Results (from the past 240 hour(s))  MRSA Next Gen by PCR, Nasal     Status: None   Collection Time: 03/12/22  8:06 AM   Specimen: Nasal Mucosa; Nasal Swab  Result Value Ref Range Status   MRSA by PCR Next Gen NOT DETECTED NOT DETECTED Final    Comment: (NOTE) The GeneXpert MRSA Assay (FDA approved for NASAL specimens only), is one component of a comprehensive MRSA colonization surveillance program. It is not intended to diagnose MRSA infection nor to guide or monitor treatment for MRSA infections. Test performance is not FDA approved in patients less than 33 years old. Performed at [facility], [place] [address] [place], [place] [address]          Radiology Studies: Pelvis Portable  Result Date: 03/12/2022 CLINICAL DATA:  Status post right hip arthroplasty EXAM: PORTABLE PELVIS 1-2 VIEWS COMPARISON:  None Available. FINDINGS: Status post right hip total arthroplasty with expected overlying postoperative change. No evidence of perihardware fracture or component malpositioning. IMPRESSION: Status post right hip total arthroplasty with expected overlying postoperative change. No evidence of perihardware fracture or component malpositioning. Electronically Signed   By: Delanna Ahmadi M.D.   On: 03/12/2022 18:30   DG HIP UNILAT WITH PELVIS 1V RIGHT  Result Date: 03/12/2022 CLINICAL DATA:  Right total hip replacement EXAM: DG HIP (WITH OR WITHOUT PELVIS) 1V RIGHT COMPARISON:  03/11/2022 FINDINGS: Intraoperative fluoroscopy was obtained for surgical control purposes. Fluoroscopy time recorded at 6 seconds. Dose 0.6678 mGy. Four spot fluoroscopic images are submitted. Spot fluoroscopic images demonstrate initially a fracture of the right femoral neck. Subsequent images demonstrate placement of a right hip arthroplasty. Component is incompletely visualized.  IMPRESSION: Intraoperative fluoroscopy obtained for surgical control purposes demonstrating right hip arthroplasty. Electronically Signed   By: Lucienne Capers M.D.   On: 03/12/2022 17:08   DG C-Arm 1-60 Min-No Report  Result Date: 03/12/2022 Fluoroscopy was utilized by the requesting physician.  No radiographic interpretation.   DG C-Arm 1-60 Min-No Report  Result Date: 03/12/2022 Fluoroscopy was utilized by the requesting physician.  No radiographic interpretation.   DG Knee Right Port  Result Date: 03/12/2022 CLINICAL DATA:  Right femur fracture. EXAM: PORTABLE RIGHT KNEE - 1-2 VIEW COMPARISON:  Right knee x-rays dated June 27, 2008. FINDINGS: No evidence of fracture, dislocation, or joint effusion. No evidence of arthropathy or other focal bone abnormality. Soft tissues are unremarkable. IMPRESSION: Negative. Electronically Signed   By: Titus Dubin M.D.   On: 03/12/2022 09:03   DG Hip Unilat With Pelvis 2-3 Views Right  Result Date: 03/11/2022 CLINICAL DATA:  Fall EXAM: DG HIP (WITH OR WITHOUT PELVIS) 2-3V RIGHT COMPARISON:  No prior hip radiographs, correlation is made with CT abdomen pelvis 02/06/2021 FINDINGS: Impacted, foreshortened fracture of the right femoral neck. No other fracture is seen in the pelvis or left hip degenerative changes in the lumbar spine. Nonobstructive bowel-gas pattern. IMPRESSION: Right femoral neck fracture. Electronically Signed   By: Merilyn Baba M.D.   On: 03/11/2022 18:27  DG Chest 1 View  Result Date: 03/11/2022 CLINICAL DATA:  Un witnessed fall, right hip pain EXAM: CHEST  1 VIEW COMPARISON:  02/09/2021 FINDINGS: Single frontal view of the chest demonstrates an unremarkable cardiac silhouette. No airspace disease, effusion, or pneumothorax. Left shoulder arthroplasty. No acute fractures. IMPRESSION: 1. No acute intrathoracic process. Electronically Signed   By: Randa Ngo M.D.   On: 03/11/2022 18:26        Scheduled Meds:  amLODipine   10 mg Oral Daily   calcium-vitamin D  1 tablet Oral Q breakfast   divalproex  125 mg Oral TID   donepezil  5 mg Oral QHS   feeding supplement  237 mL Oral BID BM   lisinopril  40 mg Oral Daily   melatonin  3 mg Oral QHS   [START ON 03/13/2022] multivitamin with minerals  1 tablet Oral Daily   PARoxetine  20 mg Oral Daily   propranolol  120 mg Oral BID   QUEtiapine  25 mg Oral QHS   Continuous Infusions:  sodium chloride 125 mL/hr at 03/12/22 1537   acetaminophen       LOS: 1 day    Time spent: 49mns    JKathie Dike MD [facility]   If 7PM-7AM, please contact night-coverage [email or web address]  03/12/2022, 6:39 PM

## 2022-03-12 NOTE — TOC Initial Note (Addendum)
Transition of Care [facility]) - Initial/Assessment Note    Patient Details  Name: Suzanne Monroe MRN: [id number] Date of Birth: Aug 31, 1932  Transition of Care [facility]) CM/SW Contact:    Dessa Phi, RN Phone Number: 03/12/2022, 3:27 PM  Clinical Narrative:  spoke to dtr Lisa-from [facility]-has rw. Await sx, & PT recc.  [id number].              Expected Discharge Plan:  (TBD) Barriers to Discharge: Continued Medical Work up   Patient Goals and CMS Choice Patient states their goals for this hospitalization and ongoing recovery are:: Rehab CMS Medicare.gov Compare Post Acute Care list provided to:: Patient Represenative (must comment) [name] dtr) Choice offered to / list presented to : Adult Children  Expected Discharge Plan and Services Expected Discharge Plan:  (TBD)   Discharge Planning Services: CM Consult Post Acute Care Choice: [place] Living arrangements for the past 2 months: [place]                                      Prior Living Arrangements/Services Living arrangements for the past 2 months: [place] Lives with:: Facility Resident Patient language and need for interpreter reviewed:: Yes Do you feel safe going back to the place where you live?: Yes      Need for Family Participation in Patient Care: Yes (Comment) Care giver support system in place?: Yes (comment) Current home services: DME (rw) Criminal Activity/Legal Involvement Pertinent to Current Situation/Hospitalization: No - Comment as needed  Activities of Daily Living Home Assistive Devices/Equipment: Environmental consultant (specify type), Shower chair with back ADL Screening (condition at time of admission) Patient's cognitive ability adequate to safely complete daily activities?: No Is the patient deaf or have difficulty hearing?: No Does the patient have difficulty seeing, even when wearing glasses/contacts?: Yes Does the patient have difficulty  concentrating, remembering, or making decisions?: Yes Patient able to express need for assistance with ADLs?: Yes Does the patient have difficulty dressing or bathing?: Yes Independently performs ADLs?: No Communication: Independent Dressing (OT): Needs assistance Is this a change from baseline?: Pre-admission baseline Grooming: Needs assistance Is this a change from baseline?: Pre-admission baseline Feeding: Independent Bathing: Needs assistance Is this a change from baseline?: Pre-admission baseline Toileting: Needs assistance Is this a change from baseline?: Pre-admission baseline In/Out Bed: Needs assistance Is this a change from baseline?: Pre-admission baseline Walks in Home: Dependent Is this a change from baseline?: Pre-admission baseline Does the patient have difficulty walking or climbing stairs?: Yes Weakness of Legs: Both Weakness of Arms/Hands: Both  Permission Sought/Granted Permission sought to share information with : Case Manager Permission granted to share information with : Yes, Verbal Permission Granted  Share Information with NAME: Case manager           Emotional Assessment Appearance:: Appears stated age Attitude/Demeanor/Rapport: Gracious Affect (typically observed): Accepting Orientation: : Oriented to Self Alcohol / Substance Use: Not Applicable Psych Involvement: No (comment)  Admission diagnosis:  Fall, initial encounter [W19.XXXA] Closed right hip fracture, initial encounter ([place]) [S72.001A] Closed displaced fracture of right femoral neck ([place]) [S72.001A] Patient Active Problem List   Diagnosis Date Noted   Closed right hip fracture, initial encounter ([place]) 03/11/2022   Dementia without behavioral disturbance ([place]) 03/11/2022   Acute encephalopathy 03/11/2022   UTI (urinary tract infection) 02/06/2021   Leukocytosis 02/06/2021   AKI (acute kidney injury) ([place]) 02/06/2021  Essential hypertension 10/28/2014   Hyperlipidemia 10/28/2014    Central retinal vein occlusion 10/28/2014   Irregular heartbeat 10/28/2014   Fibromyalgia 05/15/2013   Low TSH level 05/07/2013   Memory loss 05/07/2013   Hypokalemia 05/07/2013   Frequent falls 05/07/2013   Anxiety and depression 10/30/2007   DYSPEPSIA 10/30/2007   HYPERCHOLESTEROLEMIA, PURE 03/30/2007   ICHTHYOSIS 03/29/2007   MIGRAINE, CHRONIC 11/03/2006   Irritable bowel syndrome 11/03/2006   OSTEOARTHRITIS 11/03/2006   POLYMYALGIA RHEUMATICA 11/03/2006   PERIPHERAL NEUROPATHY 12/21/2000   COLITIS, ULCERATIVE NOS 08/23/1993   PCP:  Patient, No Pcp Per Pharmacy:   [id number] [place] [place] [address]Phone: [phone]: [phone]    Social Determinants of Health (SDOH) Interventions    Readmission Risk Interventions     No data to display

## 2022-03-12 NOTE — Consult Note (Signed)
ORTHOPAEDIC CONSULTATION  REQUESTING PHYSICIAN: Kathie Dike, MD  PCP:  Patient, No Pcp Per  Chief Complaint: right hip injury  HPI: Suzanne Monroe is a 88 y.o. female  with a history of dementia, hypertension, anxiety, depression, eczema who lives at [facility] and presented yesterday by EMS after an unwitnessed fall in the bathroom.  When she was found she was complaining of pain in her right hip and was unable to stand or ambulate.  She denies hitting her head or loss of consciousness.  She denies taking any anticoagulation. She denies any pain in her left leg, arms, chest or abdomen.  She denies any nausea vomiting, shortness of breath or diarrhea. Patient's daughter does help provide health history. X-rays in the ED showed a displaced right femoral neck fracture. She was admitted by [facility] for medical risk stratification and perioperative optimization. I was asked by Dr. Tamera Punt to assume care due to my expertise in adult reconstruction.   Past Medical History:  Diagnosis Date   Anxiety    Arthritis    OA   Cerumen impaction    recurrent Lt   Depression    Dry skin    severe dry skin/ichthyosis( Uses tanner understands risk)   Hypertension    Macular degeneration    Migraine    Hx of   Peripheral neuropathy    Past Surgical History:  Procedure Laterality Date   APPENDECTOMY     CHOLECYSTECTOMY     RHINOPLASTY     SHOULDER SURGERY     left shoulder replacement   Social History   Socioeconomic History   Marital status: Widowed    Spouse name: Not on file   Number of children: Not on file   Years of education: Not on file   Highest education level: Not on file  Occupational History   Not on file  Tobacco Use   Smoking status: Former    Types: Cigarettes    Quit date: 08/24/1983    Years since quitting: 38.5   Smokeless tobacco: Never  Substance and Sexual Activity   Alcohol use: No   Drug use: No   Sexual activity: Not on file  Other Topics Concern    Not on file  Social History Narrative   Not on file   Social Determinants of Health   Financial Resource Strain: Not on file  Food Insecurity: Not on file  Transportation Needs: Not on file  Physical Activity: Not on file  Stress: Not on file  Social Connections: Not on file   Family History  Problem Relation Age of Onset   Stroke Mother    Hypertension Mother    Alcohol abuse Father    Heart disease Father    Kidney disease Father        dialysis   Hypertension Brother    Hypertension Daughter    Cancer Maternal Aunt        colon CA   Cancer Maternal Uncle        liver CA   Allergies  Allergen Reactions   Ampicillin     REACTION: reaction not known   Cefaclor     REACTION: diarrhea   Codeine     REACTION: diarrhea   Duloxetine     REACTION: severe diarrhea   Fexofenadine     REACTION: fuzzy   Ibuprofen     REACTION: GI   Loratadine     REACTION: blurred vision   Nsaids  REACTION: fatigue   Prior to Admission medications   Medication Sig Start Date End Date Taking? Authorizing Provider  acetaminophen (TYLENOL) 500 MG tablet Take 1 tablet (500 mg total) by mouth every 6 (six) hours as needed for moderate pain. 02/11/21  Yes Thurnell Lose, MD  amLODipine (NORVASC) 10 MG tablet Take 1 tablet (10 mg total) by mouth daily. 02/11/21  Yes Thurnell Lose, MD  Calcium Carbonate-Vitamin D 600-400 MG-UNIT tablet Take 1 tablet by mouth daily. 02/11/21  Yes Thurnell Lose, MD  divalproex (DEPAKOTE SPRINKLE) 125 MG capsule Take 125 mg by mouth 3 (three) times daily.   Yes [provider]  donepezil (ARICEPT) 5 MG tablet Take 1 tablet (5 mg total) by mouth at bedtime. 02/11/21  Yes Thurnell Lose, MD  Emollient (AQUAPHOR ADV PROTECT HEALING) 41 % OINT Apply 1 Application topically daily.   Yes [provider]  furosemide (LASIX) 20 MG tablet Take 1 tablet (20 mg total) by mouth every other day. 02/11/21  Yes Thurnell Lose, MD  guaifenesin  (ROBITUSSIN) 100 MG/5ML syrup Take 10 mLs (200 mg total) by mouth every 6 (six) hours as needed for cough. 02/11/21  Yes Thurnell Lose, MD  haloperidol (HALDOL) 5 MG tablet Take 5 mg by mouth daily as needed for agitation.   Yes [provider]  lisinopril (ZESTRIL) 40 MG tablet Take 40 mg by mouth daily.   Yes [provider]  loperamide (IMODIUM A-D) 2 MG tablet Take 2 tablets (4 mg total) by mouth in the morning and at bedtime. 02/11/21  Yes Thurnell Lose, MD  magnesium hydroxide (MILK OF MAGNESIA) 400 MG/5ML suspension Take 30 mLs by mouth at bedtime as needed for mild constipation. 02/11/21  Yes Thurnell Lose, MD  melatonin 3 MG TABS tablet Take 1 tablet (3 mg total) by mouth at bedtime. 02/11/21  Yes Thurnell Lose, MD  neomycin-bacitracin-polymyxin (NEOSPORIN) [id number] ointment Apply 1 application topically daily as needed (minor skin tears/ abrasions). 02/11/21  Yes Thurnell Lose, MD  Nutritional Supplements (ENSURE ORIGINAL) LIQD Take 237 mLs by mouth 3 (three) times daily. 02/11/21  Yes Thurnell Lose, MD  PARoxetine (PAXIL) 20 MG tablet TAKE 1/2 TABLET BY MOUTH EVERY MORNING AND 1/2 TABLET EACH EVENING Patient taking differently: Take 20 mg by mouth daily. 02/11/21  Yes Thurnell Lose, MD  propranolol (INDERAL) 60 MG tablet Take 2 tablets (120 mg total) by mouth 2 (two) times daily. TAKE 2 TABLETS (120 MG TOTAL) BY MOUTH 2 (TWO) TIMES DAILY. 02/11/21  Yes Thurnell Lose, MD  Psyllium Husk 100 % POWD Take 1 packet by mouth daily as needed (For diarrhea).   Yes [provider]  QUEtiapine (SEROQUEL) 25 MG tablet Take 1 tablet (25 mg total) by mouth at bedtime. 02/11/21  Yes Thurnell Lose, MD  Skin Protectants, Misc. (MINERIN CREME) CREA Apply 1 application topically as needed (dry skin). 02/11/21  Yes Thurnell Lose, MD  zinc oxide 20 % ointment Apply 1 Application topically daily.   Yes [provider]   DG Knee Right  Port  Result Date: 03/12/2022 CLINICAL DATA:  Right femur fracture. EXAM: PORTABLE RIGHT KNEE - 1-2 VIEW COMPARISON:  Right knee x-rays dated June 27, 2008. FINDINGS: No evidence of fracture, dislocation, or joint effusion. No evidence of arthropathy or other focal bone abnormality. Soft tissues are unremarkable. IMPRESSION: Negative. Electronically Signed   By: Titus Dubin M.D.   On: 03/12/2022 09:03  DG Hip Unilat With Pelvis 2-3 Views Right  Result Date: 03/11/2022 CLINICAL DATA:  Fall EXAM: DG HIP (WITH OR WITHOUT PELVIS) 2-3V RIGHT COMPARISON:  No prior hip radiographs, correlation is made with CT abdomen pelvis 02/06/2021 FINDINGS: Impacted, foreshortened fracture of the right femoral neck. No other fracture is seen in the pelvis or left hip degenerative changes in the lumbar spine. Nonobstructive bowel-gas pattern. IMPRESSION: Right femoral neck fracture. Electronically Signed   By: Merilyn Baba M.D.   On: 03/11/2022 18:27   DG Chest 1 View  Result Date: 03/11/2022 CLINICAL DATA:  Un witnessed fall, right hip pain EXAM: CHEST  1 VIEW COMPARISON:  02/09/2021 FINDINGS: Single frontal view of the chest demonstrates an unremarkable cardiac silhouette. No airspace disease, effusion, or pneumothorax. Left shoulder arthroplasty. No acute fractures. IMPRESSION: 1. No acute intrathoracic process. Electronically Signed   By: Randa Ngo M.D.   On: 03/11/2022 18:26    Positive ROS: All other systems have been reviewed and were otherwise negative with the exception of those mentioned in the HPI and as above.  Physical Exam: General: Alert, no acute distress Cardiovascular: No pedal edema Respiratory: No cyanosis, no use of accessory musculature GI: No organomegaly, abdomen is soft and non-tender Skin: No lesions in the area of chief complaint Neurologic: Sensation intact distally Psychiatric: Confused Lymphatic: No axillary or cervical lymphadenopathy  MUSCULOSKELETAL:  No open wounds  or abrasions about the right hip or thigh. Tender to palpation about the right anterolateral thigh and hip region. Noncompliant with PE for me.   Per previous notes: Able to dorsiflex and plantar flex BLE without difficulty. Calves soft and nontender. Distally neurovascularly intact.    Assessment: Displaced right femoral neck fracture Dementia  Plan: I discussed the findings with the patient's daughter, Suzanne Monroe. I recommend R hip hemiarthroplasty for pain control and immediate mobilization out of bed with PT. We discussed the R/B/A. Plan for surgery today. Cont NPO.  The risks, benefits, and alternatives were discussed with the patient's daughter. There are risks associated with the surgery including, but not limited to, problems with anesthesia (death), infection, instability (giving out of the joint), dislocation, differences in leg length/angulation/rotation, fracture of bones, loosening or failure of implants, hematoma (blood accumulation) which may require surgical drainage, blood clots, pulmonary embolism, nerve injury (foot drop and lateral thigh numbness), and blood vessel injury. The patient's daughter understands these risks and elects to proceed.   Bertram Savin, MD [phone]    03/12/2022 2:53 PM

## 2022-03-12 NOTE — Anesthesia Procedure Notes (Signed)
Procedure Name: Intubation Date/Time: 03/12/2022 3:49 PM  Performed by: Lind Covert, CRNAPre-anesthesia Checklist: Patient identified, Emergency Drugs available, Suction available, Patient being monitored and Timeout performed Patient Re-evaluated:Patient Re-evaluated prior to induction Oxygen Delivery Method: Circle system utilized Preoxygenation: Pre-oxygenation with 100% oxygen Induction Type: IV induction Ventilation: Mask ventilation without difficulty Laryngoscope Size: Mac and 3 Grade View: Grade III Tube type: Oral Tube size: 7.0 mm Number of attempts: 1 Airway Equipment and Method: Stylet Placement Confirmation: ETT inserted through vocal cords under direct vision, breath sounds checked- equal and bilateral and positive ETCO2 Secured at: 22 cm Tube secured with: Tape Dental Injury: Teeth and Oropharynx as per pre-operative assessment  Difficulty Due To: Difficult Airway- due to anterior larynx and Difficult Airway- due to immobile epiglottis

## 2022-03-12 NOTE — Progress Notes (Signed)
Initial Nutrition Assessment  DOCUMENTATION CODES:   Non-severe (moderate) malnutrition in context of chronic illness  INTERVENTION:   -Ensure Plus High Protein po BID, each supplement provides 350 kcal and 20 grams of protein.   -Multivitamin with minerals daily  NUTRITION DIAGNOSIS:   Moderate Malnutrition related to chronic illness (dementia) as evidenced by mild fat depletion, mild muscle depletion.  GOAL:   Patient will meet greater than or equal to 90% of their needs  MONITOR:   PO intake, Supplement acceptance, Weight trends, I & O's  REASON FOR ASSESSMENT:   Consult Hip fracture protocol  ASSESSMENT:   88 y.o. female with medical history significant of HTN, dementia. Pt had unwitnessed fall in bathroom.Found to have R hip fx.  Patient currently alert/oriented x 1. Attempts to answer RD's questions. Reports she eats well PTA, good appetite, likes Ensure supplements. Denies issues with chewing or swallowing.  Currently NPO, awaiting hip surgery this afternoon.  Will order Ensure once diet is advanced.  Per weight records, no weight loss noted. Could not tell RD her UBW.  Medications: OSCAL w/ D  Labs reviewed.  NUTRITION - FOCUSED PHYSICAL EXAM:  Flowsheet Row Most Recent Value  Orbital Region Moderate depletion  Upper Arm Region Mild depletion  Thoracic and Lumbar Region Unable to assess  Buccal Region Mild depletion  Temple Region Mild depletion  Clavicle Bone Region Mild depletion  Clavicle and Acromion Bone Region Mild depletion  Scapular Bone Region Mild depletion  Dorsal Hand Mild depletion  Patellar Region Unable to assess  Anterior Thigh Region Unable to assess  Posterior Calf Region Unable to assess  Edema (RD Assessment) None  Hair Reviewed  Eyes Reviewed  Mouth Reviewed  Skin Reviewed  Nails Unable to assess  [painted]       Diet Order:   Diet Order             Diet NPO time specified  Diet effective now                    EDUCATION NEEDS:   Not appropriate for education at this time  Skin:  Skin Assessment: Reviewed RN Assessment  Last BM:  PTA  Height:   Ht Readings from Last 1 Encounters:  03/12/22 '5\' 3"'$  (1.6 m)    Weight:   Wt Readings from Last 1 Encounters:  03/12/22 59.4 kg    BMI:  Body mass index is 23.2 kg/m.  Estimated Nutritional Needs:   Kcal:  1500-1700  Protein:  70-80g  Fluid:  1.7L/day  Clayton Bibles, MS, RD, LDN Inpatient Clinical Dietitian Contact information available via Amion

## 2022-03-12 NOTE — Anesthesia Preprocedure Evaluation (Signed)
Anesthesia Evaluation  Patient identified by MRN, date of birth, ID band Patient confused    Reviewed: NPO status , Patient's Chart, lab work & pertinent test results, Unable to perform ROS - Chart review only  Airway Mallampati: II  TM Distance: >3 FB Neck ROM: Full    Dental no notable dental hx.    Pulmonary neg pulmonary ROS, former smoker,    Pulmonary exam normal breath sounds clear to auscultation       Cardiovascular hypertension, Pt. on medications Normal cardiovascular exam Rhythm:Regular Rate:Normal     Neuro/Psych Anxiety Depression Dementia negative neurological ROS     GI/Hepatic Neg liver ROS, PUD,   Endo/Other  negative endocrine ROS  Renal/GU negative Renal ROS  negative genitourinary   Musculoskeletal  (+) Arthritis , Osteoarthritis,    Abdominal   Peds negative pediatric ROS (+)  Hematology negative hematology ROS (+)   Anesthesia Other Findings   Reproductive/Obstetrics negative OB ROS                             Anesthesia Physical Anesthesia Plan  ASA: 3  Anesthesia Plan: General   Post-op Pain Management: Ofirmev IV (intra-op)*   Induction: Intravenous  PONV Risk Score and Plan: 3 and Ondansetron, Dexamethasone and Treatment may vary due to age or medical condition  Airway Management Planned: Oral ETT  Additional Equipment:   Intra-op Plan:   Post-operative Plan: Extubation in OR  Informed Consent: I have reviewed the patients History and Physical, chart, labs and discussed the procedure including the risks, benefits and alternatives for the proposed anesthesia with the patient or authorized representative who has indicated his/her understanding and acceptance.     Dental advisory given  Plan Discussed with: CRNA and Surgeon  Anesthesia Plan Comments:         Anesthesia Quick Evaluation

## 2022-03-12 NOTE — Transfer of Care (Signed)
Immediate Anesthesia Transfer of Care Note  Patient: Suzanne Monroe  Procedure(s) Performed: TOTAL HIP ARTHROPLASTY ANTERIOR APPROACH (Right: Hip)  Patient Location: PACU  Anesthesia Type:General  Level of Consciousness: sedated  Airway & Oxygen Therapy: Patient Spontanous Breathing and Patient connected to face mask oxygen  Post-op Assessment: Report given to RN and Post -op Vital signs reviewed and stable  Post vital signs: Reviewed and stable  Last Vitals:  Vitals Value Taken Time  BP 149/60 03/12/22 1730  Temp    Pulse 52 03/12/22 1734  Resp 17 03/12/22 1734  SpO2 97 % 03/12/22 1734  Vitals shown include unvalidated device data.  Last Pain:  Vitals:   03/12/22 0630  TempSrc:   PainSc: Asleep         Complications: No notable events documented.

## 2022-03-12 NOTE — Op Note (Signed)
OPERATIVE REPORT  SURGEON: Rod Can, MD   ASSISTANT: Larene Pickett, Pa-C  PREOPERATIVE DIAGNOSIS: Displaced Right femoral neck fracture.   POSTOPERATIVE DIAGNOSIS: Displaced Right femoral neck fracture.   PROCEDURE: Right hip hemiarthroplasty, anterior approach.   IMPLANTS: Biomet Taperloc Complete Reduced Distal stem, size 12 x 144 mm, standard offset, with a 26 - 6 mm metal modular taper adapter and a 45 mm bipolar head ball.  ANESTHESIA:  General  ANTIBIOTICS: 2g ancef.  ESTIMATED BLOOD LOSS: 100 mL    DRAINS: None.  COMPLICATIONS: None   CONDITION: PACU - hemodynamically stable.   BRIEF CLINICAL NOTE: Suzanne Monroe is a 88 y.o. female with a displaced Right femoral neck fracture. The patient was admitted to the hospitalist service and underwent perioperative risk stratification and medical optimization. The risks, benefits, and alternatives to hemiarthroplasty were explained, and the patient elected to proceed.  PROCEDURE IN DETAIL: The patient was taken to the operating room and general anesthesia was induced on the hospital bed.  The patient was then positioned on the Hana table.  All bony prominences were well padded.  The hip was prepped and draped in the normal sterile surgical fashion.  A time-out was called verifying side and site of surgery. Antibiotics were given within 60 minutes of beginning the procedure.   Bikini incision was made, and the direct anterior approach to the hip was performed through the Hueter interval.  Superficial dissection was carried out lateral to the ASIS. Lateral femoral circumflex vessels were treated with the Auqumantys. The anterior capsule was exposed and an inverted T capsulotomy was made. Fracture hematoma was encountered and evacuated. The patient was found to have a comminuted Right subcapital femoral neck fracture.  Inferior pubofemoral ligament was released subperiosteally to the lesser trochanter. I freshened the femoral neck cut with  a saw.  I removed the femoral neck fragment.  A corkscrew was placed into the head and the head was removed.  This was passed to the back table and was measured.   Acetabular exposure was achieved.  I examined the articular cartilage which was intact.  The labrum was intact. A 45 mm trial head was placed and found to have excellent fit.   I then gained femoral exposure taking care to protect the abductors and greater trochanter.  The superior capsule was incised longitudinally, staying lateral to the posterior border of the femoral neck. External rotation, extension, and adduction were applied.  A cookie cutter was used to enter the femoral canal, and then the femoral canal finder was used to confirm location.  I then sequentially broached up to a size 12.  Calcar planer was used on the femoral neck remnant.  I placed a standard offset neck and a bipolar trial head ball. The hip was reduced.  Leg lengths were checked fluoroscopically.  The hip was dislocated and trial components were removed.  I placed the real stem followed by the real bipolar construct.  A single reduction maneuver was performed and the hip was reduced.  Fluoroscopy was used to confirm component position and leg lengths.  At 90 degrees of external rotation and extension, the hip was stable to an anterior directed force.   The wound was copiously irrigated with Irrisept solution and normal saline using pulse lavage.  Marcaine solution was injected into the periarticular soft tissue.  The wound was closed in layers using #1 Stratafix for the fascia, 2-0 Vicryl for the subcutaneous fat, 2-0 Monocryl for the deep dermal layer, and staples +  Dermabond for the skin.  Once the glue was fully dried, an Aquacell Ag dressing was applied.  The patient was then awakened from anesthesia and transported to the recovery room in stable condition.  Sponge, needle, and instrument counts were correct at the end of the case x2.  The patient tolerated the  procedure well and there were no known complications.  Please note that a surgical assistant was a medical necessity for this procedure to perform it in a safe and expeditious manner. Assistant was necessary to provide appropriate retraction of vital neurovascular structures, to prevent femoral fracture, and to allow for anatomic placement of the prosthesis.

## 2022-03-12 NOTE — Discharge Instructions (Signed)
? ?Dr. Kalisi Bevill ?Joint Replacement Specialist ?[facility] ?[address] ?[place], [place] [address] ?[phone] ? ? ?TOTAL HIP REPLACEMENT POSTOPERATIVE DIRECTIONS ? ? ? ?Hip Rehabilitation, Guidelines Following Surgery  ? ?WEIGHT BEARING ?Weight bearing as tolerated with assist device (walker, cane, etc) as directed, use it as long as suggested by your surgeon or therapist, typically at least 4-6 weeks. ? ?The results of a hip operation are greatly improved after range of motion and muscle strengthening exercises. Follow all safety measures which are given to protect your hip. If any of these exercises cause increased pain or swelling in your joint, decrease the amount until you are comfortable again. Then slowly increase the exercises. Call your caregiver if you have problems or questions.  ? ?HOME CARE INSTRUCTIONS  ?Most of the following instructions are designed to prevent the dislocation of your new hip.  ?Remove items at home which could result in a fall. This includes throw rugs or furniture in walking pathways.  ?Continue medications as instructed at time of discharge. ?You may have some home medications which will be placed on hold until you complete the course of blood thinner medication. ?You may start showering once you are discharged home. Do not remove your dressing. ?Do not put on socks or shoes without following the instructions of your caregivers.   ?Sit on chairs with arms. Use the chair arms to help push yourself up when arising.  ?Arrange for the use of a toilet seat elevator so you are not sitting low.  ?Walk with walker as instructed.  ?You may resume a sexual relationship in one month or when given the OK by your caregiver.  ?Use walker as long as suggested by your caregivers.  ?You may put full weight on your legs and walk as much as is comfortable. ?Avoid periods of inactivity such as sitting longer than an hour when not asleep. This helps prevent blood  clots.  ?You may return to work once you are cleared by your surgeon.  ?Do not drive a car for 6 weeks or until released by your surgeon.  ?Do not drive while taking narcotics.  ?Wear elastic stockings for two weeks following surgery during the day but you may remove then at night.  ?Make sure you keep all of your appointments after your operation with all of your doctors and caregivers. You should call the office at the above phone number and make an appointment for approximately two weeks after the date of your surgery. ?Please pick up a stool softener and laxative for home use as long as you are requiring pain medications. ?ICE to the affected hip every three hours for 30 minutes at a time and then as needed for pain and swelling. Continue to use ice on the hip for pain and swelling from surgery. You may notice swelling that will progress down to the foot and ankle.  This is normal after surgery.  Elevate the leg when you are not up walking on it.   ?It is important for you to complete the blood thinner medication as prescribed by your doctor. ?Continue to use the breathing machine which will help keep your temperature down.  It is common for your temperature to cycle up and down following surgery, especially at night when you are not up moving around and exerting yourself.  The breathing machine keeps your lungs expanded and your temperature down. ? ?RANGE OF MOTION AND STRENGTHENING EXERCISES  ?These exercises are designed to help you   keep full movement of your hip joint. Follow your caregiver's or physical therapist's instructions. Perform all exercises about fifteen times, three times per day or as directed. Exercise both hips, even if you have had only one joint replacement. These exercises can be done on a training (exercise) mat, on the floor, on a table or on a bed. Use whatever works the best and is most comfortable for you. Use music or television while you are exercising so that the exercises are a  pleasant break in your day. This will make your life better with the exercises acting as a break in routine you can look forward to.  ?Lying on your back, slowly slide your foot toward your buttocks, raising your knee up off the floor. Then slowly slide your foot back down until your leg is straight again.  ?Lying on your back spread your legs as far apart as you can without causing discomfort.  ?Lying on your side, raise your upper leg and foot straight up from the floor as far as is comfortable. Slowly lower the leg and repeat.  ?Lying on your back, tighten up the muscle in the front of your thigh (quadriceps muscles). You can do this by keeping your leg straight and trying to raise your heel off the floor. This helps strengthen the largest muscle supporting your knee.  ?Lying on your back, tighten up the muscles of your buttocks both with the legs straight and with the knee bent at a comfortable angle while keeping your heel on the floor.  ? ?SKILLED REHAB INSTRUCTIONS: ?If the patient is transferred to a skilled rehab facility following release from the hospital, a list of the current medications will be sent to the facility for the patient to continue.  When discharged from the skilled rehab facility, please have the facility set up the patient's Home Health Physical Therapy prior to being released. Also, the skilled facility will be responsible for providing the patient with their medications at time of release from the facility to include their pain medication and their blood thinner medication. If the patient is still at the rehab facility at time of the two week follow up appointment, the skilled rehab facility will also need to assist the patient in arranging follow up appointment in our office and any transportation needs. ? ?POST-OPERATIVE OPIOID TAPER INSTRUCTIONS: ?It is important to wean off of your opioid medication as soon as possible. If you do not need pain medication after your surgery it is ok  to stop day one. ?Opioids include: ?Codeine, Hydrocodone(Norco, Vicodin), Oxycodone(Percocet, oxycontin) and hydromorphone amongst others.  ?Long term and even short term use of opiods can cause: ?Increased pain response ?Dependence ?Constipation ?Depression ?Respiratory depression ?And more.  ?Withdrawal symptoms can include ?Flu like symptoms ?Nausea, vomiting ?And more ?Techniques to manage these symptoms ?Hydrate well ?Eat regular healthy meals ?Stay active ?Use relaxation techniques(deep breathing, meditating, yoga) ?Do Not substitute Alcohol to help with tapering ?If you have been on opioids for less than two weeks and do not have pain than it is ok to stop all together.  ?Plan to wean off of opioids ?This plan should start within one week post op of your joint replacement. ?Maintain the same interval or time between taking each dose and first decrease the dose.  ?Cut the total daily intake of opioids by one tablet each day ?Next start to increase the time between doses. ?The last dose that should be eliminated is the evening dose.  ? ? ?MAKE   SURE YOU:  ?Understand these instructions.  ?Will watch your condition.  ?Will get help right away if you are not doing well or get worse. ? ?Pick up stool softner and laxative for home use following surgery while on pain medications. ?Do not remove your dressing. ?The dressing is waterproof--it is OK to take showers. ?Continue to use ice for pain and swelling after surgery. ?Do not use any lotions or creams on the incision until instructed by your surgeon. ?Total Hip Protocol. ? ?

## 2022-03-13 DIAGNOSIS — F0390 Unspecified dementia without behavioral disturbance: Secondary | ICD-10-CM | POA: Diagnosis not present

## 2022-03-13 DIAGNOSIS — E440 Moderate protein-calorie malnutrition: Secondary | ICD-10-CM

## 2022-03-13 DIAGNOSIS — I10 Essential (primary) hypertension: Secondary | ICD-10-CM | POA: Diagnosis not present

## 2022-03-13 DIAGNOSIS — S72001A Fracture of unspecified part of neck of right femur, initial encounter for closed fracture: Secondary | ICD-10-CM | POA: Diagnosis not present

## 2022-03-13 NOTE — Progress Notes (Signed)
OT Cancellation Note  Patient Details Name: Suzanne Monroe MRN: [id number] DOB: 1932/09/08   Cancelled Treatment:    Reason Eval/Treat Not Completed: Patient declined, no reason specified Patient declined to participate in session reporting it was too cold. Patient was very short with answers. Not cooperative at this time. OT to continue to follow and check back as schedule willl allow.  Jackelyn Poling OTR/L, [facility] Office# [phone] Pager# [phone]  03/13/2022, 1:07 PM

## 2022-03-13 NOTE — Progress Notes (Signed)
   Subjective:  Patient reports pain as n/a.  She is very confused, and at her baseline.    Objective:   VITALS:   Vitals:   03/12/22 1830 03/12/22 2141 03/13/22 0119 03/13/22 0552  BP: 140/62 (!) 125/57 (!) 108/57 96/64  Pulse: (!) 56 (!) 52 (!) 50 (!) 56  Resp:  '13 15 14  '$ Temp: 98.8 F (37.1 C) 97.8 F (36.6 C) 98.4 F (36.9 C) 97.7 F (36.5 C)  TempSrc:  Oral Oral Oral  SpO2: 97% 92% 96% 97%  Weight:      Height:        Neurologically intact ABD soft Dorsiflexion/Plantar flexion intact Incision: dressing C/D/I Compartment soft   Lab Results  Component Value Date   WBC 14.6 (H) 03/12/2022   HGB 13.2 03/12/2022   HCT 40.1 03/12/2022   MCV 93.9 03/12/2022   PLT 173 03/12/2022   BMET    Component Value Date/Time   NA 141 03/12/2022 0114   K 4.2 03/12/2022 0114   CL 109 03/12/2022 0114   CO2 22 03/12/2022 0114   GLUCOSE 140 (H) 03/12/2022 0114   BUN 15 03/12/2022 0114   CREATININE 0.83 03/12/2022 1925   CALCIUM 9.3 03/12/2022 0114   GFRNONAA >60 03/12/2022 1925     Assessment/Plan: 1 Day Post-Op   Principal Problem:   Closed right hip fracture, initial encounter ([place]) Active Problems:   Essential hypertension   Dementia without behavioral disturbance (H[facility])   Acute encephalopathy   Malnutrition of moderate degree   Advance diet Up with therapy WBAT Ok to resume lovenox.  Nicholes Stairs 03/13/2022, 8:11 AM   Geralynn Rile, MD [phone]

## 2022-03-13 NOTE — Evaluation (Signed)
Physical Therapy Evaluation Patient Details Name: Suzanne Monroe MRN: [id number] DOB: [birth year] Today's Date: 03/13/2022  History of Present Illness  88 year old female with history of dementia, admitted to the hospital after an unwitnessed fall.  Pt was found to have right-sided hip fracture s/p right hip hemiarthroplasty anterior approach on 03/12/22.  Clinical Impression  Patient is s/p above surgery resulting in functional limitations due to the deficits listed below (see PT Problem List).  Patient will benefit from skilled PT to increase their independence and safety with mobility to allow discharge to the venue listed below.  Pt not able to recall fall or surgery however aware she was in hospital.  Pt from ALF and daughter plans on d/c to SNF prior to return to ALF.        Recommendations for follow up therapy are one component of a multi-disciplinary discharge planning process, led by the attending physician.  Recommendations may be updated based on patient status, additional functional criteria and insurance authorization.  Follow Up Recommendations Skilled nursing-short term rehab (<3 hours/day) Can patient physically be transported by private vehicle: No    Assistance Recommended at Discharge Frequent or constant Supervision/Assistance  Patient can return home with the following  A lot of help with walking and/or transfers;A lot of help with bathing/dressing/bathroom;Assist for transportation    Equipment Recommendations None recommended by PT  Recommendations for Other Services       Functional Status Assessment Patient has had a recent decline in their functional status and demonstrates the ability to make significant improvements in function in a reasonable and predictable amount of time.     Precautions / Restrictions Precautions Precautions: Fall Restrictions Weight Bearing Restrictions: No RLE Weight Bearing: Weight bearing as tolerated      Mobility  Bed  Mobility Overal bed mobility: Needs Assistance Bed Mobility: Supine to Sit     Supine to sit: Max assist, HOB elevated     General bed mobility comments: required assist for lower body due to pain, pt attempted to assist with upper body holding bed rail but still required assist for trunk upright    Transfers Overall transfer level: Needs assistance Equipment used: Rolling walker (2 wheels) Transfers: Sit to/from Stand, Bed to chair/wheelchair/BSC Sit to Stand: Mod assist Stand pivot transfers: Min assist         General transfer comment: verbal cues for UE and LE positioning, assist to rise and steady, cues for pivot over to recliner, assist for stability    Ambulation/Gait                  Stairs            Wheelchair Mobility    Modified Rankin (Stroke Patients Only)       Balance Overall balance assessment: History of Falls (pt unable to recall fall or surgery, hx dementia)                                           Pertinent Vitals/Pain Pain Assessment Pain Assessment: PAINAD Breathing: normal Negative Vocalization: none Facial Expression: smiling or inexpressive Body Language: relaxed Consolability: no need to console PAINAD Score: 0 Pain Intervention(s): Repositioned, Monitored during session    Home Living Family/patient expects to be discharged to:: Assisted living                 Home Equipment: Conservation officer, nature (  2 wheels)      Prior Function Prior Level of Function : Needs assist  Cognitive Assist : Mobility (cognitive)           Mobility Comments: minimal ambulation at baseline per daughter, from memory care ALF       Hand Dominance        Extremity/Trunk Assessment        Lower Extremity Assessment Lower Extremity Assessment: RLE deficits/detail RLE Deficits / Details: required assist due to pain with movement       Communication      Cognition Arousal/Alertness: Awake/alert Behavior  During Therapy: Flat affect Overall Cognitive Status: History of cognitive impairments - at baseline                                          General Comments      Exercises     Assessment/Plan    PT Assessment Patient needs continued PT services  PT Problem List Decreased strength;Decreased activity tolerance;Decreased balance;Decreased mobility;Decreased knowledge of precautions;Decreased safety awareness;Decreased knowledge of use of DME;Pain       PT Treatment Interventions DME instruction;Gait training;Balance training;Therapeutic exercise;Wheelchair mobility training;Functional mobility training;Therapeutic activities;Patient/family education    PT Goals (Current goals can be found in the Care Plan section)  Acute Rehab PT Goals PT Goal Formulation: With patient/family Time For Goal Achievement: 03/27/22 Potential to Achieve Goals: Fair    Frequency Min 2X/week     Co-evaluation               AM-PAC PT "6 Clicks" Mobility  Outcome Measure Help needed turning from your back to your side while in a flat bed without using bedrails?: A Lot Help needed moving from lying on your back to sitting on the side of a flat bed without using bedrails?: A Lot Help needed moving to and from a bed to a chair (including a wheelchair)?: Total Help needed standing up from a chair using your arms (e.g., wheelchair or bedside chair)?: Total Help needed to walk in hospital room?: Total Help needed climbing 3-5 steps with a railing? : Total 6 Click Score: 8    End of Session Equipment Utilized During Treatment: Gait belt Activity Tolerance: Patient tolerated treatment well Patient left: in chair;with call bell/phone within reach;with family/visitor present;with nursing/sitter in room;with chair alarm set Nurse Communication: Mobility status PT Visit Diagnosis: Other abnormalities of gait and mobility (R26.89);Pain Pain - Right/Left: Right Pain - part of body:  Hip    Time: 0076-2263 PT Time Calculation (min) (ACUTE ONLY): 16 min   Charges:   PT Evaluation $PT Eval Low Complexity: 1 Low         Kati PT, DPT Physical Therapist Acute Rehabilitation Services Preferred contact method: Secure Chat Weekend Pager Only: [phone] Office: [place] 03/13/2022, 11:51 AM

## 2022-03-13 NOTE — Progress Notes (Signed)
PROGRESS NOTE    Suzanne Monroe  GEZ:[id number] DOB: 1933/07/30 DOA: 03/11/2022 PCP: Patient, No Pcp Per    Brief Narrative:  88 year old female with history of dementia, admitted to the hospital after an unwitnessed fall.  Found to have right-sided hip fracture.  Admitted for operative management.   Assessment & Plan:   Principal Problem:   Closed right hip fracture, initial encounter ([place]) Active Problems:   Dementia without behavioral disturbance (HCC)   Acute encephalopathy   Essential hypertension   Malnutrition of moderate degree  Right hip fracture -Status post operative management -Continue pain management -PT/OT -originally from ALF, but will need SNF placement on discharge -lovenox for DVT prophylaxis, per ortho  Dementia -Suspect worsening delirium related to hip fracture/hospital appointment -Continue on Aricept -Continue Seroquel, Depakote  Hypertension -Continue home medications -Hold beta-blocker in light of bradycardia   DVT prophylaxis: enoxaparin (LOVENOX) injection 30 mg Start: 03/13/22 0800 SCDs Start: 03/12/22 1844  Code Status: Full code Family Communication: No family present Disposition Plan: Status is: Inpatient Remains inpatient appropriate because: Postop care, will likely need SNF placement     Consultants:  Orthopedics  Procedures:  Right hip hemiarthroplasty, anterior approach.   Antimicrobials:      Subjective: She is sitting up in chair. Complains of pain in her leg. She is confused  Objective: Vitals:   03/12/22 2141 03/13/22 0119 03/13/22 0552 03/13/22 1018  BP: (!) 125/57 (!) 108/57 96/64 (!) 127/58  Pulse: (!) 52 (!) 50 (!) 56 (!) 56  Resp: '13 15 14   '$ Temp: 97.8 F (36.6 C) 98.4 F (36.9 C) 97.7 F (36.5 C) 97.7 F (36.5 C)  TempSrc: Oral Oral Oral   SpO2: 92% 96% 97% 94%  Weight:      Height:        Intake/Output Summary (Last 24 hours) at 03/13/2022 1255 Last data filed at 03/13/2022 6503 Gross per 24  hour  Intake 700 ml  Output 950 ml  Net -250 ml   Filed Weights   03/12/22 0100  Weight: 59.4 kg    Examination:  General exam: sitting up in chair, no distress Respiratory system: Clear to auscultation. Respiratory effort normal. Cardiovascular system: S1 & S2 heard, RRR. No JVD, murmurs, rubs, gallops or clicks. No pedal edema. Gastrointestinal system: Abdomen is nondistended, soft and nontender. No organomegaly or masses felt. Normal bowel sounds heard. Central nervous system:  No focal neurological deficits. Extremities: Symmetric 5 x 5 power. Skin: No rashes, lesions or ulcers Psychiatry: confused    Data Reviewed: I have personally reviewed following labs and imaging studies  CBC: Recent Labs  Lab 03/11/22 1755 03/12/22 2111  WBC 12.8* 14.6*  NEUTROABS 9.8*  --   HGB 13.9 13.2  HCT 40.6 40.1  MCV 91.9 93.9  PLT 192 546   Basic Metabolic Panel: Recent Labs  Lab 03/11/22 1755 03/12/22 0114 03/12/22 1925  NA 142 141  --   K 3.7 4.2  --   CL 111 109  --   CO2 23 22  --   GLUCOSE 125* 140*  --   BUN 16 15  --   CREATININE 0.99 0.95 0.83  CALCIUM 9.0 9.3  --   MG  --  1.9  --    GFR: Estimated Creatinine Clearance: 39.5 mL/min (by C-G formula based on SCr of 0.83 mg/dL). Liver Function Tests: No results for input(s): "AST", "ALT", "ALKPHOS", "BILITOT", "PROT", "ALBUMIN" in the last 168 hours. No results for input(s): "LIPASE", "AMYLASE"  in the last 168 hours. No results for input(s): "AMMONIA" in the last 168 hours. Coagulation Profile: Recent Labs  Lab 03/11/22 1755  INR 1.0   Cardiac Enzymes: No results for input(s): "CKTOTAL", "CKMB", "CKMBINDEX", "TROPONINI" in the last 168 hours. BNP (last 3 results) No results for input(s): "PROBNP" in the last 8760 hours. HbA1C: No results for input(s): "HGBA1C" in the last 72 hours. CBG: No results for input(s): "GLUCAP" in the last 168 hours. Lipid Profile: No results for input(s): "CHOL", "HDL",  "LDLCALC", "TRIG", "CHOLHDL", "LDLDIRECT" in the last 72 hours. Thyroid Function Tests: No results for input(s): "TSH", "T4TOTAL", "FREET4", "T3FREE", "THYROIDAB" in the last 72 hours. Anemia Panel: No results for input(s): "VITAMINB12", "FOLATE", "FERRITIN", "TIBC", "IRON", "RETICCTPCT" in the last 72 hours. Sepsis Labs: No results for input(s): "PROCALCITON", "LATICACIDVEN" in the last 168 hours.  Recent Results (from the past 240 hour(s))  MRSA Next Gen by PCR, Nasal     Status: None   Collection Time: 03/12/22  8:06 AM   Specimen: Nasal Mucosa; Nasal Swab  Result Value Ref Range Status   MRSA by PCR Next Gen NOT DETECTED NOT DETECTED Final    Comment: (NOTE) The GeneXpert MRSA Assay (FDA approved for NASAL specimens only), is one component of a comprehensive MRSA colonization surveillance program. It is not intended to diagnose MRSA infection nor to guide or monitor treatment for MRSA infections. Test performance is not FDA approved in patients less than 37 years old. Performed at [facility], [place] [address], [place], [place] [address]          Radiology Studies: Pelvis Portable  Result Date: 03/12/2022 CLINICAL DATA:  Status post right hip arthroplasty EXAM: PORTABLE PELVIS 1-2 VIEWS COMPARISON:  None Available. FINDINGS: Status post right hip total arthroplasty with expected overlying postoperative change. No evidence of perihardware fracture or component malpositioning. IMPRESSION: Status post right hip total arthroplasty with expected overlying postoperative change. No evidence of perihardware fracture or component malpositioning. Electronically Signed   By: Delanna Ahmadi M.D.   On: 03/12/2022 18:30   DG HIP UNILAT WITH PELVIS 1V RIGHT  Result Date: 03/12/2022 CLINICAL DATA:  Right total hip replacement EXAM: DG HIP (WITH OR WITHOUT PELVIS) 1V RIGHT COMPARISON:  03/11/2022 FINDINGS: Intraoperative fluoroscopy was obtained for surgical control purposes.  Fluoroscopy time recorded at 6 seconds. Dose 0.6678 mGy. Four spot fluoroscopic images are submitted. Spot fluoroscopic images demonstrate initially a fracture of the right femoral neck. Subsequent images demonstrate placement of a right hip arthroplasty. Component is incompletely visualized. IMPRESSION: Intraoperative fluoroscopy obtained for surgical control purposes demonstrating right hip arthroplasty. Electronically Signed   By: Lucienne Capers M.D.   On: 03/12/2022 17:08   DG C-Arm 1-60 Min-No Report  Result Date: 03/12/2022 Fluoroscopy was utilized by the requesting physician.  No radiographic interpretation.   DG C-Arm 1-60 Min-No Report  Result Date: 03/12/2022 Fluoroscopy was utilized by the requesting physician.  No radiographic interpretation.   DG Knee Right Port  Result Date: 03/12/2022 CLINICAL DATA:  Right femur fracture. EXAM: PORTABLE RIGHT KNEE - 1-2 VIEW COMPARISON:  Right knee x-rays dated June 27, 2008. FINDINGS: No evidence of fracture, dislocation, or joint effusion. No evidence of arthropathy or other focal bone abnormality. Soft tissues are unremarkable. IMPRESSION: Negative. Electronically Signed   By: Titus Dubin M.D.   On: 03/12/2022 09:03   DG Hip Unilat With Pelvis 2-3 Views Right  Result Date: 03/11/2022 CLINICAL DATA:  Fall EXAM: DG HIP (WITH OR WITHOUT  PELVIS) 2-3V RIGHT COMPARISON:  No prior hip radiographs, correlation is made with CT abdomen pelvis 02/06/2021 FINDINGS: Impacted, foreshortened fracture of the right femoral neck. No other fracture is seen in the pelvis or left hip degenerative changes in the lumbar spine. Nonobstructive bowel-gas pattern. IMPRESSION: Right femoral neck fracture. Electronically Signed   By: Merilyn Baba M.D.   On: 03/11/2022 18:27   DG Chest 1 View  Result Date: 03/11/2022 CLINICAL DATA:  Un witnessed fall, right hip pain EXAM: CHEST  1 VIEW COMPARISON:  02/09/2021 FINDINGS: Single frontal view of the chest demonstrates  an unremarkable cardiac silhouette. No airspace disease, effusion, or pneumothorax. Left shoulder arthroplasty. No acute fractures. IMPRESSION: 1. No acute intrathoracic process. Electronically Signed   By: Randa Ngo M.D.   On: 03/11/2022 18:26        Scheduled Meds:  amLODipine  10 mg Oral Daily   calcium-vitamin D  1 tablet Oral Q breakfast   divalproex  125 mg Oral TID   docusate sodium  100 mg Oral BID   donepezil  5 mg Oral QHS   enoxaparin (LOVENOX) injection  30 mg Subcutaneous Q24H   feeding supplement  237 mL Oral BID BM   lisinopril  40 mg Oral Daily   melatonin  3 mg Oral QHS   multivitamin with minerals  1 tablet Oral Daily   PARoxetine  20 mg Oral Daily   QUEtiapine  25 mg Oral QHS   senna  1 tablet Oral BID   Continuous Infusions:  sodium chloride 125 mL/hr at 03/13/22 1252   methocarbamol (ROBAXIN) IV       LOS: 2 days    Time spent: 72mns    JKathie Dike MD [facility]   If 7PM-7AM, please contact night-coverage [email or web address]  03/13/2022, 12:55 PM

## 2022-03-14 DIAGNOSIS — I10 Essential (primary) hypertension: Secondary | ICD-10-CM | POA: Diagnosis not present

## 2022-03-14 DIAGNOSIS — S72001A Fracture of unspecified part of neck of right femur, initial encounter for closed fracture: Secondary | ICD-10-CM | POA: Diagnosis not present

## 2022-03-14 DIAGNOSIS — G9340 Encephalopathy, unspecified: Secondary | ICD-10-CM | POA: Diagnosis not present

## 2022-03-14 DIAGNOSIS — F0390 Unspecified dementia without behavioral disturbance: Secondary | ICD-10-CM | POA: Diagnosis not present

## 2022-03-14 LAB — TYPE AND SCREEN
ABO/RH(D): O POS
Antibody Screen: POSITIVE

## 2022-03-14 NOTE — Progress Notes (Signed)
PROGRESS NOTE    Suzanne Monroe  INO:[id number] DOB: 1932/08/24 DOA: 03/11/2022 PCP: Patient, No Pcp Per    Brief Narrative:  88 year old female with history of dementia, admitted to the hospital after an unwitnessed fall.  Found to have right-sided hip fracture.  Admitted for operative management.   Assessment & Plan:   Principal Problem:   Closed right hip fracture, initial encounter ([place]) Active Problems:   Dementia without behavioral disturbance (HCC)   Acute encephalopathy   Essential hypertension   Malnutrition of moderate degree  Right hip fracture -Status post operative management -Continue pain management -PT/OT -originally from ALF, but will need SNF placement on discharge -lovenox for DVT prophylaxis, per ortho -Stable for discharge to skilled nursing facility  Dementia -Suspect worsening delirium related to hip fracture/hospital appointment -Currently mental status appears to be at baseline -Continue on Aricept -Continue Seroquel, Depakote  Hypertension -Continue home medications -Hold beta-blocker in light of bradycardia  Sinus bradycardia -Suspect related to recent beta-blockers -She is asymptomatic -Check TSH   DVT prophylaxis: enoxaparin (LOVENOX) injection 30 mg Start: 03/13/22 0800 SCDs Start: 03/12/22 1844  Code Status: Full code Family Communication: No family present, updated daughter over the phone Disposition Plan: Status is: Inpatient Remains inpatient appropriate because: Postop care, will likely need SNF placement     Consultants:  Orthopedics  Procedures:  Right hip hemiarthroplasty, anterior approach.   Antimicrobials:      Subjective: She is currently laying in bed, sleeping on my arrival, but wakes up to voice.  She denies any pain.  She says she is willing to work with physical therapy when they come by.  Objective: Vitals:   03/13/22 1018 03/13/22 1342 03/13/22 2050 03/14/22 0517  BP: (!) 127/58 122/77 (!) 130/58  112/72  Pulse: (!) 56 (!) 56 (!) 53 (!) 56  Resp:   18 16  Temp: 97.7 F (36.5 C) 97.7 F (36.5 C) 99.5 F (37.5 C) 98.6 F (37 C)  TempSrc:   Oral Oral  SpO2: 94% 98% 94% 95%  Weight:      Height:        Intake/Output Summary (Last 24 hours) at 03/14/2022 1240 Last data filed at 03/14/2022 0900 Gross per 24 hour  Intake 0 ml  Output 800 ml  Net -800 ml   Filed Weights   03/12/22 0100  Weight: 59.4 kg    Examination:  General exam: laying in bed, no distress Respiratory system: Clear to auscultation. Respiratory effort normal. Cardiovascular system: S1 & S2 heard, RRR. No JVD, murmurs, rubs, gallops or clicks. No pedal edema. Gastrointestinal system: Abdomen is nondistended, soft and nontender. No organomegaly or masses felt. Normal bowel sounds heard. Central nervous system:  No focal neurological deficits. Extremities: Symmetric 5 x 5 power. Skin: No rashes, lesions or ulcers Psychiatry: confused    Data Reviewed: I have personally reviewed following labs and imaging studies  CBC: Recent Labs  Lab 03/11/22 1755 03/12/22 2111  WBC 12.8* 14.6*  NEUTROABS 9.8*  --   HGB 13.9 13.2  HCT 40.6 40.1  MCV 91.9 93.9  PLT 192 096   Basic Metabolic Panel: Recent Labs  Lab 03/11/22 1755 03/12/22 0114 03/12/22 1925  NA 142 141  --   K 3.7 4.2  --   CL 111 109  --   CO2 23 22  --   GLUCOSE 125* 140*  --   BUN 16 15  --   CREATININE 0.99 0.95 0.83  CALCIUM 9.0 9.3  --  MG  --  1.9  --    GFR: Estimated Creatinine Clearance: 39.5 mL/min (by C-G formula based on SCr of 0.83 mg/dL). Liver Function Tests: No results for input(s): "AST", "ALT", "ALKPHOS", "BILITOT", "PROT", "ALBUMIN" in the last 168 hours. No results for input(s): "LIPASE", "AMYLASE" in the last 168 hours. No results for input(s): "AMMONIA" in the last 168 hours. Coagulation Profile: Recent Labs  Lab 03/11/22 1755  INR 1.0   Cardiac Enzymes: No results for input(s): "CKTOTAL", "CKMB",  "CKMBINDEX", "TROPONINI" in the last 168 hours. BNP (last 3 results) No results for input(s): "PROBNP" in the last 8760 hours. HbA1C: No results for input(s): "HGBA1C" in the last 72 hours. CBG: No results for input(s): "GLUCAP" in the last 168 hours. Lipid Profile: No results for input(s): "CHOL", "HDL", "LDLCALC", "TRIG", "CHOLHDL", "LDLDIRECT" in the last 72 hours. Thyroid Function Tests: No results for input(s): "TSH", "T4TOTAL", "FREET4", "T3FREE", "THYROIDAB" in the last 72 hours. Anemia Panel: No results for input(s): "VITAMINB12", "FOLATE", "FERRITIN", "TIBC", "IRON", "RETICCTPCT" in the last 72 hours. Sepsis Labs: No results for input(s): "PROCALCITON", "LATICACIDVEN" in the last 168 hours.  Recent Results (from the past 240 hour(s))  MRSA Next Gen by PCR, Nasal     Status: None   Collection Time: 03/12/22  8:06 AM   Specimen: Nasal Mucosa; Nasal Swab  Result Value Ref Range Status   MRSA by PCR Next Gen NOT DETECTED NOT DETECTED Final    Comment: (NOTE) The GeneXpert MRSA Assay (FDA approved for NASAL specimens only), is one component of a comprehensive MRSA colonization surveillance program. It is not intended to diagnose MRSA infection nor to guide or monitor treatment for MRSA infections. Test performance is not FDA approved in patients less than 33 years old. Performed at [facility], [place] [address] [place], [place] [address]          Radiology Studies: Pelvis Portable  Result Date: 03/12/2022 CLINICAL DATA:  Status post right hip arthroplasty EXAM: PORTABLE PELVIS 1-2 VIEWS COMPARISON:  None Available. FINDINGS: Status post right hip total arthroplasty with expected overlying postoperative change. No evidence of perihardware fracture or component malpositioning. IMPRESSION: Status post right hip total arthroplasty with expected overlying postoperative change. No evidence of perihardware fracture or component malpositioning. Electronically  Signed   By: Delanna Ahmadi M.D.   On: 03/12/2022 18:30   DG HIP UNILAT WITH PELVIS 1V RIGHT  Result Date: 03/12/2022 CLINICAL DATA:  Right total hip replacement EXAM: DG HIP (WITH OR WITHOUT PELVIS) 1V RIGHT COMPARISON:  03/11/2022 FINDINGS: Intraoperative fluoroscopy was obtained for surgical control purposes. Fluoroscopy time recorded at 6 seconds. Dose 0.6678 mGy. Four spot fluoroscopic images are submitted. Spot fluoroscopic images demonstrate initially a fracture of the right femoral neck. Subsequent images demonstrate placement of a right hip arthroplasty. Component is incompletely visualized. IMPRESSION: Intraoperative fluoroscopy obtained for surgical control purposes demonstrating right hip arthroplasty. Electronically Signed   By: Lucienne Capers M.D.   On: 03/12/2022 17:08   DG C-Arm 1-60 Min-No Report  Result Date: 03/12/2022 Fluoroscopy was utilized by the requesting physician.  No radiographic interpretation.   DG C-Arm 1-60 Min-No Report  Result Date: 03/12/2022 Fluoroscopy was utilized by the requesting physician.  No radiographic interpretation.        Scheduled Meds:  amLODipine  10 mg Oral Daily   calcium-vitamin D  1 tablet Oral Q breakfast   divalproex  125 mg Oral TID   docusate sodium  100 mg Oral BID  donepezil  5 mg Oral QHS   enoxaparin (LOVENOX) injection  30 mg Subcutaneous Q24H   feeding supplement  237 mL Oral BID BM   lisinopril  40 mg Oral Daily   melatonin  3 mg Oral QHS   multivitamin with minerals  1 tablet Oral Daily   PARoxetine  20 mg Oral Daily   QUEtiapine  25 mg Oral QHS   senna  1 tablet Oral BID   Continuous Infusions:  methocarbamol (ROBAXIN) IV       LOS: 3 days    Time spent: 14mns    JKathie Dike MD [facility]   If 7PM-7AM, please contact night-coverage [email or web address]  03/14/2022, 12:40 PM

## 2022-03-14 NOTE — Progress Notes (Signed)
OT Cancellation Note  Patient Details Name: Suzanne Monroe MRN: [id number] DOB: 1932/11/06   Cancelled Treatment:    Reason Eval/Treat Not Completed: Patient declined, no reason specified Patient declined to participate in session asking therapist to leave. Nurse tech attempted to encourage patient as well with patient reporting "no". OT to check back on 7/24 and sign off if patient continues to decline to participate.  Jackelyn Poling OTR/L, [place] Acute Rehabilitation Department Office# [phone] Pager# [phone]  03/14/2022, 10:18 AM

## 2022-03-15 ENCOUNTER — Encounter (HOSPITAL_COMMUNITY): Payer: Self-pay | Admitting: Orthopedic Surgery

## 2022-03-15 ENCOUNTER — Other Ambulatory Visit: Payer: Self-pay

## 2022-03-15 DIAGNOSIS — G9340 Encephalopathy, unspecified: Secondary | ICD-10-CM | POA: Diagnosis not present

## 2022-03-15 DIAGNOSIS — I10 Essential (primary) hypertension: Secondary | ICD-10-CM | POA: Diagnosis not present

## 2022-03-15 DIAGNOSIS — F0390 Unspecified dementia without behavioral disturbance: Secondary | ICD-10-CM | POA: Diagnosis not present

## 2022-03-15 DIAGNOSIS — S72001A Fracture of unspecified part of neck of right femur, initial encounter for closed fracture: Secondary | ICD-10-CM | POA: Diagnosis not present

## 2022-03-15 LAB — BASIC METABOLIC PANEL
Anion gap: 5 (ref 5–15)
BUN: 18 mg/dL (ref 8–23)
CO2: 23 mmol/L (ref 22–32)
Calcium: 7.9 mg/dL — ABNORMAL LOW (ref 8.9–10.3)
Chloride: 111 mmol/L (ref 98–111)
Creatinine, Ser: 0.66 mg/dL (ref 0.44–1.00)
GFR, Estimated: >60 mL/min (ref >60)
Glucose, Bld: 89 mg/dL (ref 70–99)
Potassium: 3.6 mmol/L (ref 3.5–5.1)
Sodium: 139 mmol/L (ref 135–145)

## 2022-03-15 LAB — CBC
HCT: 31.2 % — ABNORMAL LOW (ref 36.0–46.0)
Hemoglobin: 10.4 g/dL — ABNORMAL LOW (ref 12.0–15.0)
MCH: 31.7 pg (ref 26.0–34.0)
MCHC: 33.3 g/dL (ref 30.0–36.0)
MCV: 95.1 fL (ref 80.0–100.0)
Platelets: 143 10*3/uL — ABNORMAL LOW (ref 150–400)
RBC: 3.28 MIL/uL — ABNORMAL LOW (ref 3.87–5.11)
RDW: 13.7 % (ref 11.5–15.5)
WBC: 10.4 10*3/uL (ref 4.0–10.5)
nRBC: 0.2 % (ref 0.0–0.2)

## 2022-03-15 LAB — TSH: TSH: 1.725 u[IU]/mL (ref 0.350–4.500)

## 2022-03-15 MED ORDER — APIXABAN 2.5 MG PO TABS: 2.5000 mg | ORAL_TABLET | Freq: Two times a day (BID) | ORAL | 0 refills | Status: DC

## 2022-03-15 MED ORDER — HYDROCODONE-ACETAMINOPHEN 5-325 MG PO TABS: 1.0000 | ORAL_TABLET | ORAL | 0 refills | Status: DC | PRN

## 2022-03-15 NOTE — Progress Notes (Signed)
Physical Therapy Treatment Patient Details Name: ABBEE Monroe MRN: [id number] DOB: [birth year] Today's Date: 03/15/2022   History of Present Illness 88 year old female with history of dementia, admitted to the hospital after an unwitnessed fall.  Pt was found to have right-sided hip fracture s/p right hip hemiarthroplasty anterior approach on 03/12/22.    PT Comments    Progressing with mobility. Pt was able to ambulate on today. She participated well with cueing and time. Continue to recommend ST SNF.    Recommendations for follow up therapy are one component of a multi-disciplinary discharge planning process, led by the attending physician.  Recommendations may be updated based on patient status, additional functional criteria and insurance authorization.  Follow Up Recommendations  Skilled nursing-short term rehab (<3 hours/day) Can patient physically be transported by private vehicle: No   Assistance Recommended at Discharge Frequent or constant Supervision/Assistance  Patient can return home with the following A lot of help with walking and/or transfers;A lot of help with bathing/dressing/bathroom;Assistance with cooking/housework;Assist for transportation;Help with stairs or ramp for entrance   Equipment Recommendations  None recommended by PT    Recommendations for Other Services       Precautions / Restrictions Precautions Precautions: Fall Restrictions Weight Bearing Restrictions: No RLE Weight Bearing: Weight bearing as tolerated     Mobility  Bed Mobility Overal bed mobility: Needs Assistance Bed Mobility: Supine to Sit     Supine to sit: Max assist, HOB elevated     General bed mobility comments: Assist for trunk and LEs. Utilized bedpad to assist with scooting, positioning at EOB. Increased time. Multimodal cueing required.    Transfers Overall transfer level: Needs assistance Equipment used: Rolling walker (2 wheels) Transfers: Sit to/from Stand Sit to  Stand: Mod assist   Step pivot transfers: Min assist       General transfer comment: Assist to rise, steady, control descent. Cues for safety, technique, hand placement.    Ambulation/Gait Min assist   Gait Distance (Feet): 10 Feet Assistive device: Rolling walker (2 wheels) Gait Pattern/deviations: Step-to pattern       General Gait Details: Assist to stabilize pt throughout distance. Followed closely with recliner. Cues for safety, technique. Pt tolerated distance well.   Stairs             Wheelchair Mobility    Modified Rankin (Stroke Patients Only)       Balance Overall balance assessment: Needs assistance, History of Falls         Standing balance support: Reliant on assistive device for balance, Bilateral upper extremity supported, During functional activity Standing balance-Leahy Scale: Poor                              Cognition Arousal/Alertness: Awake/alert Behavior During Therapy: Flat affect Overall Cognitive Status: History of cognitive impairments - at baseline                                 General Comments: cooperative and followed commands with time        Exercises      General Comments        Pertinent Vitals/Pain Pain Assessment Pain Assessment: Faces Faces Pain Scale: Hurts little more Pain Location: R hip with movement Pain Descriptors / Indicators: Discomfort, Sore, Grimacing Pain Intervention(s): Limited activity within patient's tolerance, Monitored during session, Repositioned    Home Living Family/patient  expects to be discharged to:: Assisted living                 Home Equipment: Conservation officer, nature (2 wheels)      Prior Function            PT Goals (current goals can now be found in the care plan section) Progress towards PT goals: Progressing toward goals    Frequency    Min 2X/week      PT Plan Current plan remains appropriate    Co-evaluation               AM-PAC PT "6 Clicks" Mobility   Outcome Measure  Help needed turning from your back to your side while in a flat bed without using bedrails?: A Lot Help needed moving from lying on your back to sitting on the side of a flat bed without using bedrails?: A Lot Help needed moving to and from a bed to a chair (including a wheelchair)?: A Little Help needed standing up from a chair using your arms (e.g., wheelchair or bedside chair)?: A Lot Help needed to walk in hospital room?: A Lot Help needed climbing 3-5 steps with a railing? : Total 6 Click Score: 12    End of Session Equipment Utilized During Treatment: Gait belt Activity Tolerance: Patient tolerated treatment well Patient left: in chair;with call bell/phone within reach;with chair alarm set   PT Visit Diagnosis: History of falling (Z91.81);Difficulty in walking, not elsewhere classified (R26.2);Pain Pain - Right/Left: Right Pain - part of body: Hip     Time: 1138-1150 PT Time Calculation (min) (ACUTE ONLY): 12 min  Charges:  $Gait Training: 8-22 mins                        Doreatha Massed, PT Acute Rehabilitation  Office: [phone] Pager: [phone]

## 2022-03-15 NOTE — Evaluation (Signed)
Occupational Therapy Evaluation Patient Details Name: Suzanne Monroe MRN: [id number] DOB: 1933-03-22 Today's Date: 03/15/2022   History of Present Illness 88 year old female with history of dementia, admitted to the hospital after an unwitnessed fall.  Pt was found to have right-sided hip fracture s/p right hip hemiarthroplasty anterior approach on 03/12/22.   Clinical Impression   Patient is a 88 year old confused female who was admitted for above. Patient was noted to have increased confusion, pain and decreased safety awareness impacting participation in ADLs. Patient was noted to have decreased functional activity tolerance, decreased endurance, decreased sitting balance, decreased standing balance, decreased safety awareness, and decreased knowledge of AD/AE impacting participation in ADLs. Patient would continue to benefit from skilled OT services at this time while admitted and after d/c to address noted deficits in order to improve overall safety and independence in ADLs.         Recommendations for follow up therapy are one component of a multi-disciplinary discharge planning process, led by the attending physician.  Recommendations may be updated based on patient status, additional functional criteria and insurance authorization.   Follow Up Recommendations  Skilled nursing-short term rehab (<3 hours/day)    Assistance Recommended at Discharge Frequent or constant Supervision/Assistance  Patient can return home with the following A lot of help with walking and/or transfers;A lot of help with bathing/dressing/bathroom;Assistance with cooking/housework;Direct supervision/assist for financial management;Assistance with feeding;Assist for transportation;Help with stairs or ramp for entrance;Direct supervision/assist for medications management    Functional Status Assessment  Patient has had a recent decline in their functional status and demonstrates the ability to make significant  improvements in function in a reasonable and predictable amount of time.  Equipment Recommendations  Other (comment) (defer to next venue)    Recommendations for Other Services       Precautions / Restrictions Precautions Precautions: Fall Restrictions Weight Bearing Restrictions: Yes RLE Weight Bearing: Weight bearing as tolerated      Mobility Bed Mobility Overal bed mobility: Needs Assistance Bed Mobility: Supine to Sit     Supine to sit: Max assist, HOB elevated     General bed mobility comments: required assist for lower body due to pain, pt attempted to assist with upper body holding bed rail but still required assist for trunk upright    Transfers                          Balance Overall balance assessment: Needs assistance Sitting-balance support: Bilateral upper extremity supported, Feet supported Sitting balance-Leahy Scale: Poor   Postural control: Right lateral lean Standing balance support: Reliant on assistive device for balance, Bilateral upper extremity supported, During functional activity Standing balance-Leahy Scale: Poor                             ADL either performed or assessed with clinical judgement   ADL Overall ADL's : Needs assistance/impaired Eating/Feeding: Minimal assistance;Sitting   Grooming: Wash/dry hands;Wash/dry face;Minimal assistance;Sitting Grooming Details (indicate cue type and reason): patient needed min A for thoughness after very loose stool. Upper Body Bathing: Moderate assistance;Bed level   Lower Body Bathing: Bed level;Total assistance   Upper Body Dressing : Maximal assistance;Bed level   Lower Body Dressing: Bed level;Total assistance   Toilet Transfer: +2 for physical assistance;+2 for safety/equipment;Minimal assistance   Toileting- Clothing Manipulation and Hygiene: Total assistance;Bed level  Vision Baseline Vision/History: 1 Wears glasses Vision Assessment?: No  apparent visual deficits     Perception     Praxis      Pertinent Vitals/Pain Pain Assessment Pain Assessment: Faces Faces Pain Scale: Hurts even more Pain Location: R hip with movement Pain Descriptors / Indicators: Discomfort, Grimacing, Operative site guarding Pain Intervention(s): Monitored during session, Repositioned     Hand Dominance Right   Extremity/Trunk Assessment Upper Extremity Assessment Upper Extremity Assessment: Overall WFL for tasks assessed   Lower Extremity Assessment Lower Extremity Assessment: Defer to PT evaluation   Cervical / Trunk Assessment Cervical / Trunk Assessment: Normal   Communication Communication Communication: No difficulties   Cognition Arousal/Alertness: Awake/alert Behavior During Therapy: Flat affect Overall Cognitive Status: History of cognitive impairments - at baseline                                 General Comments: patietn was notably more cooperative today     General Comments       Exercises     Shoulder Instructions      Home Living Family/patient expects to be discharged to:: Assisted living                             Home Equipment: Rolling Walker (2 wheels)          Prior Functioning/Environment Prior Level of Function : Needs assist  Cognitive Assist : Mobility (cognitive);ADLs (cognitive)           Mobility Comments: minimal ambulation at baseline per daughter, from memory care ALF          OT Problem List: Decreased activity tolerance;Decreased cognition;Impaired balance (sitting and/or standing);Decreased safety awareness;Decreased knowledge of precautions;Decreased knowledge of use of DME or AE;Pain      OT Treatment/Interventions: Self-care/ADL training;Therapeutic exercise;Neuromuscular education;Energy conservation;DME and/or AE instruction;Therapeutic activities;Balance training;Patient/family education    OT Goals(Current goals can be found in the care  plan section) Acute Rehab OT Goals Patient Stated Goal: none stated OT Goal Formulation: Patient unable to participate in goal setting Time For Goal Achievement: 03/29/22 Potential to Achieve Goals: Fair  OT Frequency: Min 2X/week    Co-evaluation              AM-PAC OT "6 Clicks" Daily Activity     Outcome Measure Help from another person eating meals?: A Lot Help from another person taking care of personal grooming?: A Lot Help from another person toileting, which includes using toliet, bedpan, or urinal?: Total Help from another person bathing (including washing, rinsing, drying)?: Total Help from another person to put on and taking off regular upper body clothing?: A Lot Help from another person to put on and taking off regular lower body clothing?: Total 6 Click Score: 9   End of Session Equipment Utilized During Treatment: Rolling walker (2 wheels)  Activity Tolerance: Patient tolerated treatment well Patient left: in chair;with call bell/phone within reach;Other (comment) (with PT)  OT Visit Diagnosis: Unsteadiness on feet (R26.81);Other abnormalities of gait and mobility (R26.89);Muscle weakness (generalized) (M62.81);History of falling (Z91.81);Pain                Time: 5852-7782 OT Time Calculation (min): 21 min Charges:  OT General Charges $OT Visit: 1 Visit OT Evaluation $OT Eval Moderate Complexity: 1 Mod  Jackelyn Poling OTR/L, MS Acute Rehabilitation Department Office# [phone] Pager# [phone]   [facility]  Janine Limbo 03/15/2022, 12:35 PM

## 2022-03-15 NOTE — Progress Notes (Signed)
    Subjective:  Patient reports pain as mild to moderate.  Denies N/V/CP/SOB/Abd pain. Patient has dementia at baseline. She is cooperative for exam this morning. She is not reporting any pain. She denies tingling and numbness in LE bilaterally.   Objective:   VITALS:   Vitals:   03/14/22 0517 03/14/22 1249 03/14/22 2108 03/15/22 0630  BP: 112/72 (!) 145/54 (!) 152/63 125/73  Pulse: (!) 56 (!) 56 71 63  Resp: '16 20 17 18  '$ Temp: 98.6 F (37 C) 98.6 F (37 C) 99 F (37.2 C) 98.4 F (36.9 C)  TempSrc: Oral Oral Oral Oral  SpO2: 95% 95% 98% 92%  Weight:      Height:        Patient is lying in bed sleeping. She woke up for exam. NAD. Dementia at baseline. Cooperative for exam this morning.  ABD soft Neurovascular intact Sensation intact distally Intact pulses distally Dorsiflexion/Plantar flexion intact Incision: dressing C/D/I No cellulitis present Compartment soft Painless log rolling of the hip.   Lab Results  Component Value Date   WBC 10.4 03/15/2022   HGB 10.4 (L) 03/15/2022   HCT 31.2 (L) 03/15/2022   MCV 95.1 03/15/2022   PLT 143 (L) 03/15/2022   BMET    Component Value Date/Time   NA 139 03/15/2022 0446   K 3.6 03/15/2022 0446   CL 111 03/15/2022 0446   CO2 23 03/15/2022 0446   GLUCOSE 89 03/15/2022 0446   BUN 18 03/15/2022 0446   CREATININE 0.66 03/15/2022 0446   CALCIUM 7.9 (L) 03/15/2022 0446   GFRNONAA >60 03/15/2022 0446     Assessment/Plan: 3 Days Post-Op   Principal Problem:   Closed right hip fracture, initial encounter ([place]) Active Problems:   Essential hypertension   Dementia without behavioral disturbance (HCC)   Acute encephalopathy   Malnutrition of moderate degree   WBAT with walker DVT ppx: Lovenox during hospital stay. Will switch to eliquis 2.'5mg'$  BID for DVT ppx due to NSAID intolerance and GI upset, SCDs, TEDS PO pain control PT/OT: Patient has not been very cooperative with PT with underlying dementia. PT to come by  again today.  Dispo: Disposition per medical team recommendations. Likely d/c to SNF per notes. Pain medication and DVT ppx printed in chart.    Charlott Rakes, PA-C 03/15/2022, 7:16 AM  [facility]  [place] [address], [address], [place], [place] [address]

## 2022-03-15 NOTE — TOC Progression Note (Signed)
Transition of Care [facility]) - Progression Note    Patient Details  Name: Suzanne Monroe MRN: [id number] Date of Birth: 04/10/1933  [facility]) CM/SW Contact  Isrrael Fluckiger, Juliann Pulse, RN Phone Number: 03/15/2022, 12:24 PM  Clinical Narrative:  Submitted for pasrr await pasrr. Faxed out for ST SNF-await bed offers.     Expected Discharge Plan: [place] Barriers to Discharge: Continued Medical Work up  Expected Discharge Plan and Services Expected Discharge Plan: [place]   Discharge Planning Services: CM Consult Post Acute Care Choice: [place] Living arrangements for the past 2 months: Assisted Living Facility                                       Social Determinants of Health (SDOH) Interventions    Readmission Risk Interventions     No data to display

## 2022-03-15 NOTE — TOC PASRR Note (Signed)
[place] Note   Patient Details  Name: Suzanne Monroe Date of Birth: [birth year]   Transition of Care [facility]) CM/SW Contact:    Dessa Phi, RN Phone Number: 03/15/2022, 11:50 AM  To Whom It May Concern:  Please be advised that this patient will require a short-term nursing home stay - anticipated 30 days or less for rehabilitation and strengthening.   The plan is for return home.

## 2022-03-15 NOTE — TOC Progression Note (Signed)
Transition of Care [facility]) - Progression Note    Patient Details  Name: TZIPPORAH NAGORSKI MRN: [id number] Date of Birth: 1932-09-11  Transition of Care [facility]) CM/SW Contact  Yenesis Even, Juliann Pulse, RN Phone Number: 03/15/2022, 11:55 AM  Clinical Narrative: PT recc SNF;Spoke to [facility] rep Barbara-unable to accept back;faxed out for SNF dtr [name] in agreement-she does not want fax to go to [facility], all other SNF's ok.Await pasrr.      Expected Discharge Plan: [place] Barriers to Discharge: Continued Medical Work up  Expected Discharge Plan and Services Expected Discharge Plan: [place]   Discharge Planning Services: CM Consult Post Acute Care Choice: [place] Living arrangements for the past 2 months: Assisted Living Facility                                       Social Determinants of Health (SDOH) Interventions    Readmission Risk Interventions     No data to display

## 2022-03-15 NOTE — NC FL2 (Signed)
[place] LEVEL OF CARE SCREENING TOOL     IDENTIFICATION  Patient Name: Suzanne Monroe Birthdate: May 06, 1933 Sex: female Admission Date (Current Location): 03/11/2022  [facility] and [place] Number:  Herbalist and Address:  [facility],  [place], [place]      Provider Number: [id number]  Attending Physician Name and Address:  Kathie Dike, MD  Relative Name and Phone Number:   Faye Ramsay dtr [phone])    Current Level of Care: Hospital Recommended Level of Care: [place] Prior Approval Number:    Date Approved/Denied:   PASRR Number:    Discharge Plan: SNF    Current Diagnoses: Patient Active Problem List   Diagnosis Date Noted   Malnutrition of moderate degree 03/12/2022   Closed right hip fracture, initial encounter ([place]) 03/11/2022   Dementia without behavioral disturbance ([place]) 03/11/2022   Acute encephalopathy 03/11/2022   UTI (urinary tract infection) 02/06/2021   Leukocytosis 02/06/2021   AKI (acute kidney injury) ([place]) 02/06/2021   Essential hypertension 10/28/2014   Hyperlipidemia 10/28/2014   Central retinal vein occlusion 10/28/2014   Irregular heartbeat 10/28/2014   Fibromyalgia 05/15/2013   Low TSH level 05/07/2013   Memory loss 05/07/2013   Hypokalemia 05/07/2013   Frequent falls 05/07/2013   Anxiety and depression 10/30/2007   DYSPEPSIA 10/30/2007   HYPERCHOLESTEROLEMIA, PURE 03/30/2007   ICHTHYOSIS 03/29/2007   MIGRAINE, CHRONIC 11/03/2006   Irritable bowel syndrome 11/03/2006   OSTEOARTHRITIS 11/03/2006   POLYMYALGIA RHEUMATICA 11/03/2006   PERIPHERAL NEUROPATHY 12/21/2000   COLITIS, ULCERATIVE NOS 08/23/1993    Orientation RESPIRATION BLADDER Height & Weight     Self, Situation  Normal Incontinent Weight: 59.4 kg Height:  '5\' 3"'$  (160 cm)  BEHAVIORAL SYMPTOMS/MOOD NEUROLOGICAL BOWEL NUTRITION STATUS      Incontinent Diet (Regular)  AMBULATORY STATUS  COMMUNICATION OF NEEDS Skin   Limited Assist Verbally Surgical wounds (R hip surgical incision)                       Personal Care Assistance Level of Assistance  Bathing, Feeding, Dressing Bathing Assistance: Limited assistance Feeding assistance: Limited assistance Dressing Assistance: Limited assistance     Functional Limitations Info  Sight, Hearing, Speech Sight Info: Impaired (eyeglasses) Hearing Info: Adequate Speech Info: Adequate    SPECIAL CARE FACTORS FREQUENCY  PT (By licensed PT), OT (By licensed OT)     PT Frequency:  (5x week) OT Frequency:  (5x week)            Contractures Contractures Info: Not present    Additional Factors Info  Code Status, Allergies, Psychotropic Code Status Info:  (Full) Allergies Info:  (Ampicillin, Cefaclor, Codeine, Duloxetine, Fexofenadine, Ibuprofen, Loratadine, Nsaids) Psychotropic Info:  (Depakote;Seroquel)         Current Medications (03/15/2022):  This is the current hospital active medication list Current Facility-Administered Medications  Medication Dose Route Frequency Provider Last Rate Last Admin   acetaminophen (TYLENOL) tablet 325-650 mg  325-650 mg Oral Q6H PRN Swinteck, Aaron Edelman, MD       amLODipine (NORVASC) tablet 10 mg  10 mg Oral Daily Rod Can, MD   10 mg at 03/15/22 0945   calcium-vitamin D (OSCAL WITH D) 500-5 MG-MCG per tablet 1 tablet  1 tablet Oral Q breakfast Rod Can, MD   1 tablet at 03/15/22 0944   divalproex (DEPAKOTE SPRINKLE) capsule 125 mg  125 mg Oral TID Rod Can, MD  125 mg at 03/15/22 0944   docusate sodium (COLACE) capsule 100 mg  100 mg Oral BID Rod Can, MD   100 mg at 03/15/22 0945   donepezil (ARICEPT) tablet 5 mg  5 mg Oral QHS Swinteck, Aaron Edelman, MD   5 mg at 03/14/22 2107   enoxaparin (LOVENOX) injection 30 mg  30 mg Subcutaneous Q24H Swinteck, Aaron Edelman, MD   30 mg at 03/15/22 0945   feeding supplement (ENSURE ENLIVE / ENSURE PLUS) liquid 237 mL  237 mL Oral  BID BM Swinteck, Aaron Edelman, MD   237 mL at 03/15/22 0949   haloperidol lactate (HALDOL) injection 2-4 mg  2-4 mg Intravenous Q6H PRN Swinteck, Aaron Edelman, MD       HYDROcodone-acetaminophen (NORCO) 7.5-325 MG per tablet 1-2 tablet  1-2 tablet Oral Q4H PRN Swinteck, Aaron Edelman, MD       HYDROcodone-acetaminophen (NORCO/VICODIN) 5-325 MG per tablet 1-2 tablet  1-2 tablet Oral Q4H PRN Swinteck, Aaron Edelman, MD       lisinopril (ZESTRIL) tablet 40 mg  40 mg Oral Daily Swinteck, Aaron Edelman, MD   40 mg at 03/15/22 0944   magnesium hydroxide (MILK OF MAGNESIA) suspension 30 mL  30 mL Oral QHS PRN Swinteck, Aaron Edelman, MD       melatonin tablet 3 mg  3 mg Oral QHS Swinteck, Aaron Edelman, MD   3 mg at 03/14/22 2107   menthol-cetylpyridinium (CEPACOL) lozenge 3 mg  1 lozenge Oral PRN Swinteck, Aaron Edelman, MD       Or   phenol (CHLORASEPTIC) mouth spray 1 spray  1 spray Mouth/Throat PRN Swinteck, Aaron Edelman, MD       methocarbamol (ROBAXIN) tablet 500 mg  500 mg Oral Q6H PRN Rod Can, MD   500 mg at 03/14/22 2107   Or   methocarbamol (ROBAXIN) 500 mg in dextrose 5 % 50 mL IVPB  500 mg Intravenous Q6H PRN Swinteck, Aaron Edelman, MD       metoCLOPramide (REGLAN) tablet 5-10 mg  5-10 mg Oral Q8H PRN Swinteck, Aaron Edelman, MD       Or   metoCLOPramide (REGLAN) injection 5-10 mg  5-10 mg Intravenous Q8H PRN Swinteck, Aaron Edelman, MD       morphine (PF) 2 MG/ML injection 0.5-1 mg  0.5-1 mg Intravenous Q2H PRN Rod Can, MD   1 mg at 03/14/22 1020   multivitamin with minerals tablet 1 tablet  1 tablet Oral Daily Swinteck, Aaron Edelman, MD   1 tablet at 03/15/22 0945   ondansetron (ZOFRAN) tablet 4 mg  4 mg Oral Q6H PRN Swinteck, Aaron Edelman, MD       Or   ondansetron (ZOFRAN) injection 4 mg  4 mg Intravenous Q6H PRN Swinteck, Aaron Edelman, MD       PARoxetine (PAXIL) tablet 20 mg  20 mg Oral Daily Swinteck, Aaron Edelman, MD   20 mg at 03/15/22 0945   polyethylene glycol (MIRALAX / GLYCOLAX) packet 17 g  17 g Oral Daily PRN Swinteck, Aaron Edelman, MD       QUEtiapine (SEROQUEL) tablet 25 mg  25 mg  Oral QHS Rod Can, MD   25 mg at 03/14/22 2107   senna (SENOKOT) tablet 8.6 mg  1 tablet Oral BID Rod Can, MD   8.6 mg at 03/15/22 0945     Discharge Medications: Please see discharge summary for a list of discharge medications.  Relevant Imaging Results:  Relevant Lab Results:   Additional Information SS# [id number];[employer] x2  Tuyet Bader, Juliann Pulse, RN

## 2022-03-15 NOTE — NC FL2 (Signed)
[place] LEVEL OF CARE SCREENING TOOL     IDENTIFICATION  Patient Name: Suzanne Monroe Birthdate: [birth year] Sex: female Admission Date (Current Location): 03/11/2022  [facility] and [place] Number:  Herbalist and Address:  [facility],  [place], [place]      Provider Number: [id number]  Attending Physician Name and Address:  Kathie Dike, MD  Relative Name and Phone Number:   Faye Ramsay dtr [phone])    Current Level of Care: Hospital Recommended Level of Care: Assisted Living Facility Prior Approval Number:    Date Approved/Denied:   PASRR Number:    Discharge Plan: Other (Comment) (ALF)    Current Diagnoses: Patient Active Problem List   Diagnosis Date Noted   Malnutrition of moderate degree 03/12/2022   Closed right hip fracture, initial encounter ([place]) 03/11/2022   Dementia without behavioral disturbance ([place]) 03/11/2022   Acute encephalopathy 03/11/2022   UTI (urinary tract infection) 02/06/2021   Leukocytosis 02/06/2021   AKI (acute kidney injury) ([place]) 02/06/2021   Essential hypertension 10/28/2014   Hyperlipidemia 10/28/2014   Central retinal vein occlusion 10/28/2014   Irregular heartbeat 10/28/2014   Fibromyalgia 05/15/2013   Low TSH level 05/07/2013   Memory loss 05/07/2013   Hypokalemia 05/07/2013   Frequent falls 05/07/2013   Anxiety and depression 10/30/2007   DYSPEPSIA 10/30/2007   HYPERCHOLESTEROLEMIA, PURE 03/30/2007   ICHTHYOSIS 03/29/2007   MIGRAINE, CHRONIC 11/03/2006   Irritable bowel syndrome 11/03/2006   OSTEOARTHRITIS 11/03/2006   POLYMYALGIA RHEUMATICA 11/03/2006   PERIPHERAL NEUROPATHY 12/21/2000   COLITIS, ULCERATIVE NOS 08/23/1993    Orientation RESPIRATION BLADDER Height & Weight     Self  Normal Incontinent Weight: 59.4 kg Height:  '5\' 3"'$  (160 cm)  BEHAVIORAL SYMPTOMS/MOOD NEUROLOGICAL BOWEL NUTRITION STATUS      Incontinent Diet (Regular)  AMBULATORY STATUS  COMMUNICATION OF NEEDS Skin   Limited Assist Verbally Surgical wounds (R hip surgical incision.)                       Personal Care Assistance Level of Assistance  Bathing, Feeding, Dressing Bathing Assistance: Limited assistance Feeding assistance: Limited assistance Dressing Assistance: Limited assistance     Functional Limitations Info  Sight, Hearing, Speech Sight Info: Impaired (eyeglasses) Hearing Info: Adequate Speech Info: Adequate    SPECIAL CARE FACTORS FREQUENCY  PT (By licensed PT), OT (By licensed OT)     PT Frequency:  (3x week) OT Frequency:  (3x week)            Contractures Contractures Info: Not present    Additional Factors Info  Code Status, Allergies, Psychotropic Code Status Info:  (Full) Allergies Info:  (Ampicillin, Cefaclor, Codeine, Duloxetine, Fexofenadine, Ibuprofen, Loratadine, Nsaids) Psychotropic Info:  (seroquel;depakote)         Current Medications (03/15/2022):  This is the current hospital active medication list Current Facility-Administered Medications  Medication Dose Route Frequency Provider Last Rate Last Admin   acetaminophen (TYLENOL) tablet 325-650 mg  325-650 mg Oral Q6H PRN Swinteck, Aaron Edelman, MD       amLODipine (NORVASC) tablet 10 mg  10 mg Oral Daily Rod Can, MD   10 mg at 03/15/22 0945   calcium-vitamin D (OSCAL WITH D) 500-5 MG-MCG per tablet 1 tablet  1 tablet Oral Q breakfast Rod Can, MD   1 tablet at 03/15/22 0944   divalproex (DEPAKOTE SPRINKLE) capsule 125 mg  125 mg Oral TID Rod Can, MD  125 mg at 03/15/22 0944   docusate sodium (COLACE) capsule 100 mg  100 mg Oral BID Rod Can, MD   100 mg at 03/15/22 0945   donepezil (ARICEPT) tablet 5 mg  5 mg Oral QHS Swinteck, Aaron Edelman, MD   5 mg at 03/14/22 2107   enoxaparin (LOVENOX) injection 30 mg  30 mg Subcutaneous Q24H Swinteck, Aaron Edelman, MD   30 mg at 03/15/22 0945   feeding supplement (ENSURE ENLIVE / ENSURE PLUS) liquid 237 mL  237 mL  Oral BID BM Swinteck, Aaron Edelman, MD   237 mL at 03/15/22 0949   haloperidol lactate (HALDOL) injection 2-4 mg  2-4 mg Intravenous Q6H PRN Swinteck, Aaron Edelman, MD       HYDROcodone-acetaminophen (NORCO) 7.5-325 MG per tablet 1-2 tablet  1-2 tablet Oral Q4H PRN Swinteck, Aaron Edelman, MD       HYDROcodone-acetaminophen (NORCO/VICODIN) 5-325 MG per tablet 1-2 tablet  1-2 tablet Oral Q4H PRN Swinteck, Aaron Edelman, MD       lisinopril (ZESTRIL) tablet 40 mg  40 mg Oral Daily Swinteck, Aaron Edelman, MD   40 mg at 03/15/22 0944   magnesium hydroxide (MILK OF MAGNESIA) suspension 30 mL  30 mL Oral QHS PRN Swinteck, Aaron Edelman, MD       melatonin tablet 3 mg  3 mg Oral QHS Swinteck, Aaron Edelman, MD   3 mg at 03/14/22 2107   menthol-cetylpyridinium (CEPACOL) lozenge 3 mg  1 lozenge Oral PRN Swinteck, Aaron Edelman, MD       Or   phenol (CHLORASEPTIC) mouth spray 1 spray  1 spray Mouth/Throat PRN Swinteck, Aaron Edelman, MD       methocarbamol (ROBAXIN) tablet 500 mg  500 mg Oral Q6H PRN Rod Can, MD   500 mg at 03/14/22 2107   Or   methocarbamol (ROBAXIN) 500 mg in dextrose 5 % 50 mL IVPB  500 mg Intravenous Q6H PRN Swinteck, Aaron Edelman, MD       metoCLOPramide (REGLAN) tablet 5-10 mg  5-10 mg Oral Q8H PRN Swinteck, Aaron Edelman, MD       Or   metoCLOPramide (REGLAN) injection 5-10 mg  5-10 mg Intravenous Q8H PRN Swinteck, Aaron Edelman, MD       morphine (PF) 2 MG/ML injection 0.5-1 mg  0.5-1 mg Intravenous Q2H PRN Rod Can, MD   1 mg at 03/14/22 1020   multivitamin with minerals tablet 1 tablet  1 tablet Oral Daily Swinteck, Aaron Edelman, MD   1 tablet at 03/15/22 0945   ondansetron (ZOFRAN) tablet 4 mg  4 mg Oral Q6H PRN Swinteck, Aaron Edelman, MD       Or   ondansetron (ZOFRAN) injection 4 mg  4 mg Intravenous Q6H PRN Swinteck, Aaron Edelman, MD       PARoxetine (PAXIL) tablet 20 mg  20 mg Oral Daily Swinteck, Aaron Edelman, MD   20 mg at 03/15/22 0945   polyethylene glycol (MIRALAX / GLYCOLAX) packet 17 g  17 g Oral Daily PRN Swinteck, Aaron Edelman, MD       QUEtiapine (SEROQUEL) tablet 25 mg   25 mg Oral QHS Rod Can, MD   25 mg at 03/14/22 2107   senna (SENOKOT) tablet 8.6 mg  1 tablet Oral BID Rod Can, MD   8.6 mg at 03/15/22 0945     Discharge Medications: Please see discharge summary for a list of discharge medications.  Relevant Imaging Results:  Relevant Lab Results:   Additional Information SS# [id number];Moderna x2  Satoshi Kalas, Juliann Pulse, RN

## 2022-03-15 NOTE — Progress Notes (Signed)
PROGRESS NOTE    KEYMANI GLYNN  YQM:[id number] DOB: [birth year] DOA: 03/11/2022 PCP: Patient, No Pcp Per    Brief Narrative:  88 year old female with history of dementia, admitted to the hospital after an unwitnessed fall.  Found to have right-sided hip fracture.  Admitted for operative management.   Assessment & Plan:   Principal Problem:   Closed right hip fracture, initial encounter ([place]) Active Problems:   Dementia without behavioral disturbance (HCC)   Acute encephalopathy   Essential hypertension   Malnutrition of moderate degree  Right hip fracture -Status post operative management -Continue pain management -PT/OT -originally from ALF, but will need SNF placement on discharge -lovenox for DVT prophylaxis with plans to switch to Eliquis on discharge, per ortho -Stable for discharge to skilled nursing facility  Dementia -Suspect worsening delirium related to hip fracture/hospital appointment -Currently mental status appears to be at baseline -Continue on Aricept -Continue Seroquel, Depakote  Hypertension -Continue home medications -Hold beta-blocker in light of bradycardia  Sinus bradycardia -Suspect related to recent beta-blockers -She is asymptomatic -TSH normal   DVT prophylaxis: enoxaparin (LOVENOX) injection 30 mg Start: 03/13/22 0800 SCDs Start: 03/12/22 1844  Code Status: Full code Family Communication: No family present, updated daughter over the phone Disposition Plan: Status is: Inpatient Remains inpatient appropriate because: Postop care, will likely need SNF placement     Consultants:  Orthopedics  Procedures:  Right hip hemiarthroplasty, anterior approach.   Antimicrobials:      Subjective: She is confused, denies any complaints  Objective: Vitals:   03/15/22 0630 03/15/22 1328 03/15/22 1537 03/15/22 2026  BP: 125/73 (!) 129/59 (!) 126/54 (!) 140/59  Pulse: 63 79 66 70  Resp: '18  18 16  '$ Temp: 98.4 F (36.9 C) (!) 97.5 F  (36.4 C) (!) 97.4 F (36.3 C) 98.6 F (37 C)  TempSrc: Oral Oral Oral Oral  SpO2: 92% 96% 95% 97%  Weight:      Height:        Intake/Output Summary (Last 24 hours) at 03/15/2022 2035 Last data filed at 03/15/2022 1800 Gross per 24 hour  Intake 474 ml  Output 900 ml  Net -426 ml   Filed Weights   03/12/22 0100  Weight: 59.4 kg    Examination:  General exam: laying in bed, no distress Respiratory system: Clear to auscultation. Respiratory effort normal. Cardiovascular system: S1 & S2 heard, RRR. No JVD, murmurs, rubs, gallops or clicks. No pedal edema. Gastrointestinal system: Abdomen is nondistended, soft and nontender. No organomegaly or masses felt. Normal bowel sounds heard. Central nervous system:  No focal neurological deficits. Extremities: Symmetric 5 x 5 power. Skin: No rashes, lesions or ulcers Psychiatry: confused    Data Reviewed: I have personally reviewed following labs and imaging studies  CBC: Recent Labs  Lab 03/11/22 1755 03/12/22 2111 03/15/22 0446  WBC 12.8* 14.6* 10.4  NEUTROABS 9.8*  --   --   HGB 13.9 13.2 10.4*  HCT 40.6 40.1 31.2*  MCV 91.9 93.9 95.1  PLT 192 173 528*   Basic Metabolic Panel: Recent Labs  Lab 03/11/22 1755 03/12/22 0114 03/12/22 1925 03/15/22 0446  NA 142 141  --  139  K 3.7 4.2  --  3.6  CL 111 109  --  111  CO2 23 22  --  23  GLUCOSE 125* 140*  --  89  BUN 16 15  --  18  CREATININE 0.99 0.95 0.83 0.66  CALCIUM 9.0 9.3  --  7.9*  MG  --  1.9  --   --    GFR: Estimated Creatinine Clearance: 41 mL/min (by C-G formula based on SCr of 0.66 mg/dL). Liver Function Tests: No results for input(s): "AST", "ALT", "ALKPHOS", "BILITOT", "PROT", "ALBUMIN" in the last 168 hours. No results for input(s): "LIPASE", "AMYLASE" in the last 168 hours. No results for input(s): "AMMONIA" in the last 168 hours. Coagulation Profile: Recent Labs  Lab 03/11/22 1755  INR 1.0   Cardiac Enzymes: No results for input(s):  "CKTOTAL", "CKMB", "CKMBINDEX", "TROPONINI" in the last 168 hours. BNP (last 3 results) No results for input(s): "PROBNP" in the last 8760 hours. HbA1C: No results for input(s): "HGBA1C" in the last 72 hours. CBG: No results for input(s): "GLUCAP" in the last 168 hours. Lipid Profile: No results for input(s): "CHOL", "HDL", "LDLCALC", "TRIG", "CHOLHDL", "LDLDIRECT" in the last 72 hours. Thyroid Function Tests: Recent Labs    03/15/22 0446  TSH 1.725   Anemia Panel: No results for input(s): "VITAMINB12", "FOLATE", "FERRITIN", "TIBC", "IRON", "RETICCTPCT" in the last 72 hours. Sepsis Labs: No results for input(s): "PROCALCITON", "LATICACIDVEN" in the last 168 hours.  Recent Results (from the past 240 hour(s))  MRSA Next Gen by PCR, Nasal     Status: None   Collection Time: 03/12/22  8:06 AM   Specimen: Nasal Mucosa; Nasal Swab  Result Value Ref Range Status   MRSA by PCR Next Gen NOT DETECTED NOT DETECTED Final    Comment: (NOTE) The GeneXpert MRSA Assay (FDA approved for NASAL specimens only), is one component of a comprehensive MRSA colonization surveillance program. It is not intended to diagnose MRSA infection nor to guide or monitor treatment for MRSA infections. Test performance is not FDA approved in patients less than 24 years old. Performed at [facility], [place] [address], [place], [place] [address]          Radiology Studies: No results found.      Scheduled Meds:  amLODipine  10 mg Oral Daily   calcium-vitamin D  1 tablet Oral Q breakfast   divalproex  125 mg Oral TID   docusate sodium  100 mg Oral BID   donepezil  5 mg Oral QHS   enoxaparin (LOVENOX) injection  30 mg Subcutaneous Q24H   feeding supplement  237 mL Oral BID BM   lisinopril  40 mg Oral Daily   melatonin  3 mg Oral QHS   multivitamin with minerals  1 tablet Oral Daily   PARoxetine  20 mg Oral Daily   QUEtiapine  25 mg Oral QHS   senna  1 tablet Oral BID    Continuous Infusions:  methocarbamol (ROBAXIN) IV       LOS: 4 days    Time spent: 86mns    JKathie Dike MD [facility]   If 7PM-7AM, please contact night-coverage [email or web address]  03/15/2022, 8:35 PM

## 2022-03-16 DIAGNOSIS — I10 Essential (primary) hypertension: Secondary | ICD-10-CM | POA: Diagnosis not present

## 2022-03-16 DIAGNOSIS — S72001A Fracture of unspecified part of neck of right femur, initial encounter for closed fracture: Secondary | ICD-10-CM | POA: Diagnosis not present

## 2022-03-16 DIAGNOSIS — E440 Moderate protein-calorie malnutrition: Secondary | ICD-10-CM | POA: Diagnosis not present

## 2022-03-16 DIAGNOSIS — F0390 Unspecified dementia without behavioral disturbance: Secondary | ICD-10-CM | POA: Diagnosis not present

## 2022-03-16 NOTE — NC FL2 (Signed)
[place] LEVEL OF CARE SCREENING TOOL     IDENTIFICATION  Patient Name: Suzanne Monroe Birthdate: 10/03/1932 Sex: female Admission Date (Current Location): 03/11/2022  [facility] and [place] Number:  Herbalist and Address:  [facility],  [place], [place]      Provider Number: [id number]  Attending Physician Name and Address:  Kathie Dike, MD  Relative Name and Phone Number:   Faye Ramsay dtr [phone])    Current Level of Care: Hospital Recommended Level of Care: [place] Prior Approval Number:    Date Approved/Denied:   PASRR Number:  [id number])  Discharge Plan: SNF    Current Diagnoses: Patient Active Problem List   Diagnosis Date Noted   Malnutrition of moderate degree 03/12/2022   Closed right hip fracture, initial encounter ([place]) 03/11/2022   Dementia without behavioral disturbance ([place]) 03/11/2022   Acute encephalopathy 03/11/2022   UTI (urinary tract infection) 02/06/2021   Leukocytosis 02/06/2021   AKI (acute kidney injury) ([place]) 02/06/2021   Essential hypertension 10/28/2014   Hyperlipidemia 10/28/2014   Central retinal vein occlusion 10/28/2014   Irregular heartbeat 10/28/2014   Fibromyalgia 05/15/2013   Low TSH level 05/07/2013   Memory loss 05/07/2013   Hypokalemia 05/07/2013   Frequent falls 05/07/2013   Anxiety and depression 10/30/2007   DYSPEPSIA 10/30/2007   HYPERCHOLESTEROLEMIA, PURE 03/30/2007   ICHTHYOSIS 03/29/2007   MIGRAINE, CHRONIC 11/03/2006   Irritable bowel syndrome 11/03/2006   OSTEOARTHRITIS 11/03/2006   POLYMYALGIA RHEUMATICA 11/03/2006   PERIPHERAL NEUROPATHY 12/21/2000   COLITIS, ULCERATIVE NOS 08/23/1993    Orientation RESPIRATION BLADDER Height & Weight     Self, Situation  Normal Incontinent Weight: 59.4 kg Height:  '5\' 3"'$  (160 cm)  BEHAVIORAL SYMPTOMS/MOOD NEUROLOGICAL BOWEL NUTRITION STATUS      Incontinent Diet (Regular)  AMBULATORY  STATUS COMMUNICATION OF NEEDS Skin   Limited Assist Verbally Surgical wounds (R hip surgical incision)                       Personal Care Assistance Level of Assistance  Bathing, Feeding, Dressing Bathing Assistance: Limited assistance Feeding assistance: Limited assistance Dressing Assistance: Limited assistance     Functional Limitations Info  Sight, Hearing, Speech Sight Info: Impaired (eyeglasses) Hearing Info: Adequate Speech Info: Adequate    SPECIAL CARE FACTORS FREQUENCY  PT (By licensed PT), OT (By licensed OT)     PT Frequency:  (5x week) OT Frequency:  (5x week)            Contractures Contractures Info: Not present    Additional Factors Info  Code Status, Allergies, Psychotropic Code Status Info:  (Full) Allergies Info:  (Ampicillin, Cefaclor, Codeine, Duloxetine, Fexofenadine, Ibuprofen, Loratadine, Nsaids) Psychotropic Info:  (Depakote;Seroquel)         Current Medications (03/16/2022):  This is the current hospital active medication list Current Facility-Administered Medications  Medication Dose Route Frequency Provider Last Rate Last Admin   acetaminophen (TYLENOL) tablet 325-650 mg  325-650 mg Oral Q6H PRN Swinteck, Aaron Edelman, MD       amLODipine (NORVASC) tablet 10 mg  10 mg Oral Daily Swinteck, Aaron Edelman, MD   10 mg at 03/16/22 1010   calcium-vitamin D (OSCAL WITH D) 500-5 MG-MCG per tablet 1 tablet  1 tablet Oral Q breakfast Rod Can, MD   1 tablet at 03/16/22 1011   divalproex (DEPAKOTE SPRINKLE) capsule 125 mg  125 mg Oral TID Rod Can, MD  125 mg at 03/16/22 1011   docusate sodium (COLACE) capsule 100 mg  100 mg Oral BID Rod Can, MD   100 mg at 03/16/22 1011   donepezil (ARICEPT) tablet 5 mg  5 mg Oral QHS Swinteck, Aaron Edelman, MD   5 mg at 03/15/22 2258   enoxaparin (LOVENOX) injection 30 mg  30 mg Subcutaneous Q24H Swinteck, Aaron Edelman, MD   30 mg at 03/16/22 1011   feeding supplement (ENSURE ENLIVE / ENSURE PLUS) liquid 237 mL  237  mL Oral BID BM Swinteck, Aaron Edelman, MD   237 mL at 03/16/22 1012   haloperidol lactate (HALDOL) injection 2-4 mg  2-4 mg Intravenous Q6H PRN Swinteck, Aaron Edelman, MD       HYDROcodone-acetaminophen (NORCO) 7.5-325 MG per tablet 1-2 tablet  1-2 tablet Oral Q4H PRN Swinteck, Aaron Edelman, MD       HYDROcodone-acetaminophen (NORCO/VICODIN) 5-325 MG per tablet 1-2 tablet  1-2 tablet Oral Q4H PRN Swinteck, Aaron Edelman, MD       lisinopril (ZESTRIL) tablet 40 mg  40 mg Oral Daily Swinteck, Aaron Edelman, MD   40 mg at 03/16/22 1011   magnesium hydroxide (MILK OF MAGNESIA) suspension 30 mL  30 mL Oral QHS PRN Swinteck, Aaron Edelman, MD       melatonin tablet 3 mg  3 mg Oral QHS Swinteck, Aaron Edelman, MD   3 mg at 03/15/22 2258   menthol-cetylpyridinium (CEPACOL) lozenge 3 mg  1 lozenge Oral PRN Swinteck, Aaron Edelman, MD       Or   phenol (CHLORASEPTIC) mouth spray 1 spray  1 spray Mouth/Throat PRN Swinteck, Aaron Edelman, MD       methocarbamol (ROBAXIN) tablet 500 mg  500 mg Oral Q6H PRN Rod Can, MD   500 mg at 03/14/22 2107   Or   methocarbamol (ROBAXIN) 500 mg in dextrose 5 % 50 mL IVPB  500 mg Intravenous Q6H PRN Swinteck, Aaron Edelman, MD       metoCLOPramide (REGLAN) tablet 5-10 mg  5-10 mg Oral Q8H PRN Swinteck, Aaron Edelman, MD       Or   metoCLOPramide (REGLAN) injection 5-10 mg  5-10 mg Intravenous Q8H PRN Swinteck, Aaron Edelman, MD       morphine (PF) 2 MG/ML injection 0.5-1 mg  0.5-1 mg Intravenous Q2H PRN Rod Can, MD   1 mg at 03/14/22 1020   multivitamin with minerals tablet 1 tablet  1 tablet Oral Daily Swinteck, Aaron Edelman, MD   1 tablet at 03/16/22 1011   ondansetron (ZOFRAN) tablet 4 mg  4 mg Oral Q6H PRN Swinteck, Aaron Edelman, MD       Or   ondansetron (ZOFRAN) injection 4 mg  4 mg Intravenous Q6H PRN Swinteck, Aaron Edelman, MD       PARoxetine (PAXIL) tablet 20 mg  20 mg Oral Daily Swinteck, Aaron Edelman, MD   20 mg at 03/16/22 1011   polyethylene glycol (MIRALAX / GLYCOLAX) packet 17 g  17 g Oral Daily PRN Swinteck, Aaron Edelman, MD       QUEtiapine (SEROQUEL) tablet 25  mg  25 mg Oral QHS Rod Can, MD   25 mg at 03/15/22 2258   senna (SENOKOT) tablet 8.6 mg  1 tablet Oral BID Rod Can, MD   8.6 mg at 03/16/22 1010     Discharge Medications: Please see discharge summary for a list of discharge medications.  Relevant Imaging Results:  Relevant Lab Results:   Additional Information SS# [id number];Moderna x2  Bilaal Leib, Juliann Pulse, RN

## 2022-03-16 NOTE — TOC Progression Note (Signed)
Transition of Care [facility]) - Progression Note    Patient Details  Name: Suzanne Monroe MRN: [id number] Date of Birth: Nov 21, 1932  [facility]) CM/SW Contact  Alaylah Heatherington, Juliann Pulse, RN Phone Number: 03/16/2022, 1:05 PM  Clinical Narrative: Received pasrr,added to fl2, bed offers given-await choice.     Expected Discharge Plan: [place] Barriers to Discharge: Continued Medical Work up  Expected Discharge Plan and Services Expected Discharge Plan: [place]   Discharge Planning Services: CM Consult Post Acute Care Choice: [place] Living arrangements for the past 2 months: Assisted Living Facility                                       Social Determinants of Health (SDOH) Interventions    Readmission Risk Interventions     No data to display

## 2022-03-16 NOTE — Progress Notes (Signed)
PROGRESS NOTE    Suzanne Monroe  EEF:[id number] DOB: June 24, 1933 DOA: 03/11/2022 PCP: Patient, No Pcp Per    Brief Narrative:  88 year old female with history of dementia, admitted to the hospital after an unwitnessed fall.  Found to have right-sided hip fracture.  Admitted for operative management.   Assessment & Plan:   Principal Problem:   Closed right hip fracture, initial encounter ([place]) Active Problems:   Dementia without behavioral disturbance (HCC)   Acute encephalopathy   Essential hypertension   Malnutrition of moderate degree  Right hip fracture -Status post operative management -Continue pain management -PT/OT -originally from ALF, but will need SNF placement on discharge -lovenox for DVT prophylaxis with plans to switch to Eliquis on discharge, per ortho -Stable for discharge to skilled nursing facility  Dementia -Suspect worsening delirium related to hip fracture/hospital appointment -Currently mental status appears to be at baseline -Continue on Aricept -Continue Seroquel, Depakote  Hypertension -Continue home medications -Hold beta-blocker in light of bradycardia  Sinus bradycardia -Suspect related to recent beta-blockers -She is asymptomatic -TSH normal  Somnolence -Daughter reports that patient has been sleeping more -I suspect this is related to her sundowning/agitation overnight resulting in daytime somnolence -Check UA   DVT prophylaxis: enoxaparin (LOVENOX) injection 30 mg Start: 03/13/22 0800 SCDs Start: 03/12/22 1844  Code Status: Full code Family Communication: No family present, updated daughter over the phone 7/25 Disposition Plan: Status is: Inpatient Remains inpatient appropriate because: Postop care, will likely need SNF placement     Consultants:  Orthopedics  Procedures:  Right hip hemiarthroplasty, anterior approach.   Antimicrobials:      Subjective: She is sleeping on my arrival, but easily wakes up to voice.   Denies any significant pain.  Objective: Vitals:   03/15/22 1537 03/15/22 2026 03/16/22 0548 03/16/22 1319  BP: (!) 126/54 (!) 140/59 (!) 126/55 115/63  Pulse: 66 70 68 71  Resp: '18 16 15 20  '$ Temp: (!) 97.4 F (36.3 C) 98.6 F (37 C) 98.2 F (36.8 C) 97.8 F (36.6 C)  TempSrc: Oral Oral Oral Oral  SpO2: 95% 97% 92% 96%  Weight:      Height:        Intake/Output Summary (Last 24 hours) at 03/16/2022 1930 Last data filed at 03/16/2022 1843 Gross per 24 hour  Intake 480 ml  Output 925 ml  Net -445 ml   Filed Weights   03/12/22 0100  Weight: 59.4 kg    Examination:  General exam: laying in bed, no distress Respiratory system: Clear to auscultation. Respiratory effort normal. Cardiovascular system: S1 & S2 heard, RRR. No JVD, murmurs, rubs, gallops or clicks. No pedal edema. Gastrointestinal system: Abdomen is nondistended, soft and nontender. No organomegaly or masses felt. Normal bowel sounds heard. Central nervous system:  No focal neurological deficits. Extremities: Symmetric 5 x 5 power. Skin: No rashes, lesions or ulcers Psychiatry: confused    Data Reviewed: I have personally reviewed following labs and imaging studies  CBC: Recent Labs  Lab 03/11/22 1755 03/12/22 2111 03/15/22 0446  WBC 12.8* 14.6* 10.4  NEUTROABS 9.8*  --   --   HGB 13.9 13.2 10.4*  HCT 40.6 40.1 31.2*  MCV 91.9 93.9 95.1  PLT 192 173 883*   Basic Metabolic Panel: Recent Labs  Lab 03/11/22 1755 03/12/22 0114 03/12/22 1925 03/15/22 0446  NA 142 141  --  139  K 3.7 4.2  --  3.6  CL 111 109  --  111  CO2 23  22  --  23  GLUCOSE 125* 140*  --  89  BUN 16 15  --  18  CREATININE 0.99 0.95 0.83 0.66  CALCIUM 9.0 9.3  --  7.9*  MG  --  1.9  --   --    GFR: Estimated Creatinine Clearance: 41 mL/min (by C-G formula based on SCr of 0.66 mg/dL). Liver Function Tests: No results for input(s): "AST", "ALT", "ALKPHOS", "BILITOT", "PROT", "ALBUMIN" in the last 168 hours. No results for  input(s): "LIPASE", "AMYLASE" in the last 168 hours. No results for input(s): "AMMONIA" in the last 168 hours. Coagulation Profile: Recent Labs  Lab 03/11/22 1755  INR 1.0   Cardiac Enzymes: No results for input(s): "CKTOTAL", "CKMB", "CKMBINDEX", "TROPONINI" in the last 168 hours. BNP (last 3 results) No results for input(s): "PROBNP" in the last 8760 hours. HbA1C: No results for input(s): "HGBA1C" in the last 72 hours. CBG: No results for input(s): "GLUCAP" in the last 168 hours. Lipid Profile: No results for input(s): "CHOL", "HDL", "LDLCALC", "TRIG", "CHOLHDL", "LDLDIRECT" in the last 72 hours. Thyroid Function Tests: Recent Labs    03/15/22 0446  TSH 1.725   Anemia Panel: No results for input(s): "VITAMINB12", "FOLATE", "FERRITIN", "TIBC", "IRON", "RETICCTPCT" in the last 72 hours. Sepsis Labs: No results for input(s): "PROCALCITON", "LATICACIDVEN" in the last 168 hours.  Recent Results (from the past 240 hour(s))  MRSA Next Gen by PCR, Nasal     Status: None   Collection Time: 03/12/22  8:06 AM   Specimen: Nasal Mucosa; Nasal Swab  Result Value Ref Range Status   MRSA by PCR Next Gen NOT DETECTED NOT DETECTED Final    Comment: (NOTE) The GeneXpert MRSA Assay (FDA approved for NASAL specimens only), is one component of a comprehensive MRSA colonization surveillance program. It is not intended to diagnose MRSA infection nor to guide or monitor treatment for MRSA infections. Test performance is not FDA approved in patients less than 36 years old. Performed at [facility], [place] [address], [place], [place] [address]          Radiology Studies: No results found.      Scheduled Meds:  amLODipine  10 mg Oral Daily   calcium-vitamin D  1 tablet Oral Q breakfast   divalproex  125 mg Oral TID   docusate sodium  100 mg Oral BID   donepezil  5 mg Oral QHS   enoxaparin (LOVENOX) injection  30 mg Subcutaneous Q24H   feeding supplement  237 mL  Oral BID BM   lisinopril  40 mg Oral Daily   melatonin  3 mg Oral QHS   multivitamin with minerals  1 tablet Oral Daily   PARoxetine  20 mg Oral Daily   QUEtiapine  25 mg Oral QHS   senna  1 tablet Oral BID   Continuous Infusions:  methocarbamol (ROBAXIN) IV       LOS: 5 days    Time spent: 67mns    JKathie Dike MD [facility]   If 7PM-7AM, please contact night-coverage [email or web address]  03/16/2022, 7:30 PM

## 2022-03-16 NOTE — Care Management Important Message (Signed)
Important Message  Patient Details IM Letter placed in Patients room. Name: Suzanne Monroe MRN: [id number] Date of Birth: [birth year]   Medicare Important Message Given:  Yes     Kerin Salen 03/16/2022, 3:04 PM

## 2022-03-17 DIAGNOSIS — S72001A Fracture of unspecified part of neck of right femur, initial encounter for closed fracture: Secondary | ICD-10-CM | POA: Diagnosis not present

## 2022-03-17 DIAGNOSIS — I10 Essential (primary) hypertension: Secondary | ICD-10-CM | POA: Diagnosis not present

## 2022-03-17 DIAGNOSIS — F0390 Unspecified dementia without behavioral disturbance: Secondary | ICD-10-CM | POA: Diagnosis not present

## 2022-03-17 NOTE — Discharge Summary (Addendum)
Physician Discharge Summary  Suzanne Monroe EXB:[id number] DOB: 27-Jul-1933 DOA: 03/11/2022  PCP: Patient, No Pcp Per  Admit date: 03/11/2022 Discharge date: 03/18/2022  Admitted From: Home Disposition: SNF  Recommendations for Outpatient Follow-up:  Follow up with SNF provider at earliest convenience Outpatient follow-up with orthopedics.  Discharge wound care/pain management/DVT prophylaxis as per orthopedics recommendations.  Activity as per orthopedic/PT recommendations Recommend outpatient evaluation and follow-up with palliative care for goals of care discussion Follow up in ED if symptoms worsen or new appear  Addendum on 03/18/2022: Patient is waiting to be discharged to SNF.  Currently medically stable for discharge.    Home Health: No Equipment/Devices: None  Discharge Condition: Stable CODE STATUS: Full Diet recommendation: Heart healthy  Brief/Interim Summary: 88 year old female with history of hypertension, dementia presented with an unwitnessed fall and was found to have right-sided hip fracture.  She underwent surgical intervention.  Postoperatively, PT recommended SNF placement.  She is currently medically stable for discharge.  She will be discharged to SNF once bed is available.  Discharge pain management/DVT prophylaxis/wound care/activity as per orthopedics recommendations.  Discharge Diagnoses:   Right hip fracture -Status post operative management with right hip hemiarthroplasty on 03/12/2022 - Postoperatively, PT recommended SNF placement.  She is currently medically stable for discharge.  She will be discharged to SNF once bed is available.  Discharge pain management/DVT prophylaxis/wound care/activity as per orthopedics recommendations.  Orthopedics recommends Eliquis on discharge.  Outpatient follow-up with orthopedics.   Dementia -Suspect worsening delirium related to hip fracture/hospital appointment -Currently mental status appears to be at  baseline -Continue on Aricept -Continue Seroquel, Depakote -Outpatient follow-up with PCP/neurology.  Recommend outpatient evaluation and follow-up with palliative care for goals of care discussion.   Hypertension -Continue home medications -Hold beta-blocker in light of bradycardia   Sinus bradycardia -Suspect related to recent beta-blockers -She is asymptomatic -TSH normal -Propanolol to remain on hold on discharge.   Somnolence -Daughter reports that patient has been sleeping more -I suspect this is related to her sundowning/agitation overnight resulting in daytime somnolence -Continue monitoring at SNF on discharge  Discharge Instructions  Discharge Instructions     Amb Referral to Palliative Care   Complete by: As directed    Diet - low sodium heart healthy   Complete by: As directed    Discharge wound care:   Complete by: As directed    As per orthopedics recommendations   Increase activity slowly   Complete by: As directed       Allergies as of 03/17/2022       Reactions   Ampicillin    REACTION: reaction not known   Cefaclor    REACTION: diarrhea   Codeine    REACTION: diarrhea   Duloxetine    REACTION: severe diarrhea   Fexofenadine    REACTION: fuzzy   Ibuprofen    REACTION: GI   Loratadine    REACTION: blurred vision   Nsaids    REACTION: fatigue        Medication List     STOP taking these medications    haloperidol 5 MG tablet Commonly known as: HALDOL   loperamide 2 MG tablet Commonly known as: IMODIUM A-D   propranolol 60 MG tablet Commonly known as: INDERAL       TAKE these medications    acetaminophen 500 MG tablet Commonly known as: TYLENOL Take 1 tablet (500 mg total) by mouth every 6 (six) hours as needed for moderate pain.   amLODipine 10  MG tablet Commonly known as: NORVASC Take 1 tablet (10 mg total) by mouth daily.   apixaban 2.5 MG Tabs tablet Commonly known as: Eliquis Take 1 tablet (2.5 mg total) by  mouth 2 (two) times daily.   Aquaphor Adv Protect Healing 41 % Oint Apply 1 Application topically daily.   Calcium Carbonate-Vitamin D 600-400 MG-UNIT tablet Take 1 tablet by mouth daily.   divalproex 125 MG capsule Commonly known as: DEPAKOTE SPRINKLE Take 125 mg by mouth 3 (three) times daily.   donepezil 5 MG tablet Commonly known as: ARICEPT Take 1 tablet (5 mg total) by mouth at bedtime.   Ensure Original Liqd Take 237 mLs by mouth 3 (three) times daily.   furosemide 20 MG tablet Commonly known as: LASIX Take 1 tablet (20 mg total) by mouth every other day.   guaifenesin 100 MG/5ML syrup Commonly known as: ROBITUSSIN Take 10 mLs (200 mg total) by mouth every 6 (six) hours as needed for cough.   HYDROcodone-acetaminophen 5-325 MG tablet Commonly known as: NORCO/VICODIN Take 1 tablet by mouth every 4 (four) hours as needed for moderate pain or severe pain.   lisinopril 40 MG tablet Commonly known as: ZESTRIL Take 40 mg by mouth daily.   magnesium hydroxide 400 MG/5ML suspension Commonly known as: MILK OF MAGNESIA Take 30 mLs by mouth at bedtime as needed for mild constipation.   melatonin 3 MG Tabs tablet Take 1 tablet (3 mg total) by mouth at bedtime.   Minerin Creme Crea Apply 1 application topically as needed (dry skin).   neomycin-bacitracin-polymyxin [id number] ointment Apply 1 application topically daily as needed (minor skin tears/ abrasions).   PARoxetine 20 MG tablet Commonly known as: PAXIL TAKE 1/2 TABLET BY MOUTH EVERY MORNING AND 1/2 TABLET EACH EVENING What changed:  how much to take how to take this when to take this additional instructions   Psyllium Husk 100 % Powd Take 1 packet by mouth daily as needed (For diarrhea).   QUEtiapine 25 MG tablet Commonly known as: SEROQUEL Take 1 tablet (25 mg total) by mouth at bedtime.   zinc oxide 20 % ointment Apply 1 Application topically daily.               Discharge Care  Instructions  (From admission, onward)           Start     Ordered   03/17/22 0000  Discharge wound care:       Comments: As per orthopedics recommendations   03/17/22 1016              Follow-up Information     Swinteck, Aaron Edelman, MD. Schedule an appointment as soon as possible for a visit in 2 week(s).   Specialty: Orthopedic Surgery Why: For suture removal, For wound re-check Contact information: [address] [place] [address] [phone]                Allergies  Allergen Reactions   Ampicillin     REACTION: reaction not known   Cefaclor     REACTION: diarrhea   Codeine     REACTION: diarrhea   Duloxetine     REACTION: severe diarrhea   Fexofenadine     REACTION: fuzzy   Ibuprofen     REACTION: GI   Loratadine     REACTION: blurred vision   Nsaids     REACTION: fatigue    Consultations: Orthopedics   Procedures/Studies: Pelvis Portable  Result Date: 03/12/2022 CLINICAL DATA:  Status post right hip arthroplasty EXAM: PORTABLE PELVIS 1-2 VIEWS COMPARISON:  None Available. FINDINGS: Status post right hip total arthroplasty with expected overlying postoperative change. No evidence of perihardware fracture or component malpositioning. IMPRESSION: Status post right hip total arthroplasty with expected overlying postoperative change. No evidence of perihardware fracture or component malpositioning. Electronically Signed   By: Delanna Ahmadi M.D.   On: 03/12/2022 18:30   DG HIP UNILAT WITH PELVIS 1V RIGHT  Result Date: 03/12/2022 CLINICAL DATA:  Right total hip replacement EXAM: DG HIP (WITH OR WITHOUT PELVIS) 1V RIGHT COMPARISON:  03/11/2022 FINDINGS: Intraoperative fluoroscopy was obtained for surgical control purposes. Fluoroscopy time recorded at 6 seconds. Dose 0.6678 mGy. Four spot fluoroscopic images are submitted. Spot fluoroscopic images demonstrate initially a fracture of the right femoral neck. Subsequent images demonstrate  placement of a right hip arthroplasty. Component is incompletely visualized. IMPRESSION: Intraoperative fluoroscopy obtained for surgical control purposes demonstrating right hip arthroplasty. Electronically Signed   By: Lucienne Capers M.D.   On: 03/12/2022 17:08   DG C-Arm 1-60 Min-No Report  Result Date: 03/12/2022 Fluoroscopy was utilized by the requesting physician.  No radiographic interpretation.   DG C-Arm 1-60 Min-No Report  Result Date: 03/12/2022 Fluoroscopy was utilized by the requesting physician.  No radiographic interpretation.   DG Knee Right Port  Result Date: 03/12/2022 CLINICAL DATA:  Right femur fracture. EXAM: PORTABLE RIGHT KNEE - 1-2 VIEW COMPARISON:  Right knee x-rays dated June 27, 2008. FINDINGS: No evidence of fracture, dislocation, or joint effusion. No evidence of arthropathy or other focal bone abnormality. Soft tissues are unremarkable. IMPRESSION: Negative. Electronically Signed   By: Titus Dubin M.D.   On: 03/12/2022 09:03   DG Hip Unilat With Pelvis 2-3 Views Right  Result Date: 03/11/2022 CLINICAL DATA:  Fall EXAM: DG HIP (WITH OR WITHOUT PELVIS) 2-3V RIGHT COMPARISON:  No prior hip radiographs, correlation is made with CT abdomen pelvis 02/06/2021 FINDINGS: Impacted, foreshortened fracture of the right femoral neck. No other fracture is seen in the pelvis or left hip degenerative changes in the lumbar spine. Nonobstructive bowel-gas pattern. IMPRESSION: Right femoral neck fracture. Electronically Signed   By: Merilyn Baba M.D.   On: 03/11/2022 18:27   DG Chest 1 View  Result Date: 03/11/2022 CLINICAL DATA:  Un witnessed fall, right hip pain EXAM: CHEST  1 VIEW COMPARISON:  02/09/2021 FINDINGS: Single frontal view of the chest demonstrates an unremarkable cardiac silhouette. No airspace disease, effusion, or pneumothorax. Left shoulder arthroplasty. No acute fractures. IMPRESSION: 1. No acute intrathoracic process. Electronically Signed   By: Randa Ngo M.D.   On: 03/11/2022 18:26      Subjective: Patient seen and examined at bedside.  No overnight fever, vomiting or agitation reported.  Discharge Exam: Vitals:   03/16/22 1952 03/17/22 0623  BP: 129/66 116/63  Pulse: 79 79  Resp: 16 18  Temp: 98.6 F (37 C) 98.1 F (36.7 C)  SpO2: 95% 94%    General: Pt is sleepy, wakes up slightly, poor historian and confused.  Currently on room air.   Cardiovascular: rate controlled, S1/S2 + Respiratory: bilateral decreased breath sounds at bases Abdominal: Soft, NT, ND, bowel sounds + Extremities: Trace lower extremity edema; no cyanosis    The results of significant diagnostics from this hospitalization (including imaging, microbiology, ancillary and laboratory) are listed below for reference.     Microbiology: Recent Results (from the past 240 hour(s))  MRSA Next Gen by PCR, Nasal     Status:  None   Collection Time: 03/12/22  8:06 AM   Specimen: Nasal Mucosa; Nasal Swab  Result Value Ref Range Status   MRSA by PCR Next Gen NOT DETECTED NOT DETECTED Final    Comment: (NOTE) The GeneXpert MRSA Assay (FDA approved for NASAL specimens only), is one component of a comprehensive MRSA colonization surveillance program. It is not intended to diagnose MRSA infection nor to guide or monitor treatment for MRSA infections. Test performance is not FDA approved in patients less than 31 years old. Performed at [facility], [place] [address], [place], [place] [address]      Labs: BNP (last 3 results) No results for input(s): "BNP" in the last 8760 hours. Basic Metabolic Panel: Recent Labs  Lab 03/11/22 1755 03/12/22 0114 03/12/22 1925 03/15/22 0446  NA 142 141  --  139  K 3.7 4.2  --  3.6  CL 111 109  --  111  CO2 23 22  --  23  GLUCOSE 125* 140*  --  89  BUN 16 15  --  18  CREATININE 0.99 0.95 0.83 0.66  CALCIUM 9.0 9.3  --  7.9*  MG  --  1.9  --   --    Liver Function Tests: No results for input(s):  "AST", "ALT", "ALKPHOS", "BILITOT", "PROT", "ALBUMIN" in the last 168 hours. No results for input(s): "LIPASE", "AMYLASE" in the last 168 hours. No results for input(s): "AMMONIA" in the last 168 hours. CBC: Recent Labs  Lab 03/11/22 1755 03/12/22 2111 03/15/22 0446  WBC 12.8* 14.6* 10.4  NEUTROABS 9.8*  --   --   HGB 13.9 13.2 10.4*  HCT 40.6 40.1 31.2*  MCV 91.9 93.9 95.1  PLT 192 173 143*   Cardiac Enzymes: No results for input(s): "CKTOTAL", "CKMB", "CKMBINDEX", "TROPONINI" in the last 168 hours. BNP: Invalid input(s): "POCBNP" CBG: No results for input(s): "GLUCAP" in the last 168 hours. D-Dimer No results for input(s): "DDIMER" in the last 72 hours. Hgb A1c No results for input(s): "HGBA1C" in the last 72 hours. Lipid Profile No results for input(s): "CHOL", "HDL", "LDLCALC", "TRIG", "CHOLHDL", "LDLDIRECT" in the last 72 hours. Thyroid function studies Recent Labs    03/15/22 0446  TSH 1.725   Anemia work up No results for input(s): "VITAMINB12", "FOLATE", "FERRITIN", "TIBC", "IRON", "RETICCTPCT" in the last 72 hours. Urinalysis    Component Value Date/Time   COLORURINE YELLOW 02/06/2021 1330   APPEARANCEUR HAZY (A) 02/06/2021 1330   LABSPEC 1.014 02/06/2021 1330   PHURINE 5.0 02/06/2021 1330   GLUCOSEU NEGATIVE 02/06/2021 1330   HGBUR LARGE (A) 02/06/2021 1330   HGBUR negative 09/12/2009 1454   BILIRUBINUR NEGATIVE 02/06/2021 1330   KETONESUR NEGATIVE 02/06/2021 1330   PROTEINUR 30 (A) 02/06/2021 1330   UROBILINOGEN 0.2 09/12/2009 1454   NITRITE NEGATIVE 02/06/2021 1330   LEUKOCYTESUR LARGE (A) 02/06/2021 1330   Sepsis Labs Recent Labs  Lab 03/11/22 1755 03/12/22 2111 03/15/22 0446  WBC 12.8* 14.6* 10.4   Microbiology Recent Results (from the past 240 hour(s))  MRSA Next Gen by PCR, Nasal     Status: None   Collection Time: 03/12/22  8:06 AM   Specimen: Nasal Mucosa; Nasal Swab  Result Value Ref Range Status   MRSA by PCR Next Gen NOT DETECTED  NOT DETECTED Final    Comment: (NOTE) The GeneXpert MRSA Assay (FDA approved for NASAL specimens only), is one component of a comprehensive MRSA colonization surveillance program. It is not intended to diagnose MRSA  infection nor to guide or monitor treatment for MRSA infections. Test performance is not FDA approved in patients less than 105 years old. Performed at [facility], [place] [address], [place], [place] [address]      Time coordinating discharge: 35 minutes  SIGNED:   Aline August, MD  [facility] 03/17/2022, 10:16 AM

## 2022-03-17 NOTE — Progress Notes (Addendum)
RN reached out to receiving facility to provide report to nurse at this time. Receiving nurse was busy and took RN name and phone number to call back at a later time. RN awaits receiving nurse phone call.  At 1305, RN was able to give report to receiving facility nurse, Colletta Maryland.

## 2022-03-17 NOTE — TOC Transition Note (Addendum)
Transition of Care [facility]) - CM/SW Discharge Note   Patient Details  Name: Suzanne Monroe MRN: [id number] Date of Birth: October 22, 1932  Transition of Care [facility]) CM/SW Contact:  Dessa Phi, RN Phone Number: 03/17/2022, 10:33 AM   Clinical Narrative: [name] dtr chose [facility]-awaiting response back from [facility] if bed available today.  -10:42a-[facility] rep Maudie Mercury has bed available-awaiting rm#,tel# report prior PTAR. No further CM needs.  -11a-going to rm#[id number],report tel#[phone].PTAR called.No further CM needs.   Final next level of care: Skilled Nursing Facility Barriers to Discharge: No Barriers Identified   Patient Goals and CMS Choice Patient states their goals for this hospitalization and ongoing recovery are:: Rehab CMS Medicare.gov Compare Post Acute Care list provided to:: Patient Represenative (must comment) [name] dtr) Choice offered to / list presented to : Adult Children  Discharge Placement              Patient chooses bed at: Other - please specify in the comment section below: [facility]) Patient to be transferred to facility by:  Corey Harold) Name of family member notified:  [name] dtr) Patient and family notified of of transfer: 03/17/22  Discharge Plan and Services   Discharge Planning Services: CM Consult Post Acute Care Choice: [place]                               Social Determinants of Health (SDOH) Interventions     Readmission Risk Interventions     No data to display

## 2022-03-18 LAB — GLUCOSE, CAPILLARY: Glucose-Capillary: 98 mg/dL (ref 70–99)

## 2022-03-18 NOTE — Progress Notes (Signed)
[employer] [facility]) Hospital Liaison note:  Notified via workque from Dr. Aline August of request for [place] services. Will continue to follow for disposition.  Please call with any outpatient palliative questions or concerns.  Thank you for the opportunity to participate in this patient's care.  Thank you, Lorelee Market, LPN [facility] Liaison [phone]

## 2022-03-18 NOTE — Progress Notes (Signed)
Patient discharged to SNF, daughter- [name] notified. Patient denies any pain/distress.

## 2022-03-18 NOTE — TOC Transition Note (Signed)
Transition of Care [facility]) - CM/SW Discharge Note   Patient Details  Name: Suzanne Monroe MRN: [id number] Date of Birth: 28-Jan-1933  Transition of Care [facility]) CM/SW Contact:  Dessa Phi, RN Phone Number: 03/18/2022, 9:00 AM   Clinical Narrative:PTAR called again for p/u.[facility]-rep Kim aware.same rm#[id number],report tel#[phone].No further CM needs.       Final next level of care: Skilled Nursing Facility Barriers to Discharge: No Barriers Identified   Patient Goals and CMS Choice Patient states their goals for this hospitalization and ongoing recovery are:: Rehab CMS Medicare.gov Compare Post Acute Care list provided to:: Patient Represenative (must comment) [name] dtr) Choice offered to / list presented to : Adult Children  Discharge Placement              Patient chooses bed at: Other - please specify in the comment section below: [facility]) Patient to be transferred to facility by:  Corey Harold) Name of family member notified:  [name] dtr) Patient and family notified of of transfer: 03/17/22  Discharge Plan and Services   Discharge Planning Services: CM Consult Post Acute Care Choice: [address]                               Social Determinants of Health (SDOH) Interventions     Readmission Risk Interventions     No data to display

## 2022-03-18 NOTE — Progress Notes (Signed)
Patient is waiting to be discharged to SNF.  Currently medically stable for discharge.  Please refer to the full discharge summary done by me on 03/17/2022 for full details.  Patient seen and examined at bedside.

## 2022-03-27 ENCOUNTER — Emergency Department (HOSPITAL_COMMUNITY): Payer: Medicare Other

## 2022-03-27 ENCOUNTER — Inpatient Hospital Stay (HOSPITAL_COMMUNITY)
Admission: EM | Admit: 2022-03-27 | Discharge: 2022-03-29 | DRG: 377 | Disposition: A | Discharge status: Hospice | Payer: Medicare Other | Attending: Internal Medicine | Admitting: Internal Medicine

## 2022-03-27 DIAGNOSIS — Z881 Allergy status to other antibiotic agents status: Secondary | ICD-10-CM

## 2022-03-27 DIAGNOSIS — Z7901 Long term (current) use of anticoagulants: Secondary | ICD-10-CM

## 2022-03-27 DIAGNOSIS — Z888 Allergy status to other drugs, medicaments and biological substances status: Secondary | ICD-10-CM | POA: Diagnosis not present

## 2022-03-27 DIAGNOSIS — K922 Gastrointestinal hemorrhage, unspecified: Secondary | ICD-10-CM | POA: Diagnosis present

## 2022-03-27 DIAGNOSIS — D62 Acute posthemorrhagic anemia: Secondary | ICD-10-CM | POA: Diagnosis present

## 2022-03-27 DIAGNOSIS — N179 Acute kidney failure, unspecified: Secondary | ICD-10-CM | POA: Diagnosis present

## 2022-03-27 DIAGNOSIS — K625 Hemorrhage of anus and rectum: Principal | ICD-10-CM

## 2022-03-27 DIAGNOSIS — R578 Other shock: Secondary | ICD-10-CM | POA: Diagnosis present

## 2022-03-27 DIAGNOSIS — M1990 Unspecified osteoarthritis, unspecified site: Secondary | ICD-10-CM | POA: Diagnosis present

## 2022-03-27 DIAGNOSIS — Z823 Family history of stroke: Secondary | ICD-10-CM

## 2022-03-27 DIAGNOSIS — Z841 Family history of disorders of kidney and ureter: Secondary | ICD-10-CM

## 2022-03-27 DIAGNOSIS — L304 Erythema intertrigo: Secondary | ICD-10-CM | POA: Diagnosis present

## 2022-03-27 DIAGNOSIS — K921 Melena: Secondary | ICD-10-CM | POA: Diagnosis present

## 2022-03-27 DIAGNOSIS — Z515 Encounter for palliative care: Secondary | ICD-10-CM | POA: Diagnosis not present

## 2022-03-27 DIAGNOSIS — Z885 Allergy status to narcotic agent status: Secondary | ICD-10-CM | POA: Diagnosis not present

## 2022-03-27 DIAGNOSIS — Z79899 Other long term (current) drug therapy: Secondary | ICD-10-CM

## 2022-03-27 DIAGNOSIS — G629 Polyneuropathy, unspecified: Secondary | ICD-10-CM | POA: Diagnosis present

## 2022-03-27 DIAGNOSIS — Z66 Do not resuscitate: Secondary | ICD-10-CM | POA: Diagnosis present

## 2022-03-27 DIAGNOSIS — Z886 Allergy status to analgesic agent status: Secondary | ICD-10-CM | POA: Diagnosis not present

## 2022-03-27 DIAGNOSIS — Z7189 Other specified counseling: Secondary | ICD-10-CM | POA: Diagnosis not present

## 2022-03-27 DIAGNOSIS — R0902 Hypoxemia: Secondary | ICD-10-CM | POA: Diagnosis present

## 2022-03-27 DIAGNOSIS — Z811 Family history of alcohol abuse and dependence: Secondary | ICD-10-CM

## 2022-03-27 DIAGNOSIS — Z96612 Presence of left artificial shoulder joint: Secondary | ICD-10-CM | POA: Diagnosis present

## 2022-03-27 DIAGNOSIS — I10 Essential (primary) hypertension: Secondary | ICD-10-CM | POA: Diagnosis present

## 2022-03-27 DIAGNOSIS — E875 Hyperkalemia: Secondary | ICD-10-CM | POA: Diagnosis not present

## 2022-03-27 DIAGNOSIS — F0390 Unspecified dementia without behavioral disturbance: Secondary | ICD-10-CM | POA: Diagnosis present

## 2022-03-27 DIAGNOSIS — B372 Candidiasis of skin and nail: Secondary | ICD-10-CM | POA: Diagnosis present

## 2022-03-27 DIAGNOSIS — G43909 Migraine, unspecified, not intractable, without status migrainosus: Secondary | ICD-10-CM | POA: Diagnosis present

## 2022-03-27 DIAGNOSIS — H3530 Unspecified macular degeneration: Secondary | ICD-10-CM | POA: Diagnosis present

## 2022-03-27 DIAGNOSIS — Z96641 Presence of right artificial hip joint: Secondary | ICD-10-CM | POA: Diagnosis present

## 2022-03-27 DIAGNOSIS — Z8249 Family history of ischemic heart disease and other diseases of the circulatory system: Secondary | ICD-10-CM

## 2022-03-27 DIAGNOSIS — F419 Anxiety disorder, unspecified: Secondary | ICD-10-CM | POA: Diagnosis present

## 2022-03-27 DIAGNOSIS — I444 Left anterior fascicular block: Secondary | ICD-10-CM | POA: Diagnosis present

## 2022-03-27 DIAGNOSIS — Z800 Family history of malignant neoplasm of digestive organs: Secondary | ICD-10-CM

## 2022-03-27 DIAGNOSIS — E861 Hypovolemia: Secondary | ICD-10-CM | POA: Diagnosis not present

## 2022-03-27 DIAGNOSIS — I9589 Other hypotension: Secondary | ICD-10-CM | POA: Diagnosis not present

## 2022-03-27 DIAGNOSIS — Z87891 Personal history of nicotine dependence: Secondary | ICD-10-CM

## 2022-03-27 DIAGNOSIS — F32A Depression, unspecified: Secondary | ICD-10-CM | POA: Diagnosis present

## 2022-03-27 LAB — CBC WITH DIFFERENTIAL/PLATELET
Abs Immature Granulocytes: 0.09 10*3/uL — ABNORMAL HIGH (ref 0.00–0.07)
Basophils Absolute: 0.1 10*3/uL (ref 0.0–0.1)
Basophils Relative: 1 %
Eosinophils Absolute: 0.2 10*3/uL (ref 0.0–0.5)
Eosinophils Relative: 1 %
HCT: 27 % — ABNORMAL LOW (ref 36.0–46.0)
Hemoglobin: 8.7 g/dL — ABNORMAL LOW (ref 12.0–15.0)
Immature Granulocytes: 1 %
Lymphocytes Relative: 17 %
Lymphs Abs: 2.3 10*3/uL (ref 0.7–4.0)
MCH: 32.3 pg (ref 26.0–34.0)
MCHC: 32.2 g/dL (ref 30.0–36.0)
MCV: 100.4 fL — ABNORMAL HIGH (ref 80.0–100.0)
Monocytes Absolute: 0.9 10*3/uL (ref 0.1–1.0)
Monocytes Relative: 7 %
Neutro Abs: 9.8 10*3/uL — ABNORMAL HIGH (ref 1.7–7.7)
Neutrophils Relative %: 73 %
Platelets: 483 10*3/uL — ABNORMAL HIGH (ref 150–400)
RBC: 2.69 MIL/uL — ABNORMAL LOW (ref 3.87–5.11)
RDW: 13.7 % (ref 11.5–15.5)
WBC: 13.3 10*3/uL — ABNORMAL HIGH (ref 4.0–10.5)
nRBC: 0 % (ref 0.0–0.2)

## 2022-03-27 LAB — APTT: aPTT: 31 seconds (ref 24–36)

## 2022-03-27 LAB — BASIC METABOLIC PANEL
Anion gap: 6 (ref 5–15)
BUN: 41 mg/dL — ABNORMAL HIGH (ref 8–23)
CO2: 22 mmol/L (ref 22–32)
Calcium: 8.2 mg/dL — ABNORMAL LOW (ref 8.9–10.3)
Chloride: 113 mmol/L — ABNORMAL HIGH (ref 98–111)
Creatinine, Ser: 1.11 mg/dL — ABNORMAL HIGH (ref 0.44–1.00)
GFR, Estimated: 48 mL/min — ABNORMAL LOW (ref >60)
Glucose, Bld: 124 mg/dL — ABNORMAL HIGH (ref 70–99)
Potassium: 4.7 mmol/L (ref 3.5–5.1)
Sodium: 141 mmol/L (ref 135–145)

## 2022-03-27 LAB — PROTIME-INR
INR: 1.5 — ABNORMAL HIGH (ref 0.8–1.2)
Prothrombin Time: 18 seconds — ABNORMAL HIGH (ref 11.4–15.2)

## 2022-03-27 LAB — CBC
HCT: 33.3 % — ABNORMAL LOW (ref 36.0–46.0)
Hemoglobin: 11 g/dL — ABNORMAL LOW (ref 12.0–15.0)
MCH: 30.9 pg (ref 26.0–34.0)
MCHC: 33 g/dL (ref 30.0–36.0)
MCV: 93.5 fL (ref 80.0–100.0)
Platelets: 343 10*3/uL (ref 150–400)
RBC: 3.56 MIL/uL — ABNORMAL LOW (ref 3.87–5.11)
RDW: 15.8 % — ABNORMAL HIGH (ref 11.5–15.5)
WBC: 10.8 10*3/uL — ABNORMAL HIGH (ref 4.0–10.5)
nRBC: 0 % (ref 0.0–0.2)

## 2022-03-27 LAB — FERRITIN: Ferritin: 67 ng/mL (ref 11–307)

## 2022-03-27 LAB — PHOSPHORUS: Phosphorus: 3.2 mg/dL (ref 2.5–4.6)

## 2022-03-27 LAB — COMPREHENSIVE METABOLIC PANEL
ALT: 9 U/L (ref 0–44)
AST: 16 U/L (ref 15–41)
Albumin: 2.3 g/dL — ABNORMAL LOW (ref 3.5–5.0)
Alkaline Phosphatase: 79 U/L (ref 38–126)
Anion gap: 8 (ref 5–15)
BUN: 36 mg/dL — ABNORMAL HIGH (ref 8–23)
CO2: 22 mmol/L (ref 22–32)
Calcium: 8.2 mg/dL — ABNORMAL LOW (ref 8.9–10.3)
Chloride: 111 mmol/L (ref 98–111)
Creatinine, Ser: 1.28 mg/dL — ABNORMAL HIGH (ref 0.44–1.00)
GFR, Estimated: 41 mL/min — ABNORMAL LOW (ref >60)
Glucose, Bld: 145 mg/dL — ABNORMAL HIGH (ref 70–99)
Potassium: 5.1 mmol/L (ref 3.5–5.1)
Sodium: 141 mmol/L (ref 135–145)
Total Bilirubin: 0.7 mg/dL (ref 0.3–1.2)
Total Protein: 4.6 g/dL — ABNORMAL LOW (ref 6.5–8.1)

## 2022-03-27 LAB — IRON AND TIBC
Iron: 68 ug/dL (ref 28–170)
Saturation Ratios: 30 % (ref 10.4–31.8)
TIBC: 228 ug/dL — ABNORMAL LOW (ref 250–450)
UIBC: 160 ug/dL

## 2022-03-27 LAB — FOLATE: Folate: 13.7 ng/mL (ref >5.9)

## 2022-03-27 LAB — I-STAT CHEM 8, ED
BUN: 50 mg/dL — ABNORMAL HIGH (ref 8–23)
Calcium, Ion: 1.04 mmol/L — ABNORMAL LOW (ref 1.15–1.40)
Chloride: 109 mmol/L (ref 98–111)
Creatinine, Ser: 1.3 mg/dL — ABNORMAL HIGH (ref 0.44–1.00)
Glucose, Bld: 139 mg/dL — ABNORMAL HIGH (ref 70–99)
HCT: 24 % — ABNORMAL LOW (ref 36.0–46.0)
Hemoglobin: 8.2 g/dL — ABNORMAL LOW (ref 12.0–15.0)
Potassium: 5.9 mmol/L — ABNORMAL HIGH (ref 3.5–5.1)
Sodium: 138 mmol/L (ref 135–145)
TCO2: 22 mmol/L (ref 22–32)

## 2022-03-27 LAB — PREPARE RBC (CROSSMATCH)

## 2022-03-27 LAB — VITAMIN B12: Vitamin B-12: 1089 pg/mL — ABNORMAL HIGH (ref 180–914)

## 2022-03-27 LAB — RETICULOCYTES
Immature Retic Fract: 16.2 % — ABNORMAL HIGH (ref 2.3–15.9)
RBC.: 2.6 MIL/uL — ABNORMAL LOW (ref 3.87–5.11)
Retic Count, Absolute: 95.2 10*3/uL (ref 19.0–186.0)
Retic Ct Pct: 3.7 % — ABNORMAL HIGH (ref 0.4–3.1)

## 2022-03-27 LAB — MAGNESIUM: Magnesium: 2.1 mg/dL (ref 1.7–2.4)

## 2022-03-27 LAB — MRSA NEXT GEN BY PCR, NASAL: MRSA by PCR Next Gen: DETECTED — AB

## 2022-03-27 MED ORDER — POLYETHYLENE GLYCOL 3350 17 G PO PACK: 17.0000 g | PACK | Freq: Every day | ORAL | Status: DC | PRN

## 2022-03-27 MED ORDER — PANTOPRAZOLE 80MG IVPB - SIMPLE MED
80.0000 mg | Freq: Once | INTRAVENOUS | Status: AC
  Administered 2022-03-27: 80 mg via INTRAVENOUS
  Filled 2022-03-27: qty 80

## 2022-03-27 MED ORDER — CHLORHEXIDINE GLUCONATE CLOTH 2 % EX PADS
6.0000 | MEDICATED_PAD | Freq: Every day | CUTANEOUS | Status: DC
  Administered 2022-03-27: 6 via TOPICAL

## 2022-03-27 MED ORDER — DOCUSATE SODIUM 100 MG PO CAPS: 100.0000 mg | ORAL_CAPSULE | Freq: Two times a day (BID) | ORAL | Status: DC | PRN

## 2022-03-27 MED ORDER — SODIUM CHLORIDE 0.9 % IV SOLN
10.0000 mL/h | Freq: Once | INTRAVENOUS | Status: AC
  Administered 2022-03-27: 10 mL/h via INTRAVENOUS

## 2022-03-27 MED ORDER — SODIUM CHLORIDE 0.9 % IV SOLN: INTRAVENOUS | Status: AC

## 2022-03-27 MED ORDER — HALOPERIDOL 5 MG PO TABS
5.0000 mg | ORAL_TABLET | Freq: Three times a day (TID) | ORAL | Status: DC | PRN
Start: 2022-03-27 — End: 2022-03-28

## 2022-03-27 MED ORDER — PROTHROMBIN COMPLEX CONC HUMAN 500 UNITS IV KIT
2835.0000 [IU] | PACK | Status: AC
  Administered 2022-03-27: 2835 [IU] via INTRAVENOUS
  Filled 2022-03-27: qty 2835

## 2022-03-27 MED ORDER — GERHARDT'S BUTT CREAM
TOPICAL_CREAM | Freq: Three times a day (TID) | CUTANEOUS | Status: DC
  Filled 2022-03-27: qty 1

## 2022-03-27 MED ORDER — ORAL CARE MOUTH RINSE
15.0000 mL | OROMUCOSAL | Status: DC | PRN
Start: 2022-03-27 — End: 2022-03-30

## 2022-03-27 MED ORDER — DIVALPROEX SODIUM 125 MG PO CSDR
125.0000 mg | DELAYED_RELEASE_CAPSULE | Freq: Three times a day (TID) | ORAL | Status: DC
  Filled 2022-03-27 (×6): qty 1

## 2022-03-27 MED ORDER — MUPIROCIN 2 % EX OINT
1.0000 | TOPICAL_OINTMENT | Freq: Two times a day (BID) | CUTANEOUS | Status: DC
  Administered 2022-03-27 – 2022-03-29 (×4): 1 via NASAL
  Filled 2022-03-27: qty 22

## 2022-03-27 MED ORDER — QUETIAPINE FUMARATE 50 MG PO TABS
25.0000 mg | ORAL_TABLET | Freq: Every day | ORAL | Status: DC
  Administered 2022-03-27: 25 mg via ORAL
  Filled 2022-03-27 (×2): qty 1

## 2022-03-27 MED ORDER — PANTOPRAZOLE INFUSION (NEW) - SIMPLE MED
8.0000 mg/h | INTRAVENOUS | Status: DC
  Administered 2022-03-27 – 2022-03-29 (×4): 8 mg/h via INTRAVENOUS
  Filled 2022-03-27: qty 100
  Filled 2022-03-27 (×2): qty 80
  Filled 2022-03-27: qty 100
  Filled 2022-03-27 (×2): qty 80

## 2022-03-27 MED ORDER — DONEPEZIL HCL 5 MG PO TABS
5.0000 mg | ORAL_TABLET | Freq: Every day | ORAL | Status: DC
  Administered 2022-03-27: 5 mg via ORAL
  Filled 2022-03-27 (×3): qty 1

## 2022-03-27 MED ORDER — PANTOPRAZOLE SODIUM 40 MG IV SOLR: 40.0000 mg | Freq: Two times a day (BID) | INTRAVENOUS | Status: DC

## 2022-03-27 NOTE — ED Provider Notes (Signed)
[facility] EMERGENCY DEPARTMENT Provider Note   CSN: [id number] Arrival date & time: 03/27/22  1340     History  Chief Complaint  Patient presents with   GI Bleeding    Suzanne Monroe is a 88 y.o. female.  Suzanne Monroe is a 88 y.o. female history of dementia, hypertension, migraines, depression, PMR, who presents to the emergency department from [place] skilled nursing facility for reported "vaginal bleeding".  Per facility staff they went to check on patient to get her ready to come down for lunch and she was noted to be more confused than usual.  They went to change her and found that her diaper was full of blood, they assumed that this was vaginal bleeding.  Per facility she did not have blood in her diaper when she was first checked this morning.  They report that she was more pale and more lethargic than usual.  Patient was recently placed on Eliquis after recent right hip arthroplasty for DVT prophylaxis.  Last dose of Eliquis at 8:15 this morning.  No known history of prior GI bleeding. Patient noted to be hypotensive with systolic blood pressure in the 80s in route with EMS, hypoxic with O2 sats in 88-89% on room air, placed on 2 L nasal cannula with improvement.  The history is provided by the patient, the nursing home and the EMS personnel.       Home Medications Prior to Admission medications   Medication Sig Start Date End Date Taking? Authorizing Provider  acetaminophen (TYLENOL) 500 MG tablet Take 1 tablet (500 mg total) by mouth every 6 (six) hours as needed for moderate pain. 02/11/21  Yes Thurnell Lose, MD  amLODipine (NORVASC) 10 MG tablet Take 1 tablet (10 mg total) by mouth daily. 02/11/21  Yes Thurnell Lose, MD  apixaban (ELIQUIS) 2.5 MG TABS tablet Take 1 tablet (2.5 mg total) by mouth 2 (two) times daily. 03/15/22  Yes Hill, Marciano Sequin, PA-C  Calcium Carbonate-Vitamin D 600-400 MG-UNIT tablet Take 1 tablet by mouth daily. 02/11/21  Yes  Thurnell Lose, MD  divalproex (DEPAKOTE SPRINKLE) 125 MG capsule Take 125 mg by mouth 3 (three) times daily.   Yes [provider]  donepezil (ARICEPT) 5 MG tablet Take 1 tablet (5 mg total) by mouth at bedtime. 02/11/21  Yes Thurnell Lose, MD  Emollient (AQUAPHOR ADV PROTECT HEALING) 41 % OINT Apply 1 Application topically daily.   Yes [provider]  furosemide (LASIX) 20 MG tablet Take 1 tablet (20 mg total) by mouth every other day. 02/11/21  Yes Thurnell Lose, MD  guaifenesin (ROBITUSSIN) 100 MG/5ML syrup Take 10 mLs (200 mg total) by mouth every 6 (six) hours as needed for cough. 02/11/21  Yes Thurnell Lose, MD  haloperidol (HALDOL) 5 MG tablet Take 5 mg by mouth as needed for agitation.   Yes [provider]  lisinopril (ZESTRIL) 40 MG tablet Take 40 mg by mouth daily.   Yes [provider]  loperamide (IMODIUM) 2 MG capsule Take 2 mg by mouth as needed for diarrhea or loose stools.   Yes [provider]  magnesium hydroxide (MILK OF MAGNESIA) 400 MG/5ML suspension Take 30 mLs by mouth at bedtime as needed for mild constipation. 02/11/21  Yes Thurnell Lose, MD  melatonin 3 MG TABS tablet Take 1 tablet (3 mg total) by mouth at bedtime. 02/11/21  Yes Thurnell Lose, MD  neomycin-bacitracin-polymyxin (NEOSPORIN) [phone] ointment Apply  1 application topically daily as needed (minor skin tears/ abrasions). 02/11/21  Yes Thurnell Lose, MD  PARoxetine (PAXIL) 20 MG tablet TAKE 1/2 TABLET BY MOUTH EVERY MORNING AND 1/2 TABLET EACH EVENING Patient taking differently: Take 20 mg by mouth daily. 02/11/21  Yes Thurnell Lose, MD  propranolol (INDERAL) 60 MG tablet Take 120 mg by mouth 2 (two) times daily.   Yes [provider]  Psyllium Husk 100 % POWD Take 1 packet by mouth daily as needed (For diarrhea).   Yes [provider]  QUEtiapine (SEROQUEL) 25 MG tablet Take 1 tablet (25 mg total) by mouth at bedtime.  02/11/21  Yes Thurnell Lose, MD  Skin Protectants, Misc. (MINERIN CREME) CREA Apply 1 application topically as needed (dry skin). 02/11/21  Yes Thurnell Lose, MD  zinc oxide 20 % ointment Apply 1 Application topically daily.   Yes [provider]  HYDROcodone-acetaminophen (NORCO/VICODIN) 5-325 MG tablet Take 1 tablet by mouth every 4 (four) hours as needed for moderate pain or severe pain. Patient not taking: Reported on 03/27/2022 03/15/22   Charlott Rakes, PA-C  Nutritional Supplements (ENSURE ORIGINAL) LIQD Take 237 mLs by mouth 3 (three) times daily. Patient not taking: Reported on 03/27/2022 02/11/21   Thurnell Lose, MD      Allergies    Ampicillin, Cefaclor, Codeine, Duloxetine, Fexofenadine, Ibuprofen, Loratadine, and Nsaids    Review of Systems   Review of Systems  Unable to perform ROS: Dementia    Physical Exam Updated Vital Signs BP (!) 86/51 (BP Location: Left Arm)   Pulse 119   Temp (!) 97.4 F (36.3 C) (Axillary)   Resp 18   Wt 54.3 kg   SpO2 98%   BMI 21.21 kg/m  Physical Exam Vitals and nursing note reviewed.  Constitutional:      General: She is in acute distress.     Appearance: Normal appearance. She is well-developed. She is ill-appearing. She is not diaphoretic.     Comments: Patient is very pale and ill-appearing, somewhat lethargic but responds to verbal stimuli  HENT:     Head: Normocephalic and atraumatic.  Eyes:     General:        Right eye: No discharge.        Left eye: No discharge.     Pupils: Pupils are equal, round, and reactive to light.  Cardiovascular:     Rate and Rhythm: Regular rhythm. Tachycardia present.     Pulses: Normal pulses.     Heart sounds: Normal heart sounds.  Pulmonary:     Effort: Pulmonary effort is normal. No respiratory distress.     Breath sounds: Normal breath sounds. No wheezing or rales.     Comments: Respirations equal and unlabored, patient able to speak in full sentences, lungs clear to  auscultation bilaterally sats in the upper 80s on room air, placed on 2 L nasal cannula with improvement Abdominal:     General: Bowel sounds are normal. There is no distension.     Palpations: Abdomen is soft. There is no mass.     Tenderness: There is no abdominal tenderness. There is no guarding.     Comments: Abdomen soft, nondistended, nontender to palpation in all quadrants without guarding or peritoneal signs  Genitourinary:    Comments: Large amount of dark red blood present in brief.  Blood was cleared away and there was no immediate continued active bleeding, on rectal exam there is additional dark red blood  without stool present.  No evidence of vaginal bleeding Musculoskeletal:        General: No deformity.     Cervical back: Neck supple.  Skin:    General: Skin is dry.     Capillary Refill: Capillary refill takes less than 2 seconds.     Coloration: Skin is pale.  Neurological:     Mental Status: She is oriented to person, place, and time.     Coordination: Coordination normal.     Comments: Awakens to verbal stimuli, oriented to self Moving all extremities independently  Psychiatric:        Mood and Affect: Mood normal.        Behavior: Behavior normal.     ED Results / Procedures / Treatments   Labs (all labs ordered are listed, but only abnormal results are displayed) Labs Reviewed  MRSA NEXT GEN BY PCR, NASAL - Abnormal; Notable for the following components:      Result Value   MRSA by PCR Next Gen DETECTED (*)    All other components within normal limits  COMPREHENSIVE METABOLIC PANEL - Abnormal; Notable for the following components:   Glucose, Bld 145 (*)    BUN 36 (*)    Creatinine, Ser 1.28 (*)    Calcium 8.2 (*)    Total Protein 4.6 (*)    Albumin 2.3 (*)    GFR, Estimated 41 (*)    All other components within normal limits  CBC WITH DIFFERENTIAL/PLATELET - Abnormal; Notable for the following components:   WBC 13.3 (*)    RBC 2.69 (*)    Hemoglobin  8.7 (*)    HCT 27.0 (*)    MCV 100.4 (*)    Platelets 483 (*)    Neutro Abs 9.8 (*)    Abs Immature Granulocytes 0.09 (*)    All other components within normal limits  PROTIME-INR - Abnormal; Notable for the following components:   Prothrombin Time 18.0 (*)    INR 1.5 (*)    All other components within normal limits  VITAMIN B12 - Abnormal; Notable for the following components:   Vitamin B-12 1,089 (*)    All other components within normal limits  IRON AND TIBC - Abnormal; Notable for the following components:   TIBC 228 (*)    All other components within normal limits  RETICULOCYTES - Abnormal; Notable for the following components:   Retic Ct Pct 3.7 (*)    RBC. 2.60 (*)    Immature Retic Fract 16.2 (*)    All other components within normal limits  CBC - Abnormal; Notable for the following components:   WBC 10.8 (*)    RBC 3.56 (*)    Hemoglobin 11.0 (*)    HCT 33.3 (*)    RDW 15.8 (*)    All other components within normal limits  CBC - Abnormal; Notable for the following components:   WBC 12.9 (*)    RBC 3.07 (*)    Hemoglobin 9.3 (*)    HCT 28.9 (*)    RDW 16.0 (*)    All other components within normal limits  BASIC METABOLIC PANEL - Abnormal; Notable for the following components:   CO2 16 (*)    Glucose, Bld 136 (*)    BUN 44 (*)    Calcium 7.9 (*)    GFR, Estimated 59 (*)    All other components within normal limits  CBC - Abnormal; Notable for the following components:   WBC 12.5 (*)  RBC 2.69 (*)    Hemoglobin 8.2 (*)    HCT 25.1 (*)    RDW 16.1 (*)    All other components within normal limits  BASIC METABOLIC PANEL - Abnormal; Notable for the following components:   Chloride 113 (*)    Glucose, Bld 124 (*)    BUN 41 (*)    Creatinine, Ser 1.11 (*)    Calcium 8.2 (*)    GFR, Estimated 48 (*)    All other components within normal limits  PROTIME-INR - Abnormal; Notable for the following components:   Prothrombin Time 15.9 (*)    INR 1.3 (*)    All  other components within normal limits  GLUCOSE, CAPILLARY - Abnormal; Notable for the following components:   Glucose-Capillary 128 (*)    All other components within normal limits  GLUCOSE, CAPILLARY - Abnormal; Notable for the following components:   Glucose-Capillary 115 (*)    All other components within normal limits  I-STAT CHEM 8, ED - Abnormal; Notable for the following components:   Potassium 5.9 (*)    BUN 50 (*)    Creatinine, Ser 1.30 (*)    Glucose, Bld 139 (*)    Calcium, Ion 1.04 (*)    Hemoglobin 8.2 (*)    HCT 24.0 (*)    All other components within normal limits  APTT  FOLATE  FERRITIN  MAGNESIUM  PHOSPHORUS  MAGNESIUM  PHOSPHORUS  TYPE AND SCREEN  PREPARE RBC (CROSSMATCH)  BLOOD PRODUCT ORDER (VERBAL) VERIFICATION    EKG EKG Interpretation  Date/Time:  Saturday March 27 2022 15:07:34 EDT Ventricular Rate:  55 PR Interval:  191 QRS Duration: 99 QT Interval:  442 QTC Calculation: 423 R Axis:   -51 Text Interpretation: Sinus rhythm Left anterior fascicular block Low voltage, precordial leads Since last tracing artifact resolved Confirmed by Daleen Bo [phone]) on 03/27/2022 3:14:37 PM  Radiology  DG Chest Port 1 View  Result Date: 03/27/2022 CLINICAL DATA:  hypoxia EXAM: PORTABLE CHEST 1 VIEW COMPARISON:  March 11, 2022 FINDINGS: The cardiomediastinal silhouette is unchanged in contour.Atherosclerotic calcifications of the aorta. No pleural effusion. No pneumothorax. No acute pleuroparenchymal abnormality. Visualized abdomen is unremarkable. Status post LEFT shoulder arthroplasty. IMPRESSION: No acute cardiopulmonary abnormality. Electronically Signed   By: Valentino Saxon M.D.   On: 03/27/2022 14:36    Procedures .Critical Care  Performed by: Jacqlyn Larsen, PA-C Authorized by: Jacqlyn Larsen, PA-C   Critical care provider statement:    Critical care time (minutes):  60   Critical care time was exclusive of:  Separately billable procedures and  treating other patients and teaching time   Critical care was necessary to treat or prevent imminent or life-threatening deterioration of the following conditions:  Circulatory failure (GI Bleed)   Critical care was time spent personally by me on the following activities:  Development of treatment plan with patient or surrogate, discussions with consultants, evaluation of patient's response to treatment, examination of patient, ordering and review of laboratory studies, ordering and review of radiographic studies, ordering and performing treatments and interventions, pulse oximetry, re-evaluation of patient's condition and review of old charts   Care discussed with: admitting provider       Medications Ordered in ED Medications  pantoprozole (PROTONIX) 80 mg /NS 100 mL infusion (8 mg/hr Intravenous Infusion Verify 03/28/22 2100)  pantoprazole (PROTONIX) injection 40 mg (has no administration in time range)  docusate sodium (COLACE) capsule 100 mg (has no administration in time range)  polyethylene glycol (MIRALAX / GLYCOLAX) packet 17 g (has no administration in time range)  0.9 %  sodium chloride infusion (0 mLs Intravenous Stopped 03/27/22 2301)  donepezil (ARICEPT) tablet 5 mg (5 mg Oral Not Given 03/28/22 2217)  QUEtiapine (SEROQUEL) tablet 25 mg (25 mg Oral Not Given 03/28/22 2217)  divalproex (DEPAKOTE SPRINKLE) capsule 125 mg (125 mg Oral Not Given 03/28/22 2217)  Gerhardt's butt cream ( Topical Given 03/28/22 2218)  Oral care mouth rinse (has no administration in time range)  mupirocin ointment (BACTROBAN) 2 % 1 Application (1 Application Nasal Given 03/28/22 2217)  Chlorhexidine Gluconate Cloth 2 % PADS 6 each (0 each Topical Duplicate 10/27/32 2876)  white petrolatum (VASELINE) gel ( Topical Given 03/28/22 0620)  dextrose 10 % infusion ( Intravenous Infusion Verify 03/28/22 2100)  haloperidol lactate (HALDOL) injection 1 mg (has no administration in time range)  prothrombin complex conc human (KCENTRA)  IVPB 2,835 Units (0 Units Intravenous Stopped 03/27/22 1513)  0.9 %  sodium chloride infusion (0 mL/hr Intravenous Stopped 03/27/22 1655)  pantoprazole (PROTONIX) 80 mg /NS 100 mL IVPB (0 mg Intravenous Stopped 03/27/22 1635)  midazolam (VERSED) injection 0.5-1 mg (0.5 mg Intravenous Given 03/28/22 1015)  iohexol (OMNIPAQUE) 350 MG/ML injection 75 mL (75 mLs Intravenous Contrast Given 03/28/22 1106)    ED Course/ Medical Decision Making/ A&P                           Medical Decision Making Amount and/or Complexity of Data Reviewed Labs: ordered. Radiology: ordered.  Risk Prescription drug management. Decision regarding hospitalization.   88 year old female arrives via EMS noted to be hypotensive, hypoxic and altered.  Pale with blood noted in diaper at facility.  Initially thought to be vaginal bleeding at facility, but on exam here in the emergency department patient with dark red rectal bleeding.  Patient cleaned up with no immediate continued active bleeding.  Patient is on Eliquis and took first dose today.  Patient is hypotensive and tachycardic, unstable in setting of acute bleeding.  I called and spoke with patient's daughter, Faye Ramsay, regarding goals of care.  Patient's daughter reports that she would not want CPR or intubation and likely would not want surgery or other invasive procedures but is okay with medications and blood transfusions if needed.  She would like to talk to other family members before making further decisions and she is on her way to the hospital now but is about an hour away.  IV fluids ordered as well as 1 unit blood transfusion.  Kcentra ordered to reverse anticoagulation.  Pharmacy at bedside with [facility].  I have personally ordered and interpreted lab work.  I-STAT Chem-8 with hemoglobin of 8.2, creatinine of 1.3 with elevated BUN of 50 raising concern for potential upper GI bleed.  Patient also started on IV Protonix bolus and infusion.  I have viewed and  interpreted chest x-ray with no active cardiopulmonary disease.  I requested consultation with GI, case discussed with PA Carl Best, agrees with current management with blood transfusion and IV Protonix.  On reevaluation patient has filled up another brief with dark red blood, given ongoing active bleeding recommends ICU admission.  Could consider CTA GI bleed protocol and potential IR intervention, would not recommend EGD or colonoscopy at this time.  With interventions patient currently maintaining systolic blood pressure in the 100s.  I requested consultation with critical care for admission to the ICU.  Dr. Elsworth Soho at  bedside evaluating patient, will admit to ICU.  Will discuss further goals of care with daughter prior to proceeding with CTA or other invasive procedures.        Final Clinical Impression(s) / ED Diagnoses Final diagnoses:  Rectal bleeding  Chronic anticoagulation    Rx / DC Orders ED Discharge Orders     None         Janet Berlin 03/28/22 2345    Daleen Bo, MD 03/31/22 1535

## 2022-03-27 NOTE — ED Triage Notes (Signed)
Pt bib [facility] from [facility] after staff noticed "vaginal bleeding" this morning and increased altered mental status. Pt has dementia at baseline. Pt arrives pale, cyanotic, and lethargic. Pt is alert to self. Pt has dark stools on arrival and is hypotensive. Pt takes elliquis and last took it this morning. 89%RA  80/56 en route

## 2022-03-27 NOTE — Consult Note (Addendum)
Referring Provider: Dr. Vira Agar Wentz/ Benedetto Goad PA-C Primary Care Physician:  Patient, No Pcp Per Primary Gastroenterologist:  Dr. Carlean Purl 2005  Reason for Consultation: Hematochezia  HPI: CYDNE GRAHN is a 88 y.o. female with a past medical history of anxiety, depression, dementia, hypertension, migraine headache, osteoarthritis, right hip fracture s/p right hip hemiarthroplasty 03/12/2022 discharged to [facility] 03/18/2022. On Eliquis postoperatively for DVT prophylaxis, last dose taken today at 8:15 am.  She was assessed to have blood in her diaper with altered mental status with a syncopal episode as reported by the medical staff at [place] [facility].  She was transported to [facility] ED for further evaluation.  BP reported per EMS 80/60. She arrived to the ED with a diaper saturated with dark red blood. Labs in the ED showed WBC count of 13.3.  Hemoglobin 8.7 (Hg 10.4 on 03/15/2022, prior baseline Hg 12.6 - 13.9).  Hematocrit 27.  MCV 100.4.  Platelet 43.  Sodium 138.  Potassium 5.9.  BUN 50 (BUN 18 on 03/15/2022)  Creatinine 1.30 (Cr 0.6 on 7/24).  LFTs pending.  INR 1.5.  Iron, TIBC, B12 and folate levels pending.  Hypotensive with BP 86/51, repeat BP 100/51.  Bradycardic with heart rate in the 50s.  Afebrile. One unit PRBCs ordered, currently transfusing. A GI consult was requested for further evaluation for rectal bleeding and anemia.  She has dementia, no family at the bedside at this time.  Her daughter [name] on way to the hospital at this time.  She denies having any chest pain or shortness of breath.  No abdominal pain.  She has dementia therefore obtaining history is limited.  She is wearing a diaper, currently has a moderate amount of dark red blood.  Rectal exam done by the ED physician assistant confirmed blood was rectal and not vaginal.  Patient to be admitted to the ICU.   PASTS GI PROCEDURES: Colonoscopy 02/20/2004 by Dr. Carlean Purl:  Unspecified colitis, path report showed  benign colonic mucosa  Past Medical History:  Diagnosis Date   Anxiety    Arthritis    OA   Cerumen impaction    recurrent Lt   Depression    Dry skin    severe dry skin/ichthyosis( Uses tanner understands risk)   Hypertension    Macular degeneration    Migraine    Hx of   Peripheral neuropathy     Past Surgical History:  Procedure Laterality Date   APPENDECTOMY     CHOLECYSTECTOMY     RHINOPLASTY     SHOULDER SURGERY     left shoulder replacement   TOTAL HIP ARTHROPLASTY Right 03/12/2022   Procedure: TOTAL HIP ARTHROPLASTY ANTERIOR APPROACH;  Surgeon: Rod Can, MD;  Location: [facility]S;  Service: Orthopedics;  Laterality: Right;    Prior to Admission medications   Medication Sig Start Date End Date Taking? Authorizing Provider  acetaminophen (TYLENOL) 500 MG tablet Take 1 tablet (500 mg total) by mouth every 6 (six) hours as needed for moderate pain. 02/11/21   Thurnell Lose, MD  amLODipine (NORVASC) 10 MG tablet Take 1 tablet (10 mg total) by mouth daily. 02/11/21   Thurnell Lose, MD  apixaban (ELIQUIS) 2.5 MG TABS tablet Take 1 tablet (2.5 mg total) by mouth 2 (two) times daily. 03/15/22   Charlott Rakes, PA-C  Calcium Carbonate-Vitamin D 600-400 MG-UNIT tablet Take 1 tablet by mouth daily. 02/11/21   Thurnell Lose, MD  divalproex (DEPAKOTE SPRINKLE) 125 MG capsule  Take 125 mg by mouth 3 (three) times daily.    [provider]  donepezil (ARICEPT) 5 MG tablet Take 1 tablet (5 mg total) by mouth at bedtime. 02/11/21   Thurnell Lose, MD  Emollient (AQUAPHOR ADV PROTECT HEALING) 41 % OINT Apply 1 Application topically daily.    [provider]  furosemide (LASIX) 20 MG tablet Take 1 tablet (20 mg total) by mouth every other day. 02/11/21   Thurnell Lose, MD  guaifenesin (ROBITUSSIN) 100 MG/5ML syrup Take 10 mLs (200 mg total) by mouth every 6 (six) hours as needed for cough. 02/11/21   Thurnell Lose, MD  HYDROcodone-acetaminophen  (NORCO/VICODIN) 5-325 MG tablet Take 1 tablet by mouth every 4 (four) hours as needed for moderate pain or severe pain. 03/15/22   Hill, Marciano Sequin, PA-C  lisinopril (ZESTRIL) 40 MG tablet Take 40 mg by mouth daily.    [provider]  magnesium hydroxide (MILK OF MAGNESIA) 400 MG/5ML suspension Take 30 mLs by mouth at bedtime as needed for mild constipation. 02/11/21   Thurnell Lose, MD  melatonin 3 MG TABS tablet Take 1 tablet (3 mg total) by mouth at bedtime. 02/11/21   Thurnell Lose, MD  neomycin-bacitracin-polymyxin (NEOSPORIN) [id number] ointment Apply 1 application topically daily as needed (minor skin tears/ abrasions). 02/11/21   Thurnell Lose, MD  Nutritional Supplements (ENSURE ORIGINAL) LIQD Take 237 mLs by mouth 3 (three) times daily. 02/11/21   Thurnell Lose, MD  PARoxetine (PAXIL) 20 MG tablet TAKE 1/2 TABLET BY MOUTH EVERY MORNING AND 1/2 TABLET EACH EVENING Patient taking differently: Take 20 mg by mouth daily. 02/11/21   Thurnell Lose, MD  Psyllium Husk 100 % POWD Take 1 packet by mouth daily as needed (For diarrhea).    [provider]  QUEtiapine (SEROQUEL) 25 MG tablet Take 1 tablet (25 mg total) by mouth at bedtime. 02/11/21   Thurnell Lose, MD  Skin Protectants, Misc. (MINERIN CREME) CREA Apply 1 application topically as needed (dry skin). 02/11/21   Thurnell Lose, MD  zinc oxide 20 % ointment Apply 1 Application topically daily.    [provider]    Current Facility-Administered Medications  Medication Dose Route Frequency Provider Last Rate Last Admin   0.9 %  sodium chloride infusion  10 mL/hr Intravenous Once Ford, Kelsey N, PA-C       pantoprazole (PROTONIX) 80 mg /NS 100 mL IVPB  80 mg Intravenous Once Ford, Kelsey N, [place] ON 03/31/2022] pantoprazole (PROTONIX) injection 40 mg  40 mg Intravenous Q12H Ford, Kelsey N, PA-C       pantoprozole (PROTONIX) 80 mg /NS 100 mL infusion  8 mg/hr Intravenous Continuous Benedetto Goad N, PA-C       prothrombin complex conc human (KCENTRA) IVPB 2,835 Units  2,835 Units Intravenous STAT Daleen Bo, MD       Current Outpatient Medications  Medication Sig Dispense Refill   acetaminophen (TYLENOL) 500 MG tablet Take 1 tablet (500 mg total) by mouth every 6 (six) hours as needed for moderate pain. 30 tablet 0   amLODipine (NORVASC) 10 MG tablet Take 1 tablet (10 mg total) by mouth daily. 30 tablet 0   apixaban (ELIQUIS) 2.5 MG TABS tablet Take 1 tablet (2.5 mg total) by mouth 2 (two) times daily. 60 tablet 0   Calcium Carbonate-Vitamin D 600-400 MG-UNIT tablet Take 1 tablet by mouth daily. 30 tablet 0  divalproex (DEPAKOTE SPRINKLE) 125 MG capsule Take 125 mg by mouth 3 (three) times daily.     donepezil (ARICEPT) 5 MG tablet Take 1 tablet (5 mg total) by mouth at bedtime. 30 tablet 0   Emollient (AQUAPHOR ADV PROTECT HEALING) 41 % OINT Apply 1 Application topically daily.     furosemide (LASIX) 20 MG tablet Take 1 tablet (20 mg total) by mouth every other day. 30 tablet 0   guaifenesin (ROBITUSSIN) 100 MG/5ML syrup Take 10 mLs (200 mg total) by mouth every 6 (six) hours as needed for cough. 120 mL 0   HYDROcodone-acetaminophen (NORCO/VICODIN) 5-325 MG tablet Take 1 tablet by mouth every 4 (four) hours as needed for moderate pain or severe pain. 30 tablet 0   lisinopril (ZESTRIL) 40 MG tablet Take 40 mg by mouth daily.     magnesium hydroxide (MILK OF MAGNESIA) 400 MG/5ML suspension Take 30 mLs by mouth at bedtime as needed for mild constipation. 355 mL 0   melatonin 3 MG TABS tablet Take 1 tablet (3 mg total) by mouth at bedtime. 30 tablet 0   neomycin-bacitracin-polymyxin (NEOSPORIN) [phone] ointment Apply 1 application topically daily as needed (minor skin tears/ abrasions). 28.3 g 0   Nutritional Supplements (ENSURE ORIGINAL) LIQD Take 237 mLs by mouth 3 (three) times daily. 237 mL 10   PARoxetine (PAXIL) 20 MG tablet TAKE 1/2 TABLET BY MOUTH EVERY MORNING AND 1/2  TABLET EACH EVENING (Patient taking differently: Take 20 mg by mouth daily.) 30 tablet 0   Psyllium Husk 100 % POWD Take 1 packet by mouth daily as needed (For diarrhea).     QUEtiapine (SEROQUEL) 25 MG tablet Take 1 tablet (25 mg total) by mouth at bedtime. 30 tablet 0   Skin Protectants, Misc. (MINERIN CREME) CREA Apply 1 application topically as needed (dry skin). 113 g G   zinc oxide 20 % ointment Apply 1 Application topically daily.      Allergies as of 03/27/2022 - Review Complete 03/12/2022  Allergen Reaction Noted   Ampicillin  03/29/2007   Cefaclor  03/29/2007   Codeine  03/29/2007   Duloxetine  03/29/2007   Fexofenadine  03/29/2007   Ibuprofen  03/29/2007   Loratadine  03/29/2007   Nsaids  10/30/2007    Family History  Problem Relation Age of Onset   Stroke Mother    Hypertension Mother    Alcohol abuse Father    Heart disease Father    Kidney disease Father        dialysis   Hypertension Brother    Hypertension Daughter    Cancer Maternal Aunt        colon CA   Cancer Maternal Uncle        liver CA    Social History   Socioeconomic History   Marital status: Widowed    Spouse name: Not on file   Number of children: Not on file   Years of education: Not on file   Highest education level: Not on file  Occupational History   Not on file  Tobacco Use   Smoking status: Former    Types: Cigarettes    Quit date: 08/24/1983    Years since quitting: 38.6   Smokeless tobacco: Never  Substance and Sexual Activity   Alcohol use: No   Drug use: No   Sexual activity: Not on file  Other Topics Concern   Not on file  Social History Narrative   Not on file   Social Determinants of  Health   Financial Resource Strain: Not on file  Food Insecurity: Not on file  Transportation Needs: Not on file  Physical Activity: Not on file  Stress: Not on file  Social Connections: Not on file  Intimate Partner Violence: Not on file    Review of Systems: Limited review of  systems as patient has dementia but she denies having any chest pain, shortness of breath or abdominal pain at this time.  Physical Exam: Vital signs in last 24 hours: Temp:  [99.8 F (37.7 C)] 99.8 F (37.7 C) (08/05 1409) Pulse Rate:  [51] 51 (08/05 1400) Resp:  [16-17] 17 (08/05 1410) BP: (86-90)/(51-72) 90/72 (08/05 1410) SpO2:  [100 %] 100 % (08/05 1400) Weight:  [55 kg] 55 kg (08/05 1400)   General: Ill-appearing 88 year old female, conversant. Head:  Normocephalic and atraumatic. Eyes:  No scleral icterus. Conjunctiva pink. Ears:  Normal auditory acuity. Nose:  No deformity, discharge or lesions. Mouth:  Dentition intact. No ulcers or lesions.  Neck:  Supple. No lymphadenopathy or thyromegaly.  Lungs: Breath sounds clear, decreased bases Heart: Distant S1, S2. Regular rhythm.  No murmur. Abdomen: Somewhat distended with anasarca, nontender.  Hypoactive bowel sounds to all 4 quadrants.  No palpable mass. Rectal: Deferred, rectal exam completed by the ED PA Musculoskeletal:  Symmetrical without gross deformities.  Pulses:  Normal pulses noted. Extremities: Bilateral lower extremity edema. Neurologic:  Alert to name.  Speech is clear, softly spoken.  Moves all extremities. Skin: Significant candidiasis/intertrigo under left breast. Psych:  Alert and cooperative. Normal mood and affect.  Intake/Output from previous day: No intake/output data recorded. Intake/Output this shift: No intake/output data recorded.  Lab Results: Recent Labs    03/27/22 1427  HGB 8.2*  HCT 24.0*   BMET Recent Labs    03/27/22 1427  NA 138  K 5.9*  CL 109  GLUCOSE 139*  BUN 50*  CREATININE 1.30*   LFT No results for input(s): "PROT", "ALBUMIN", "AST", "ALT", "ALKPHOS", "BILITOT", "BILIDIR", "IBILI" in the last 72 hours. PT/INR No results for input(s): "LABPROT", "INR" in the last 72 hours. Hepatitis Panel No results for input(s): "HEPBSAG", "HCVAB", "HEPAIGM", "HEPBIGM" in the  last 72 hours.    Studies/Results: DG Chest Port 1 View  Result Date: 03/27/2022 CLINICAL DATA:  hypoxia EXAM: PORTABLE CHEST 1 VIEW COMPARISON:  March 11, 2022 FINDINGS: The cardiomediastinal silhouette is unchanged in contour.Atherosclerotic calcifications of the aorta. No pleural effusion. No pneumothorax. No acute pleuroparenchymal abnormality. Visualized abdomen is unremarkable. Status post LEFT shoulder arthroplasty. IMPRESSION: No acute cardiopulmonary abnormality. Electronically Signed   By: Valentino Saxon M.D.   On: 03/27/2022 14:36    IMPRESSION/PLAN:  88 year old female with hematochezia and symptomatic anemia.  One unit of PRBCs transfusing.  She arrived with diaper filled with dark red blood, -NPO for now -IV fluids per the hospitalist -Check H&H posttransfusion and every 6hr x 24 hrs -Transfuse PRBCs for Hg < 8 as as needed for symptomatic active GI bleeding -Recommend CTA if family agreeable, IR consult with coil embolization if CTA positive for active bleeding -Due to age and multiple comorbidities, endoscopic evaluation deferred -PPI IV daily -Further recommendations per Dr. Lyndel Safe  S/P right hip hemiarthroplasty 03/12/2022, on Eliquis postoperatively (last dose received at 8:15 AM today), -Hold Eliquis  AKI -IV fluids per the medical service  Mild leukocytosis Urinalysis, urine culture recommended  Intertrigo/candidiasis under left breast -Management/treatment per the hospitalist     Noralyn Pick  03/27/2022, 2:47 PM   Attending  physician's note   I have taken reviewed the chart and examined the patient. I performed a substantive portion of this encounter, including complete performance of at least one of the key components, in conjunction with the APP. I agree with the Advanced Practitioner's note, impression and recommendations.   GI bleed - Upper vs lower. Could by upper given increased BUN Recent R hip hemiarthroplasty 03/12/2022 on Eliquis  (last dose 8/5)  Plan: -IV protonix drip -Trend CBC -EGD in AM after Eliquis washout.  -If active bleed, CTA tonight. -D/W Dr Elsworth Soho   Carmell Austria, MD [facility] [phone]

## 2022-03-27 NOTE — H&P (Signed)
NAME:  Suzanne Monroe, MRN:  [id number], DOB:  August 03, 1933, LOS: 0 ADMISSION DATE:  03/27/2022, CONSULTATION DATE:  03/27/2022  REFERRING MD:  ED, Eulis Foster, CHIEF COMPLAINT: Bleeding  History of Present Illness:  88 year old woman with dementia for 10 years who had an unwitnessed fall on 7/20 and found to have right hip fracture, underwent right hip hemiarthroplasty was put on Eliquis For prophylaxis.  She was brought in today due to blood in her diaper, initially thought to be vaginal but then noted to be GI bleeding , dark red to black.  She was noted to be altered with a syncopal episode reported by medical staff. Initial blood pressure 80/60, hemoglobin was 8.7, last hemoglobin was 10.4 on 7/24, previous baseline 7.6 , labs significant for hyperkalemia 5.9 and BUN/creatinine of 50/1.3.  Blood transfusion was ordered and PCCM asked to admit  Pertinent  Medical History  Status post right hip hemiarthroplasty 03/12/2022 Dementia Hypertension  Significant Hospital Events: Including procedures, antibiotic start and stop dates in addition to other pertinent events     Interim History / Subjective:  Denies pain, dyspnea  Objective   Blood pressure (!) 97/48, pulse (!) 57, temperature 97.9 F (36.6 C), temperature source Oral, resp. rate 18, weight 55 kg, SpO2 100 %.        Intake/Output Summary (Last 24 hours) at 03/27/2022 1641 Last data filed at 03/27/2022 1635 Gross per 24 hour  Intake 399.4 ml  Output --  Net 399.4 ml   Filed Weights   03/27/22 1400  Weight: 55 kg    Examination: General: Elderly woman lying supine in bed, no distress HENT: Mild pallor, no icterus, no JVD, dry mucosa Lungs: Clear breath sounds bilateral, no accessory muscle use Cardiovascular: S1-S2 bradycardia, no murmur Abdomen: Soft, nontender, no guarding no hepatosplenomegaly Extremities: No edema, no deformity, right hip bandage Neuro: Alert, responds to simple questions and one-step commands, poor short-term  memory GU: PureWick , mixed dark red and black blood on diaper  Resolved Hospital Problem list     Assessment & Plan:   Hemorrhagic shock -improved with fluids and the transfusion Acute blood loss anemia GI bleeding -favor upper due to melena  -IV Protonix drip -Transfuse for Hb above 8 -GI is requesting CT angio first and if negative can plan for EGD tomorrow -We will repeat Pete MAP in 4 hours and if creatinine has improved after hydration and blood, can proceed with CT angiogram   AKI - hold lisinopril -IV fluids  Dementia -resume Aricept, valproate and Seroquel when able, currently on hold Use Haldol if needed   Best Practice (right click and "Reselect all SmartList Selections" daily)   Diet/type: NPO DVT prophylaxis: SCD GI prophylaxis: PPI Lines: N/A Foley:  N/A Code Status:  DNR Last date of multidisciplinary goals of care discussion [8/5 detailed discussion with daughter [name] who is POA.  She is conflicting about putting mom through more stuff, "she is suffering" -she would like to speak to more family members, probably not agreeable to angiogram but would be okay with EGD.  We decided on DNR/DNI and no pressors  Labs   CBC: Recent Labs  Lab 03/27/22 1421 03/27/22 1427  WBC 13.3*  --   NEUTROABS 9.8*  --   HGB 8.7* 8.2*  HCT 27.0* 24.0*  MCV 100.4*  --   PLT 483*  --     Basic Metabolic Panel: Recent Labs  Lab 03/27/22 1421 03/27/22 1427  NA 141 138  K 5.1 5.9*  CL 111 109  CO2 22  --   GLUCOSE 145* 139*  BUN 36* 50*  CREATININE 1.28* 1.30*  CALCIUM 8.2*  --    GFR: Estimated Creatinine Clearance: 25.2 mL/min (A) (by C-G formula based on SCr of 1.3 mg/dL (H)). Recent Labs  Lab 03/27/22 1421  WBC 13.3*    Liver Function Tests: Recent Labs  Lab 03/27/22 1421  AST 16  ALT 9  ALKPHOS 79  BILITOT 0.7  PROT 4.6*  ALBUMIN 2.3*   No results for input(s): "LIPASE", "AMYLASE" in the last 168 hours. No results for input(s): "AMMONIA" in  the last 168 hours.  ABG    Component Value Date/Time   TCO2 22 03/27/2022 1427     Coagulation Profile: Recent Labs  Lab 03/27/22 1421  INR 1.5*    Cardiac Enzymes: No results for input(s): "CKTOTAL", "CKMB", "CKMBINDEX", "TROPONINI" in the last 168 hours.  HbA1C: No results found for: "HGBA1C"  CBG: No results for input(s): "GLUCAP" in the last 168 hours.  Review of Systems:   Unable to obtain from patient.   Past Medical History:  She,  has a past medical history of Anxiety, Arthritis, Cerumen impaction, Depression, Dry skin, Hypertension, Macular degeneration, Migraine, and Peripheral neuropathy.   Surgical History:   Past Surgical History:  Procedure Laterality Date   APPENDECTOMY     CHOLECYSTECTOMY     RHINOPLASTY     SHOULDER SURGERY     left shoulder replacement   TOTAL HIP ARTHROPLASTY Right 03/12/2022   Procedure: TOTAL HIP ARTHROPLASTY ANTERIOR APPROACH;  Surgeon: Rod Can, MD;  Location: [facility];  Service: Orthopedics;  Laterality: Right;     Social History:   reports that she quit smoking about 38 years ago. She has never used smokeless tobacco. She reports that she does not drink alcohol and does not use drugs.   Family History:  Her family history includes Alcohol abuse in her father; Cancer in her maternal aunt and maternal uncle; Heart disease in her father; Hypertension in her brother, daughter, and mother; Kidney disease in her father; Stroke in her mother.   Allergies Allergies  Allergen Reactions   Ampicillin Other (See Comments)    unknown   Cefaclor Diarrhea   Codeine Diarrhea   Duloxetine Diarrhea   Fexofenadine Other (See Comments)    Dizziness, fuzzy   Ibuprofen Other (See Comments)    GI upset   Loratadine Other (See Comments)    blurred vision   Nsaids Other (See Comments)    fatigue     Home Medications  Prior to Admission medications   Medication Sig Start Date End Date Taking? Authorizing Provider   acetaminophen (TYLENOL) 500 MG tablet Take 1 tablet (500 mg total) by mouth every 6 (six) hours as needed for moderate pain. 02/11/21  Yes Thurnell Lose, MD  amLODipine (NORVASC) 10 MG tablet Take 1 tablet (10 mg total) by mouth daily. 02/11/21  Yes Thurnell Lose, MD  apixaban (ELIQUIS) 2.5 MG TABS tablet Take 1 tablet (2.5 mg total) by mouth 2 (two) times daily. 03/15/22  Yes Hill, Marciano Sequin, PA-C  Calcium Carbonate-Vitamin D 600-400 MG-UNIT tablet Take 1 tablet by mouth daily. 02/11/21  Yes Thurnell Lose, MD  divalproex (DEPAKOTE SPRINKLE) 125 MG capsule Take 125 mg by mouth 3 (three) times daily.   Yes [provider]  donepezil (ARICEPT) 5 MG tablet Take 1 tablet (5 mg total) by mouth at bedtime. 02/11/21  Yes Thurnell Lose, MD  Emollient (AQUAPHOR ADV PROTECT HEALING) 41 % OINT Apply 1 Application topically daily.   Yes [provider]  furosemide (LASIX) 20 MG tablet Take 1 tablet (20 mg total) by mouth every other day. 02/11/21  Yes Thurnell Lose, MD  guaifenesin (ROBITUSSIN) 100 MG/5ML syrup Take 10 mLs (200 mg total) by mouth every 6 (six) hours as needed for cough. 02/11/21  Yes Thurnell Lose, MD  haloperidol (HALDOL) 5 MG tablet Take 5 mg by mouth as needed for agitation.   Yes [provider]  lisinopril (ZESTRIL) 40 MG tablet Take 40 mg by mouth daily.   Yes [provider]  loperamide (IMODIUM) 2 MG capsule Take 2 mg by mouth as needed for diarrhea or loose stools.   Yes [provider]  magnesium hydroxide (MILK OF MAGNESIA) 400 MG/5ML suspension Take 30 mLs by mouth at bedtime as needed for mild constipation. 02/11/21  Yes Thurnell Lose, MD  melatonin 3 MG TABS tablet Take 1 tablet (3 mg total) by mouth at bedtime. 02/11/21  Yes Thurnell Lose, MD  neomycin-bacitracin-polymyxin (NEOSPORIN) [phone] ointment Apply 1 application topically daily as needed (minor skin tears/ abrasions). 02/11/21  Yes Thurnell Lose, MD   PARoxetine (PAXIL) 20 MG tablet TAKE 1/2 TABLET BY MOUTH EVERY MORNING AND 1/2 TABLET EACH EVENING Patient taking differently: Take 20 mg by mouth daily. 02/11/21  Yes Thurnell Lose, MD  propranolol (INDERAL) 60 MG tablet Take 120 mg by mouth 2 (two) times daily.   Yes [provider]  Psyllium Husk 100 % POWD Take 1 packet by mouth daily as needed (For diarrhea).   Yes [provider]  QUEtiapine (SEROQUEL) 25 MG tablet Take 1 tablet (25 mg total) by mouth at bedtime. 02/11/21  Yes Thurnell Lose, MD  Skin Protectants, Misc. (MINERIN CREME) CREA Apply 1 application topically as needed (dry skin). 02/11/21  Yes Thurnell Lose, MD  zinc oxide 20 % ointment Apply 1 Application topically daily.   Yes [provider]  HYDROcodone-acetaminophen (NORCO/VICODIN) 5-325 MG tablet Take 1 tablet by mouth every 4 (four) hours as needed for moderate pain or severe pain. Patient not taking: Reported on 03/27/2022 03/15/22   Charlott Rakes, PA-C  Nutritional Supplements (ENSURE ORIGINAL) LIQD Take 237 mLs by mouth 3 (three) times daily. Patient not taking: Reported on 03/27/2022 02/11/21   Thurnell Lose, MD     Critical care time: 42m      RKara MeadMD. FBedford [facility] Medical [facility] Pager : [phone]  If no response to pager , please call [phone] until 7 pm After 7:00 pm call Elink  [phone]  03/27/2022

## 2022-03-27 NOTE — ED Provider Notes (Signed)
  Face-to-face evaluation   History: Patient presents for bleeding, at the perineum, suspected to be vaginal by staff.  She does have a nursing care facility.  Physical exam: Alert elderly female.  She is conversant.  Blood pressure 7 systolic.  She is mentating well.  Her abdomen is soft and nontender to palpation.  Conjunctivae are not pale.  MDM: Evaluation for No chief complaint on file.    Patient presenting with signs and symptoms of gastrointestinal bleeding.  She takes Eliquis.  This will likely need to be reversed.  Will screen hemoglobin testing, give IV fluids in the meantime and continue to reassess closely.  We will contact patient's legal guardian/power of attorney and discuss options and CODE STATUS.  Medical screening examination/treatment/procedure(s) were conducted as a shared visit with non-physician practitioner(s) and myself.  I personally evaluated the patient during the encounter    Daleen Bo, MD 03/28/22 [phone]

## 2022-03-27 NOTE — ED Provider Notes (Signed)
Procedure note: Ultrasound Guided Peripheral IV Ultrasound guided peripheral 1.88 inch angiocath IV placement performed by me. Indications: Nursing unable to place IV. Details: The antecubital fossa and upper arm were evaluated with a multifrequency linear probe. Patent brachial veins were noted. 1 attempt was made to cannulate a vein under realtime US guidance with successful cannulation of the vein and catheter placement. There is return of non-pulsatile dark red blood. The patient tolerated the procedure well without complications. Images archived electronically.  CPT codes: [phone] and Roy, [place], DO 03/27/22 1433

## 2022-03-28 ENCOUNTER — Encounter (HOSPITAL_COMMUNITY): Payer: Self-pay | Admitting: Certified Registered"

## 2022-03-28 ENCOUNTER — Encounter (HOSPITAL_COMMUNITY)
Admission: EM | Disposition: A | Discharge status: Hospice | Payer: Self-pay | Source: Home / Self Care | Attending: Pulmonary Disease

## 2022-03-28 ENCOUNTER — Inpatient Hospital Stay (HOSPITAL_COMMUNITY): Payer: Medicare Other

## 2022-03-28 DIAGNOSIS — K625 Hemorrhage of anus and rectum: Secondary | ICD-10-CM | POA: Diagnosis not present

## 2022-03-28 DIAGNOSIS — K922 Gastrointestinal hemorrhage, unspecified: Secondary | ICD-10-CM | POA: Diagnosis not present

## 2022-03-28 DIAGNOSIS — Z7189 Other specified counseling: Secondary | ICD-10-CM

## 2022-03-28 DIAGNOSIS — L304 Erythema intertrigo: Secondary | ICD-10-CM

## 2022-03-28 DIAGNOSIS — Z515 Encounter for palliative care: Secondary | ICD-10-CM

## 2022-03-28 DIAGNOSIS — N179 Acute kidney failure, unspecified: Secondary | ICD-10-CM

## 2022-03-28 DIAGNOSIS — Z66 Do not resuscitate: Secondary | ICD-10-CM | POA: Diagnosis not present

## 2022-03-28 DIAGNOSIS — Z7901 Long term (current) use of anticoagulants: Secondary | ICD-10-CM | POA: Diagnosis not present

## 2022-03-28 DIAGNOSIS — R578 Other shock: Secondary | ICD-10-CM | POA: Diagnosis not present

## 2022-03-28 LAB — CBC
HCT: 28.9 % — ABNORMAL LOW (ref 36.0–46.0)
HCT: 25.1 % — ABNORMAL LOW (ref 36.0–46.0)
Hemoglobin: 9.3 g/dL — ABNORMAL LOW (ref 12.0–15.0)
Hemoglobin: 8.2 g/dL — ABNORMAL LOW (ref 12.0–15.0)
MCH: 30.3 pg (ref 26.0–34.0)
MCH: 30.5 pg (ref 26.0–34.0)
MCHC: 32.2 g/dL (ref 30.0–36.0)
MCHC: 32.7 g/dL (ref 30.0–36.0)
MCV: 94.1 fL (ref 80.0–100.0)
MCV: 93.3 fL (ref 80.0–100.0)
Platelets: 378 10*3/uL (ref 150–400)
Platelets: 357 10*3/uL (ref 150–400)
RBC: 3.07 MIL/uL — ABNORMAL LOW (ref 3.87–5.11)
RBC: 2.69 MIL/uL — ABNORMAL LOW (ref 3.87–5.11)
RDW: 16 % — ABNORMAL HIGH (ref 11.5–15.5)
RDW: 16.1 % — ABNORMAL HIGH (ref 11.5–15.5)
WBC: 12.9 10*3/uL — ABNORMAL HIGH (ref 4.0–10.5)
WBC: 12.5 10*3/uL — ABNORMAL HIGH (ref 4.0–10.5)
nRBC: 0 % (ref 0.0–0.2)
nRBC: 0 % (ref 0.0–0.2)

## 2022-03-28 LAB — BASIC METABOLIC PANEL
Anion gap: 12 (ref 5–15)
BUN: 44 mg/dL — ABNORMAL HIGH (ref 8–23)
CO2: 16 mmol/L — ABNORMAL LOW (ref 22–32)
Calcium: 7.9 mg/dL — ABNORMAL LOW (ref 8.9–10.3)
Chloride: 111 mmol/L (ref 98–111)
Creatinine, Ser: 0.94 mg/dL (ref 0.44–1.00)
GFR, Estimated: 59 mL/min — ABNORMAL LOW (ref >60)
Glucose, Bld: 136 mg/dL — ABNORMAL HIGH (ref 70–99)
Potassium: 4.3 mmol/L (ref 3.5–5.1)
Sodium: 139 mmol/L (ref 135–145)

## 2022-03-28 LAB — MAGNESIUM: Magnesium: 1.9 mg/dL (ref 1.7–2.4)

## 2022-03-28 LAB — PROTIME-INR
INR: 1.3 — ABNORMAL HIGH (ref 0.8–1.2)
Prothrombin Time: 15.9 seconds — ABNORMAL HIGH (ref 11.4–15.2)

## 2022-03-28 LAB — PHOSPHORUS: Phosphorus: 2.8 mg/dL (ref 2.5–4.6)

## 2022-03-28 LAB — GLUCOSE, CAPILLARY
Glucose-Capillary: 128 mg/dL — ABNORMAL HIGH (ref 70–99)
Glucose-Capillary: 115 mg/dL — ABNORMAL HIGH (ref 70–99)

## 2022-03-28 LAB — BLOOD PRODUCT ORDER (VERBAL) VERIFICATION

## 2022-03-28 SURGERY — CANCELLED PROCEDURE

## 2022-03-28 MED ORDER — WHITE PETROLATUM EX OINT
TOPICAL_OINTMENT | CUTANEOUS | Status: DC | PRN
  Filled 2022-03-28: qty 28.35

## 2022-03-28 MED ORDER — DEXTROSE 10 % IV SOLN: INTRAVENOUS | Status: DC

## 2022-03-28 MED ORDER — HALOPERIDOL LACTATE 5 MG/ML IJ SOLN: 1.0000 mg | Freq: Four times a day (QID) | INTRAMUSCULAR | Status: DC | PRN

## 2022-03-28 MED ORDER — IOHEXOL 350 MG/ML SOLN
75.0000 mL | Freq: Once | INTRAVENOUS | Status: AC | PRN
  Administered 2022-03-28: 75 mL via INTRAVENOUS

## 2022-03-28 MED ORDER — MIDAZOLAM HCL 2 MG/2ML IJ SOLN
0.5000 mg | Freq: Once | INTRAMUSCULAR | Status: AC
  Administered 2022-03-28: 0.5 mg via INTRAVENOUS
  Filled 2022-03-28: qty 2

## 2022-03-28 SURGICAL SUPPLY — 15 items

## 2022-03-28 NOTE — Progress Notes (Signed)
NAME:  Suzanne Monroe, MRN:  [id number], DOB:  [birth year], LOS: 1 ADMISSION DATE:  03/27/2022, CONSULTATION DATE:  03/28/2022  REFERRING MD:  ED, Eulis Foster, CHIEF COMPLAINT: Bleeding  History of Present Illness:  88 year old woman with dementia for 10 years who had an unwitnessed fall on 7/20 and found to have right hip fracture, underwent right hip hemiarthroplasty was put on Eliquis For prophylaxis.  She was brought in today due to blood in her diaper, initially thought to be vaginal but then noted to be GI bleeding , dark red to black.  She was noted to be altered with a syncopal episode reported by medical staff. Initial blood pressure 80/60, hemoglobin was 8.7, last hemoglobin was 10.4 on 7/24, previous baseline 7.6 , labs significant for hyperkalemia 5.9 and BUN/creatinine of 50/1.3.  Blood transfusion was ordered and PCCM asked to admit  Pertinent  Medical History  Status post right hip hemiarthroplasty 03/12/2022 Dementia Hypertension  Significant Hospital Events: Including procedures, antibiotic start and stop dates in addition to other pertinent events     Interim History / Subjective:   Maroon stool reported by RN Patient denies any complaints.   Objective   Blood pressure (!) 104/55, pulse 63, temperature 97.8 F (36.6 C), temperature source Axillary, resp. rate 16, weight 54.3 kg, SpO2 99 %.        Intake/Output Summary (Last 24 hours) at 03/28/2022 1018 Last data filed at 03/28/2022 0700 Gross per 24 hour  Intake 1692.49 ml  Output 200 ml  Net 1492.49 ml    Filed Weights   03/27/22 1400 03/27/22 1800 03/28/22 0214  Weight: 55 kg 55.8 kg 54.3 kg    Examination: General: Elderly woman lying supine in bed, no distress HENT: Mild pallor, no icterus, no JVD, dry mucosa Lungs: No accessory muscle use, clear breath sounds bilateral Cardiovascular: S1-S2 regular, no murmur Abdomen: Soft, nontender, no guarding no hepatosplenomegaly Extremities: No edema, no deformity, right  hip bandage Neuro: Alert, responds to simple questions and one-step commands, poor short-term memory GU: PureWick   Labs show normal lites creatinine , hemoglobin improved to 11 posttransfusion and has slowly trended down to 8.2 again  Resolved Hospital Problem list   Hemorrhagic shock  AKI   Assessment & Plan:    Acute blood loss anemia GI bleeding -upper versus lower, -Eliquis reversed by Kcentra -IV Protonix drip -Transfuse for Hb above 8 -Obtain CT angiogram now that creatinine is normalized , if no evidence of lower GI bleed then proceed with EGD   Dementia -resume Aricept, valproate and Seroquel when able, currently on hold Use Haldol if needed  End-of-life discussion 8/5 -Daughter is conflicted about subjecting mom to further procedures but was willing to go through EGD.  We will involve palliative care for family support, DNR issued.  Would be agreeable to hospice  Best Practice (right click and "Reselect all SmartList Selections" daily)   Diet/type: NPO DVT prophylaxis: SCD GI prophylaxis: PPI Lines: N/A Foley:  N/A Code Status:  DNR Last date of multidisciplinary goals of care discussion [8/5 detailed discussion with daughter [name] who is POA.  She is conflicting about putting mom through more stuff, "she is suffering" -she would like to speak to more family members, probably not agreeable to angiogram but would be okay with EGD.  We decided on DNR/DNI and no pressors  Labs   CBC: Recent Labs  Lab 03/27/22 1421 03/27/22 1427 03/27/22 2023 03/28/22 0100 03/28/22 0755  WBC 13.3*  --  10.8* 12.9*  12.5*  NEUTROABS 9.8*  --   --   --   --   HGB 8.7* 8.2* 11.0* 9.3* 8.2*  HCT 27.0* 24.0* 33.3* 28.9* 25.1*  MCV 100.4*  --  93.5 94.1 93.3  PLT 483*  --  343 378 357     Basic Metabolic Panel: Recent Labs  Lab 03/27/22 1421 03/27/22 1427 03/27/22 2023 03/28/22 0100  NA 141 138 141 139  K 5.1 5.9* 4.7 4.3  CL 111 109 113* 111  CO2 22  --  22 16*   GLUCOSE 145* 139* 124* 136*  BUN 36* 50* 41* 44*  CREATININE 1.28* 1.30* 1.11* 0.94  CALCIUM 8.2*  --  8.2* 7.9*  MG  --   --  2.1 1.9  PHOS  --   --  3.2 2.8    GFR: Estimated Creatinine Clearance: 34.9 mL/min (by C-G formula based on SCr of 0.94 mg/dL). Recent Labs  Lab 03/27/22 1421 03/27/22 2023 03/28/22 0100 03/28/22 0755  WBC 13.3* 10.8* 12.9* 12.5*     Liver Function Tests: Recent Labs  Lab 03/27/22 1421  AST 16  ALT 9  ALKPHOS 79  BILITOT 0.7  PROT 4.6*  ALBUMIN 2.3*    No results for input(s): "LIPASE", "AMYLASE" in the last 168 hours. No results for input(s): "AMMONIA" in the last 168 hours.  ABG    Component Value Date/Time   TCO2 22 03/27/2022 1427     Coagulation Profile: Recent Labs  Lab 03/27/22 1421 03/28/22 0755  INR 1.5* 1.3*     Cardiac Enzymes: No results for input(s): "CKTOTAL", "CKMB", "CKMBINDEX", "TROPONINI" in the last 168 hours.  HbA1C: No results found for: "HGBA1C"  CBG: Recent Labs  Lab 03/28/22 [place] MD. Shade Flood. [facility] Pager : [phone]  If no response to pager , please call [phone] until 7 pm After 7:00 pm call Elink  [phone]   03/28/2022

## 2022-03-28 NOTE — Progress Notes (Signed)
eLink Physician-Brief Progress Note Patient Name: Suzanne Monroe DOB: 03-Jul-1933 MRN: [id number]   Date of Service  03/28/2022  HPI/Events of Note  Hemoglobin 9.3 gm / dl.  eICU Interventions  Will continue to monitor closely and trend H & H for now.        Kerry Kass Sherley Mckenney 03/28/2022, 1:37 AM

## 2022-03-28 NOTE — Progress Notes (Signed)
   GI brief progress note:  -CTA negative -Have discussed with family.  They would like to hold off on EGD or any other intervention   We will sign off for now.   Carmell Austria, MD [facility] [phone]

## 2022-03-28 NOTE — Anesthesia Preprocedure Evaluation (Deleted)
Anesthesia Evaluation    Reviewed: Allergy & Precautions, Patient's Chart, lab work & pertinent test results  Airway        Dental   Pulmonary former smoker,           Cardiovascular hypertension,      Neuro/Psych  Headaches, PSYCHIATRIC DISORDERS Anxiety Depression Dementia  Neuromuscular disease    GI/Hepatic Neg liver ROS, PUD,   Endo/Other  negative endocrine ROS  Renal/GU Renal disease (AKI)     Musculoskeletal  (+) Arthritis , Fibromyalgia -  Abdominal   Peds  Hematology  (+) Blood dyscrasia, anemia ,   Anesthesia Other Findings   Reproductive/Obstetrics                             Anesthesia Physical Anesthesia Plan  ASA: 3  Anesthesia Plan: MAC   Post-op Pain Management: Minimal or no pain anticipated   Induction: Intravenous  PONV Risk Score and Plan: 2 and TIVA and Treatment may vary due to age or medical condition  Airway Management Planned: Natural Airway and Nasal Cannula  Additional Equipment:   Intra-op Plan:   Post-operative Plan:   Informed Consent:   Plan Discussed with:   Anesthesia Plan Comments:         Anesthesia Quick Evaluation

## 2022-03-28 NOTE — Consult Note (Signed)
Consultation Note Date: 03/28/2022   Patient Name: Suzanne Monroe  DOB: Jan 03, 1933  MRN: [id number]  Age / Sex: 88 y.o., female  PCP: Patient, No Pcp Per Referring Physician: Rigoberto Noel, MD  Reason for Consultation: Establishing goals of care  HPI/Patient Profile: 88 y.o. female  with past medical history of dementia (10 years), HTN, and right hip hemiarthroplasty (postop Eliquis) admitted on 03/27/2022 from SNF [facility] with GI bleed.   GI was consulted.  Patient is being treated with IV Protonix gtt.  CT angiogram showed no lower GI bleed.  GI and family discussed EGD.  Family does not wish to proceed with EGD.  Palliative medicine team was consulted to assist with goals of care.  Clinical Assessment and Goals of Care: I have reviewed medical records including EPIC notes, labs and imaging, assessed the patient and then met first with patient's daughter Suzanne Monroe and patient's brother at bedside.  I returned a few hour later met with patient's daughter Suzanne Monroe and other daughter Suzanne Monroe at bedside to discuss diagnosis prognosis, [place], EOL wishes, disposition and options.  I introduced Palliative Medicine as specialized medical care for people living with serious illness. It focuses on providing relief from the symptoms and stress of a serious illness. The goal is to improve quality of life for both the patient and the family.  As far as functional and nutritional status PTA Suzanne Monroe endorses patient was eating "like a bird".  Suzanne Monroe says patient was sleeping 90% of the time.  During wakefulness, patient would either repeat what her name was or become combative, swot, or hit at those to attempted to provide care.  We discussed patient's current illness and what it means in the larger context of patient's on-going co-morbidities.  Natural disease trajectory and expectations at EOL for patients with dementia were  discussed. I attempted to elicit values and goals of care important to the patient.  Suzanne Monroe shares her mother has not had any quality of life for quite some time.  Suzanne Monroe states if her mother could see herself in the state that she is and then she would not want to live like this.  The difference between aggressive medical intervention and comfort care was considered in light of the patient's goals of care.  Suzanne Monroe and Suzanne Monroe are in agreement to focus on keeping the patient comfortable.  They both would like to avoid any future lab draws, blood products, or other procedures/interventions not focused solely on comfort.  Plan is to continue Protonix and liberate patient's diet to a regular diet.  Confirmed patient's CODE STATUS is to remain DNR/DNI.  Hospice services and philosophy discussed in detail.  Family would like patient to be evaluated for inpatient hospice placement.  TOC and attending made aware.  Advance directives, concepts specific to code status, artificial feeding and hydration, and rehospitalization were considered and discussed.  Suzanne Monroe and Suzanne Monroe are in agreement patient would not want to return to the hospital but be kept comfortable in place.  They agree she would never  want to receive antibiotics, IV fluids, or have a feeding tube placed.  Family has experience with other family members who were taken back and forth to the hospital at end of life and do not want their mother transferred back and forth. Therefore, MOST form was completed to clarify these wishes should patient return to SNF/not be eligible for hospice inpatient unit.  Questions and concerns were addressed. The family was encouraged to call with questions or concerns.   Primary Decision Maker HCPOA  Code Status/Advance Care Planning: DNR  Prognosis:   < 2 weeks  Discharge Planning: Hospice facility  Primary Diagnoses: Present on Admission:  GI bleed   Physical Exam Vitals reviewed.  Constitutional:      General:  She is not in acute distress.    Appearance: She is not toxic-appearing.  HENT:     Head: Normocephalic.  Cardiovascular:     Rate and Rhythm: Normal rate.     Pulses: Normal pulses.  Pulmonary:     Effort: Pulmonary effort is normal.  Abdominal:     Palpations: Abdomen is soft.  Skin:    Comments: Intertrigo of bilateral breasts and groin, no open lesions  Neurological:     Comments: nonverbal     Palliative Assessment/Data: 20%     Thank you for this consult. Palliative medicine will continue to follow and assist holistically.   Time Total: 90 minutes Greater than 50%  of this time was spent counseling and coordinating care related to the above assessment and plan.  Signed by: Jordan Hawks, DNP, FNP-BC Palliative Medicine    Please contact Palliative Medicine Team phone at [phone] for questions and concerns.  For individual provider: See Shea Evans

## 2022-03-28 NOTE — Progress Notes (Addendum)
Per Palliative Medicine APP, pt's family requesting hospice home placement. SW spoke to pt's dtr/POA [name], she confirms agreeable to hospice home placement and is requesting [facility] [employer] [facility]). Referral made to [facility] with [facility] who will evaluate pt and f/u with pt's family. No beds available today at [facility]. SW will assist as indicated.   Wandra Feinstein, MSW, LCSW [phone] (coverage)

## 2022-03-28 NOTE — Progress Notes (Signed)
eLink Physician-Brief Progress Note Patient Name: Suzanne Monroe DOB: 01/27/1933 MRN: [id number]   Date of Service  03/28/2022  HPI/Events of Note  Patient had 2 burgundy bowel movements with  clots and BP is softer than baseline.  eICU Interventions  Stat H & H, will consider ordering CTA if there is a significant drop in hemoglobin, will also plan to transfuse PRBC's.        Kerry Kass Myrtle Haller 03/28/2022, 12:13 AM

## 2022-03-29 DIAGNOSIS — D62 Acute posthemorrhagic anemia: Secondary | ICD-10-CM | POA: Diagnosis not present

## 2022-03-29 DIAGNOSIS — E861 Hypovolemia: Secondary | ICD-10-CM

## 2022-03-29 DIAGNOSIS — I9589 Other hypotension: Secondary | ICD-10-CM

## 2022-03-29 DIAGNOSIS — E875 Hyperkalemia: Secondary | ICD-10-CM | POA: Diagnosis not present

## 2022-03-29 DIAGNOSIS — K922 Gastrointestinal hemorrhage, unspecified: Secondary | ICD-10-CM | POA: Diagnosis not present

## 2022-03-29 MED ORDER — LORAZEPAM 2 MG/ML IJ SOLN
0.5000 mg | Freq: Once | INTRAMUSCULAR | Status: AC
Start: 2022-03-29 — End: 2022-03-29
  Administered 2022-03-29: 0.5 mg via INTRAVENOUS
  Filled 2022-03-29: qty 1

## 2022-03-29 NOTE — TOC CM/SW Note (Signed)
Regis Bill has accepted bed at [facility] in [place]. [facility] requesting transport to be arranged for 5 pm.   NCM Called [facility] and arranged transport for 5 pm. [facility] paperwork and DNR form on chart.   RN please call report to [phone] prior to patient leaving the unit.

## 2022-03-29 NOTE — Progress Notes (Signed)
Patient medicated,and transported to hospice home,called placed to [phone] at 21 :26 pm and message left to call [place].

## 2022-03-29 NOTE — Progress Notes (Addendum)
[facility]) Hospital Liaison note:   Received request from [place] and Jordan Hawks FNP for family interest in Hospice home. Chart reviewed and Met with patient's daughters, Judson Roch and [name] to confirm interest and explain services. Eligibility confirmed per [facility] Dr. Regis Bill agreeable to transfer today. TOC aware.   RN please call report to [phone] prior to patient leaving the unit.   Please send signed and completed DNR with patient at discharge.    Thank you   Zigmund Gottron RN [facility] liaison [phone]

## 2022-03-29 NOTE — Progress Notes (Signed)
Per family request no assessment ,no repositioning,just comfort care.Family perform mouth  care/swap.Waiting for transport.

## 2022-03-29 NOTE — Discharge Summary (Signed)
Physician Discharge Summary  NHYIRA LEANO UXN:[id number] DOB: 04-28-1933 DOA: 03/27/2022  PCP: Patient, No Pcp Per  Admit date: 03/27/2022 Discharge date: 03/29/2022  Admitted From: Inpatient Disposition: hospice home  Recommendations for Outpatient Follow-up:  Per hospice home  Home Health:No Equipment/Devices:no new equipemnt  Discharge Condition:Serious CODE STATUS:DNR Diet recommendation: Regular healthy diet  Brief/Interim Summary:  88 year old woman with dementia for 10 years who had an unwitnessed fall on 7/20 and found to have right hip fracture, underwent right hip hemiarthroplasty was put on Eliquis For prophylaxis.  She was brought in today due to blood in her diaper, initially thought to be vaginal but then noted to be GI bleeding , dark red to black.  She was noted to be altered with a syncopal episode reported by medical staff. Initial blood pressure 80/60, hemoglobin was 8.7, last hemoglobin was 10.4 on 7/24, previous baseline 7.6 , labs significant for hyperkalemia 5.9 and BUN/creatinine of 50/1.3.  Blood transfusion was ordered and PCCM asked to admit GI was consulted.  Patient was being treated with IV Protonix gtt.  CT angiogram showed no lower GI bleed.  GI and family discussed EGD.  Family does not wish to proceed with EGD.  Palliative medicine team was consulted to assist with goals of care and patient was accepted to hospice home for end of life care.  Discharge Diagnoses:  Principal Problem:   GI bleed Acute blood loss anemia Hypotension hyperkalemia   Discharge Instructions  Discharge Instructions     Diet - low sodium heart healthy   Complete by: As directed    Increase activity slowly   Complete by: As directed    No wound care   Complete by: As directed       Allergies as of 03/29/2022       Reactions   Ampicillin Other (See Comments)   unknown   Cefaclor Diarrhea   Codeine Diarrhea   Duloxetine Diarrhea   Fexofenadine Other (See Comments)    Dizziness, fuzzy   Ibuprofen Other (See Comments)   GI upset   Loratadine Other (See Comments)   blurred vision   Nsaids Other (See Comments)   fatigue        Medication List     STOP taking these medications    apixaban 2.5 MG Tabs tablet Commonly known as: Eliquis   Calcium Carbonate-Vitamin D 600-400 MG-UNIT tablet   HYDROcodone-acetaminophen 5-325 MG tablet Commonly known as: NORCO/VICODIN   Minerin Creme Crea   neomycin-bacitracin-polymyxin [phone] ointment   zinc oxide 20 % ointment       TAKE these medications    acetaminophen 500 MG tablet Commonly known as: TYLENOL Take 1 tablet (500 mg total) by mouth every 6 (six) hours as needed for moderate pain.   amLODipine 10 MG tablet Commonly known as: NORVASC Take 1 tablet (10 mg total) by mouth daily.   Aquaphor Adv Protect Healing 41 % Oint Apply 1 Application topically daily.   divalproex 125 MG capsule Commonly known as: DEPAKOTE SPRINKLE Take 125 mg by mouth 3 (three) times daily.   donepezil 5 MG tablet Commonly known as: ARICEPT Take 1 tablet (5 mg total) by mouth at bedtime.   Ensure Original Liqd Take 237 mLs by mouth 3 (three) times daily.   furosemide 20 MG tablet Commonly known as: LASIX Take 1 tablet (20 mg total) by mouth every other day.   guaifenesin 100 MG/5ML syrup Commonly known as: ROBITUSSIN Take 10 mLs (200 mg total) by mouth every  6 (six) hours as needed for cough.   haloperidol 5 MG tablet Commonly known as: HALDOL Take 5 mg by mouth as needed for agitation.   lisinopril 40 MG tablet Commonly known as: ZESTRIL Take 40 mg by mouth daily.   loperamide 2 MG capsule Commonly known as: IMODIUM Take 2 mg by mouth as needed for diarrhea or loose stools.   magnesium hydroxide 400 MG/5ML suspension Commonly known as: MILK OF MAGNESIA Take 30 mLs by mouth at bedtime as needed for mild constipation.   melatonin 3 MG Tabs tablet Take 1 tablet (3 mg total) by mouth at  bedtime.   PARoxetine 20 MG tablet Commonly known as: PAXIL TAKE 1/2 TABLET BY MOUTH EVERY MORNING AND 1/2 TABLET EACH EVENING What changed:  how much to take how to take this when to take this additional instructions   propranolol 60 MG tablet Commonly known as: INDERAL Take 120 mg by mouth 2 (two) times daily.   Psyllium Husk 100 % Powd Take 1 packet by mouth daily as needed (For diarrhea).   QUEtiapine 25 MG tablet Commonly known as: SEROQUEL Take 1 tablet (25 mg total) by mouth at bedtime.        Allergies  Allergen Reactions   Ampicillin Other (See Comments)    unknown   Cefaclor Diarrhea   Codeine Diarrhea   Duloxetine Diarrhea   Fexofenadine Other (See Comments)    Dizziness, fuzzy   Ibuprofen Other (See Comments)    GI upset   Loratadine Other (See Comments)    blurred vision   Nsaids Other (See Comments)    fatigue    Consultations: Gi, pccm trh   Procedures/Studies: CT ANGIO GI BLEED  Result Date: 03/28/2022 CLINICAL DATA:  Lower GI bleed. EXAM: CTA ABDOMEN AND PELVIS WITHOUT AND WITH CONTRAST TECHNIQUE: Multidetector CT imaging of the abdomen and pelvis was performed using the standard protocol during bolus administration of intravenous contrast. Multiplanar reconstructed images and MIPs were obtained and reviewed to evaluate the vascular anatomy. RADIATION DOSE REDUCTION: This exam was performed according to the departmental dose-optimization program which includes automated exposure control, adjustment of the mA and/or kV according to patient size and/or use of iterative reconstruction technique. CONTRAST:  74m OMNIPAQUE IOHEXOL 350 MG/ML SOLN COMPARISON:  02/06/2021 FINDINGS: VASCULAR Aorta: Normal caliber aorta without aneurysm, dissection, vasculitis or significant stenosis. Celiac: Patent without evidence of aneurysm, dissection, vasculitis or significant stenosis. Variant left hepatic artery arises directly from the aorta. SMA: Patent without  evidence of aneurysm, dissection, vasculitis or significant stenosis. Renals: Both renal arteries are patent without evidence of aneurysm, dissection, vasculitis, fibromuscular dysplasia or significant stenosis. Accessory right renal artery evident. IMA: Patent without evidence of aneurysm, dissection, vasculitis or significant stenosis. Inflow: Patent without evidence of aneurysm, dissection, vasculitis or significant stenosis. Proximal Outflow: Bilateral common femoral and visualized portions of the superficial and profunda femoral arteries are patent without evidence of aneurysm, dissection, vasculitis or significant stenosis. Veins: No obvious venous abnormality within the limitations of this arterial phase study. Review of the MIP images confirms the above findings. NON-VASCULAR Lower chest: Dependent atelectasis bilaterally. Hepatobiliary: No suspicious focal abnormality within the liver parenchyma. Mild intrahepatic biliary duct dilatation evident. Gallbladder surgically absent. Common bile duct measures 9 mm diameter in the head of the pancreas, not substantially changed from 8 mm measured on the study from 02/06/2021. Pancreas: Diffuse distention of the main pancreatic duct noted, also not substantially changed since 02/06/2021, measuring 4 mm diameter in the head of  the pancreas. Spleen: No splenomegaly. No focal mass lesion. Adrenals/Urinary Tract: No adrenal nodule or mass. Cortical scarring noted in both kidneys. Early excretion of contrast material on precontrast imaging could obscure small nonobstructing stones. No suspicious enhancing renal mass lesion. No evidence for hydroureter. Distal ureters and posterior bladder obscured by beam hardening artifact from right hip replacement. Stomach/Bowel: Tiny hiatal hernia. Stomach otherwise unremarkable. Duodenum is normally positioned as is the ligament of Treitz. No small bowel wall thickening. No small bowel dilatation. Small bowel diverticuli noted in  the distal ileum. The terminal ileum is normal. The appendix is normal. No gross colonic mass. No colonic wall thickening. Diverticular changes are noted in the left colon without evidence of diverticulitis. No evidence for contrast extravasation in the stomach, small bowel, or colon to suggests active GI bleeding. Lymphatic: There is no gastrohepatic or hepatoduodenal ligament lymphadenopathy. No retroperitoneal or mesenteric lymphadenopathy. No pelvic sidewall lymphadenopathy. Reproductive: The uterus is unremarkable.  There is no adnexal mass. Other: No intraperitoneal free fluid. There is prominent presacral edema. Etiology is indeterminate. Musculoskeletal: Status post right total hip replacement. No worrisome lytic or sclerotic osseous abnormality. Advanced degenerative disc disease noted L3-4, L4-5, and L5-S1. IMPRESSION: 1. No evidence for contrast extravasation in the stomach, small bowel, or colon to suggests active GI bleeding. 2. Distal small bowel and left colonic diverticulosis without diverticulitis. 3. Prominent presacral edema of uncertain etiology. 4. Stable biliary and pancreatic duct dilatation since 02/06/2021. Ducts remain dilated to the level of the ampulla. Correlation with liver function test may prove helpful. Electronically Signed   By: [name] M.D.   On: 03/28/2022 11:35   DG Chest Port 1 View  Result Date: 03/27/2022 CLINICAL DATA:  hypoxia EXAM: PORTABLE CHEST 1 VIEW COMPARISON:  March 11, 2022 FINDINGS: The cardiomediastinal silhouette is unchanged in contour.Atherosclerotic calcifications of the aorta. No pleural effusion. No pneumothorax. No acute pleuroparenchymal abnormality. Visualized abdomen is unremarkable. Status post LEFT shoulder arthroplasty. IMPRESSION: No acute cardiopulmonary abnormality. Electronically Signed   By: Valentino Saxon M.D.   On: 03/27/2022 14:36   Pelvis Portable  Result Date: 03/12/2022 CLINICAL DATA:  Status post right hip arthroplasty  EXAM: PORTABLE PELVIS 1-2 VIEWS COMPARISON:  None Available. FINDINGS: Status post right hip total arthroplasty with expected overlying postoperative change. No evidence of perihardware fracture or component malpositioning. IMPRESSION: Status post right hip total arthroplasty with expected overlying postoperative change. No evidence of perihardware fracture or component malpositioning. Electronically Signed   By: Delanna Ahmadi M.D.   On: 03/12/2022 18:30   DG HIP UNILAT WITH PELVIS 1V RIGHT  Result Date: 03/12/2022 CLINICAL DATA:  Right total hip replacement EXAM: DG HIP (WITH OR WITHOUT PELVIS) 1V RIGHT COMPARISON:  03/11/2022 FINDINGS: Intraoperative fluoroscopy was obtained for surgical control purposes. Fluoroscopy time recorded at 6 seconds. Dose 0.6678 mGy. Four spot fluoroscopic images are submitted. Spot fluoroscopic images demonstrate initially a fracture of the right femoral neck. Subsequent images demonstrate placement of a right hip arthroplasty. Component is incompletely visualized. IMPRESSION: Intraoperative fluoroscopy obtained for surgical control purposes demonstrating right hip arthroplasty. Electronically Signed   By: Lucienne Capers M.D.   On: 03/12/2022 17:08   DG C-Arm 1-60 Min-No Report  Result Date: 03/12/2022 Fluoroscopy was utilized by the requesting physician.  No radiographic interpretation.   DG C-Arm 1-60 Min-No Report  Result Date: 03/12/2022 Fluoroscopy was utilized by the requesting physician.  No radiographic interpretation.   DG Knee Right Port  Result Date: 03/12/2022 CLINICAL DATA:  Right  femur fracture. EXAM: PORTABLE RIGHT KNEE - 1-2 VIEW COMPARISON:  Right knee x-rays dated June 27, 2008. FINDINGS: No evidence of fracture, dislocation, or joint effusion. No evidence of arthropathy or other focal bone abnormality. Soft tissues are unremarkable. IMPRESSION: Negative. Electronically Signed   By: Titus Dubin M.D.   On: 03/12/2022 09:03   DG Hip Unilat  With Pelvis 2-3 Views Right  Result Date: 03/11/2022 CLINICAL DATA:  Fall EXAM: DG HIP (WITH OR WITHOUT PELVIS) 2-3V RIGHT COMPARISON:  No prior hip radiographs, correlation is made with CT abdomen pelvis 02/06/2021 FINDINGS: Impacted, foreshortened fracture of the right femoral neck. No other fracture is seen in the pelvis or left hip degenerative changes in the lumbar spine. Nonobstructive bowel-gas pattern. IMPRESSION: Right femoral neck fracture. Electronically Signed   By: Merilyn Baba M.D.   On: 03/11/2022 18:27   DG Chest 1 View  Result Date: 03/11/2022 CLINICAL DATA:  Un witnessed fall, right hip pain EXAM: CHEST  1 VIEW COMPARISON:  02/09/2021 FINDINGS: Single frontal view of the chest demonstrates an unremarkable cardiac silhouette. No airspace disease, effusion, or pneumothorax. Left shoulder arthroplasty. No acute fractures. IMPRESSION: 1. No acute intrathoracic process. Electronically Signed   By: Randa Ngo M.D.   On: 03/11/2022 18:26      Subjective: No acute changes, dementia with minimal interaction  Discharge Exam: Vitals:   03/29/22 0541 03/29/22 0727  BP: (!) 111/52 (!) 129/55  Pulse: 69 72  Resp: 16 16  Temp: 98.3 F (36.8 C) 97.7 F (36.5 C)  SpO2: 100% 100%   Vitals:   03/29/22 0037 03/29/22 0500 03/29/22 0541 03/29/22 0727  BP: (!) 124/57  (!) 111/52 (!) 129/55  Pulse: 66  69 72  Resp: '16  16 16  '$ Temp: (!) 97.5 F (36.4 C)  98.3 F (36.8 C) 97.7 F (36.5 C)  TempSrc: Oral  Axillary Oral  SpO2: 100%  100% 100%  Weight:  60.6 kg      General: Pt is arousable but with advanced dementia Cardiovascular: RRR, S1/S2 +, no rubs, no gallops Respiratory: CTA bilaterally, no wheezing, no rhonchi Abdominal: Soft, NT, ND, bowel sounds + Extremities: no edema, no cyanosis    The results of significant diagnostics from this hospitalization (including imaging, microbiology, ancillary and laboratory) are listed below for reference.      Microbiology: Recent Results (from the past 240 hour(s))  MRSA Next Gen by PCR, Nasal     Status: Abnormal   Collection Time: 03/27/22  6:02 PM   Specimen: Nasal Mucosa; Nasal Swab  Result Value Ref Range Status   MRSA by PCR Next Gen DETECTED (A) NOT DETECTED Final    Comment: RESULT CALLED TO, READ BACK BY AND VERIFIED WITH: RN MINDY.E AT 2013 ON 03/27/2022 BY T.SAAD. (NOTE) The GeneXpert MRSA Assay (FDA approved for NASAL specimens only), is one component of a comprehensive MRSA colonization surveillance program. It is not intended to diagnose MRSA infection nor to guide or monitor treatment for MRSA infections. Test performance is not FDA approved in patients less than 7 years old. Performed at [facility], [place] [address], [place], [place] [address]      Labs: BNP (last 3 results) No results for input(s): "BNP" in the last 8760 hours. Basic Metabolic Panel: Recent Labs  Lab 03/27/22 1421 03/27/22 1427 03/27/22 2023 03/28/22 0100  NA 141 138 141 139  K 5.1 5.9* 4.7 4.3  CL 111 109 113* 111  CO2 22  --  22 16*  GLUCOSE 145* 139* 124* 136*  BUN 36* 50* 41* 44*  CREATININE 1.28* 1.30* 1.11* 0.94  CALCIUM 8.2*  --  8.2* 7.9*  MG  --   --  2.1 1.9  PHOS  --   --  3.2 2.8   Liver Function Tests: Recent Labs  Lab 03/27/22 1421  AST 16  ALT 9  ALKPHOS 79  BILITOT 0.7  PROT 4.6*  ALBUMIN 2.3*   No results for input(s): "LIPASE", "AMYLASE" in the last 168 hours. No results for input(s): "AMMONIA" in the last 168 hours. CBC: Recent Labs  Lab 03/27/22 1421 03/27/22 1427 03/27/22 2023 03/28/22 0100 03/28/22 0755  WBC 13.3*  --  10.8* 12.9* 12.5*  NEUTROABS 9.8*  --   --   --   --   HGB 8.7* 8.2* 11.0* 9.3* 8.2*  HCT 27.0* 24.0* 33.3* 28.9* 25.1*  MCV 100.4*  --  93.5 94.1 93.3  PLT 483*  --  343 378 357   Cardiac Enzymes: No results for input(s): "CKTOTAL", "CKMB", "CKMBINDEX", "TROPONINI" in the last 168 hours. BNP: Invalid input(s):  "POCBNP" CBG: Recent Labs  Lab 03/28/22 0751 03/28/22 1135  GLUCAP 128* 115*   D-Dimer No results for input(s): "DDIMER" in the last 72 hours. Hgb A1c No results for input(s): "HGBA1C" in the last 72 hours. Lipid Profile No results for input(s): "CHOL", "HDL", "LDLCALC", "TRIG", "CHOLHDL", "LDLDIRECT" in the last 72 hours. Thyroid function studies No results for input(s): "TSH", "T4TOTAL", "T3FREE", "THYROIDAB" in the last 72 hours.  Invalid input(s): "FREET3" Anemia work up Recent Labs    03/27/22 1421  VITAMINB12 1,089*  FOLATE 13.7  FERRITIN 67  TIBC 228*  IRON 68  RETICCTPCT 3.7*   Urinalysis    Component Value Date/Time   COLORURINE YELLOW 02/06/2021 1330   APPEARANCEUR HAZY (A) 02/06/2021 1330   LABSPEC 1.014 02/06/2021 1330   PHURINE 5.0 02/06/2021 1330   GLUCOSEU NEGATIVE 02/06/2021 1330   HGBUR LARGE (A) 02/06/2021 1330   HGBUR negative 09/12/2009 1454   BILIRUBINUR NEGATIVE 02/06/2021 1330   KETONESUR NEGATIVE 02/06/2021 1330   PROTEINUR 30 (A) 02/06/2021 1330   UROBILINOGEN 0.2 09/12/2009 1454   NITRITE NEGATIVE 02/06/2021 1330   LEUKOCYTESUR LARGE (A) 02/06/2021 1330   Sepsis Labs Recent Labs  Lab 03/27/22 1421 03/27/22 2023 03/28/22 0100 03/28/22 0755  WBC 13.3* 10.8* 12.9* 12.5*   Microbiology Recent Results (from the past 240 hour(s))  MRSA Next Gen by PCR, Nasal     Status: Abnormal   Collection Time: 03/27/22  6:02 PM   Specimen: Nasal Mucosa; Nasal Swab  Result Value Ref Range Status   MRSA by PCR Next Gen DETECTED (A) NOT DETECTED Final    Comment: RESULT CALLED TO, READ BACK BY AND VERIFIED WITH: RN MINDY.E AT 2013 ON 03/27/2022 BY T.SAAD. (NOTE) The GeneXpert MRSA Assay (FDA approved for NASAL specimens only), is one component of a comprehensive MRSA colonization surveillance program. It is not intended to diagnose MRSA infection nor to guide or monitor treatment for MRSA infections. Test performance is not FDA approved in  patients less than 30 years old. Performed at [facility], [place] [address], [place], [place] [address]      Time coordinating discharge: Over 30 minutes  SIGNED:   Nicolette Bang, MD  [facility] 03/29/2022, 12:59 PM Pager   If 7PM-7AM, please contact night-coverage [email or web address] Password [email or web address]

## 2022-03-29 NOTE — Progress Notes (Signed)
Patient PR:FFMB S [place]ford      DOB: 1933-08-03      WGY:[id number]      Palliative Medicine Team    Subjective: Bedside symptom check completed. Two daughters and [facility] nurse liaisons bedside at time of visit.    Physical exam: Patient resting in bed with eyes closed at time of visit. Breathing even and non-labored with nasal cannula applied, no excessive secretions noted. Patient without physical or non-verbal signs of pain or discomfort at this time.    Assessment and plan: This RN offers support to family bedside. They share that they are ready to move forward with transfer to residential hospice as able. They endorse that the patient has been comfortable. They ask questions related to patient care, this RN provided education on trying to concentrate care to minimize disruption of comfort and require less medication assistance. They were understanding, all questions answered at this time. Patient remains stable for transfer this morning, remains appropriate for residential hospice facility. Will continue to follow for any changes or advances.    Thank you for allowing the Palliative Medicine Team to assist in the care of this patient.     Damian Leavell, MSN, RN Palliative Medicine Team Team Phone: [phone]  This phone is monitored 7a-7p, please reach out to attending physician outside of these hours for urgent needs.

## 2022-03-31 LAB — TYPE AND SCREEN
ABO/RH(D): O POS
Antibody Screen: POSITIVE
Donor AG Type: NEGATIVE
Donor AG Type: NEGATIVE
Donor AG Type: NEGATIVE
Donor AG Type: NEGATIVE
PT AG Type: NEGATIVE
Unit division: 0
Unit division: 0
Unit division: 0
Unit division: 0

## 2022-03-31 LAB — BPAM RBC
Blood Product Expiration Date: 202309042359
Blood Product Expiration Date: 202309052359
Blood Product Expiration Date: 202309072359
Blood Product Expiration Date: 202309112359
ISSUE DATE / TIME: 202308051425
ISSUE DATE / TIME: 202308051431
Unit Type and Rh: 5100
Unit Type and Rh: 5100
Unit Type and Rh: 5100
Unit Type and Rh: 9500

## 2022-04-23 DEATH — deceased

## 2023-05-15 IMAGING — CT CT HEAD W/O CM
4 series · 17 of 47 positions shown, 19 images · non-contrast
Comparison: CT head 06/27/2008

CLINICAL DATA: Fall.  Head injury

EXAM:
CT HEAD WITHOUT CONTRAST
TECHNIQUE: Contiguous axial images were obtained from the base of the skull
through the vertex without intravenous contrast.

[Series 3: head wo · axial · 0.40mm/px · z∈[-107,+13]mm · 7 of 33 slices shown, 9 images]
[im 5/33  brain]
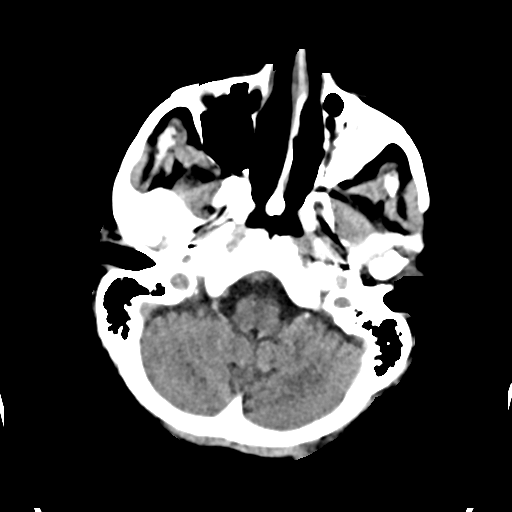
[im 5/33  bone]
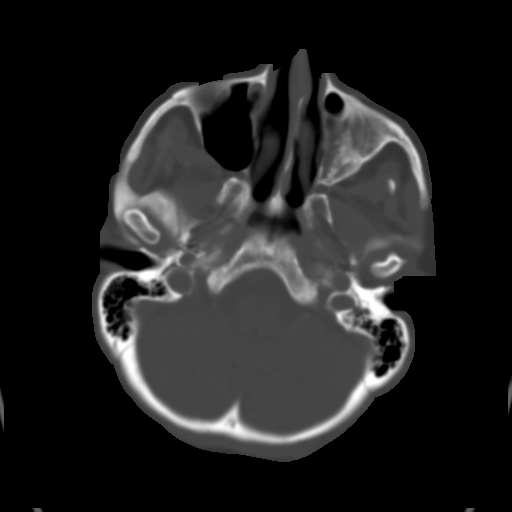
[im 9/33  brain]
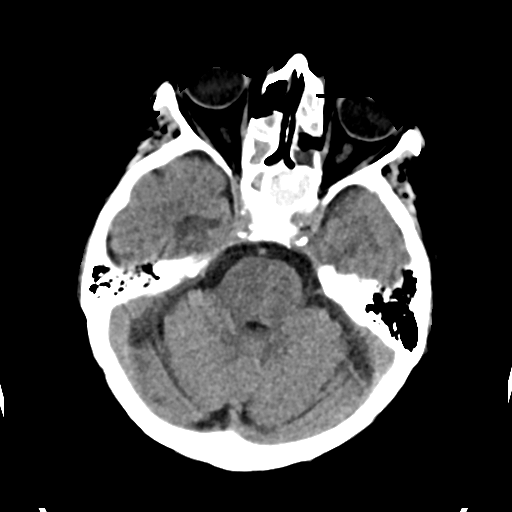
[im 13/33  brain]
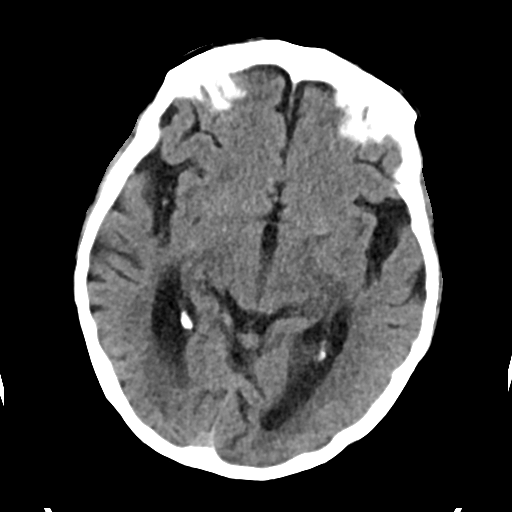
[im 17/33  brain]
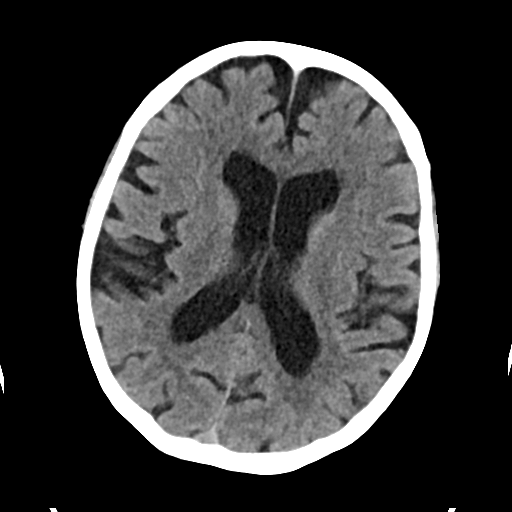
[im 21/33  brain]
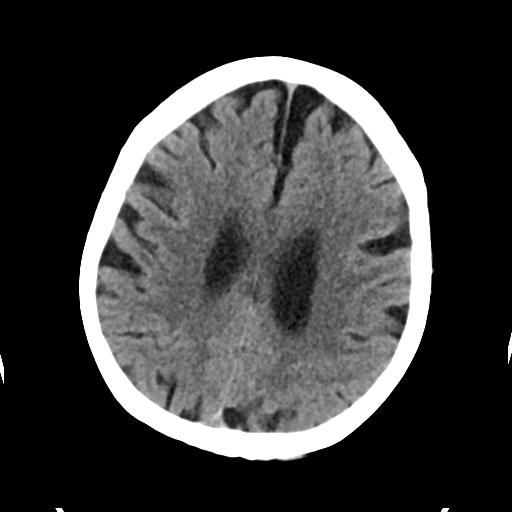
[im 21/33  bone]
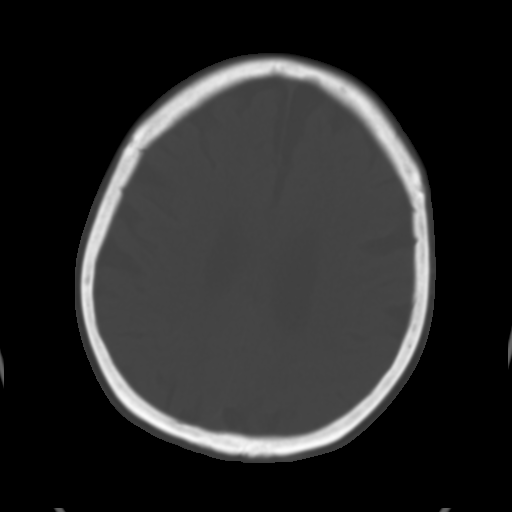
[im 25/33  brain]
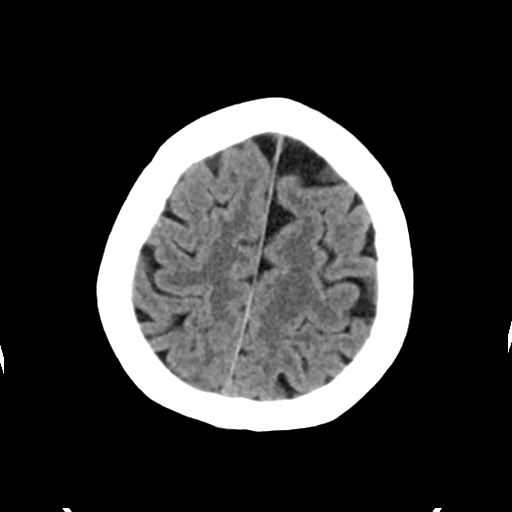
[im 29/33  brain]
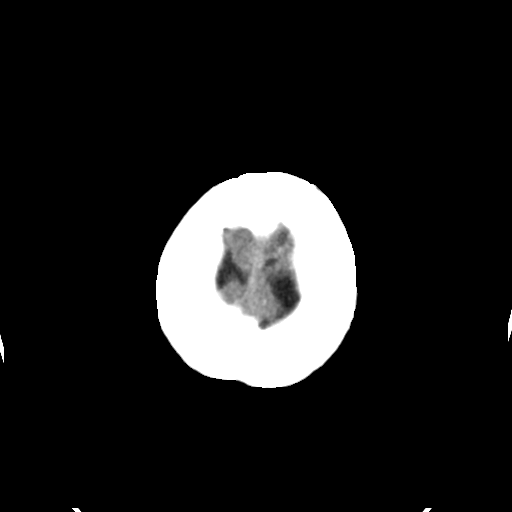

[Series 4: head bone · axial · 0.40mm/px · z∈[-111,-55]mm · 4 of 81 slices shown]
[im 9/81  bone]
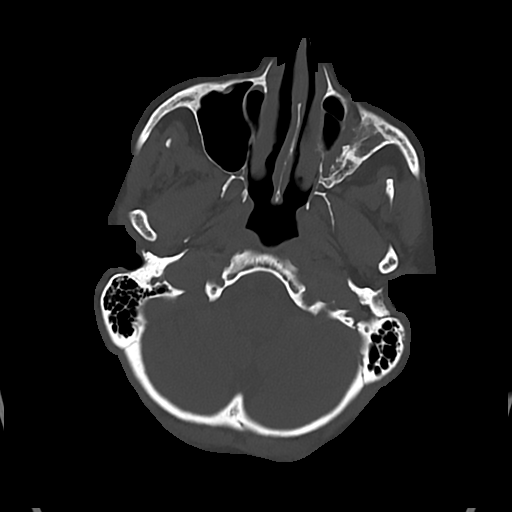
[im 17/81  bone]
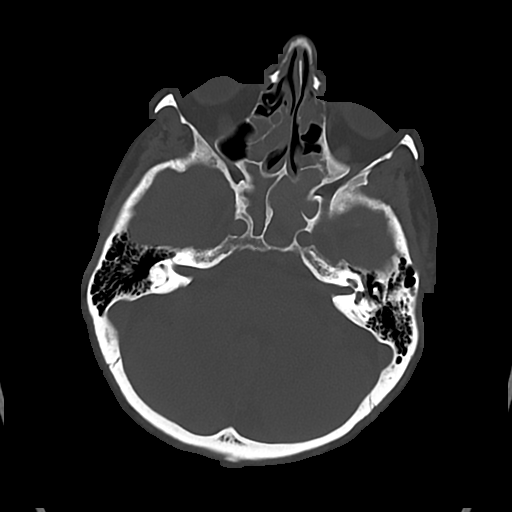
[im 25/81  bone]
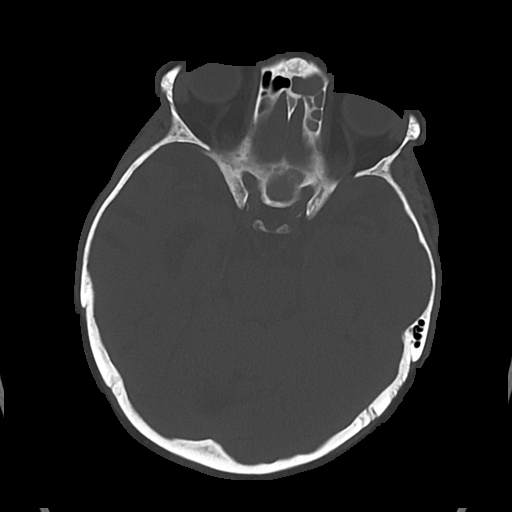
[im 37/81  bone]
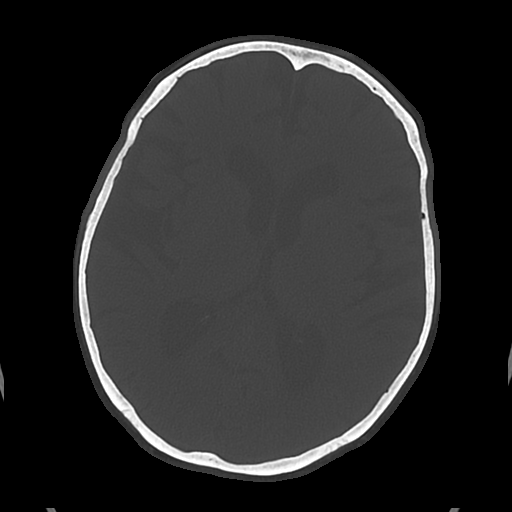

[Series 5: cor soft · coronal · 0.31mm/px · 3 of 67 slices shown]
[im 23/67  brain]
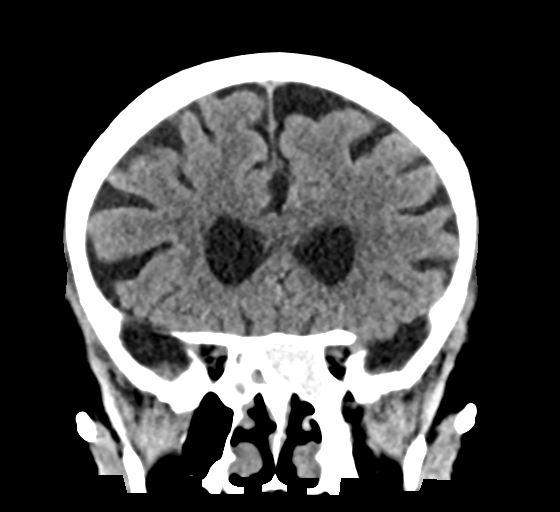
[im 30/67  brain]
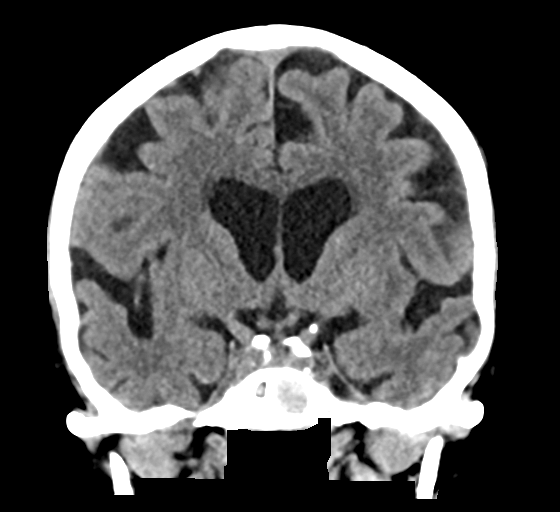
[im 37/67  brain]
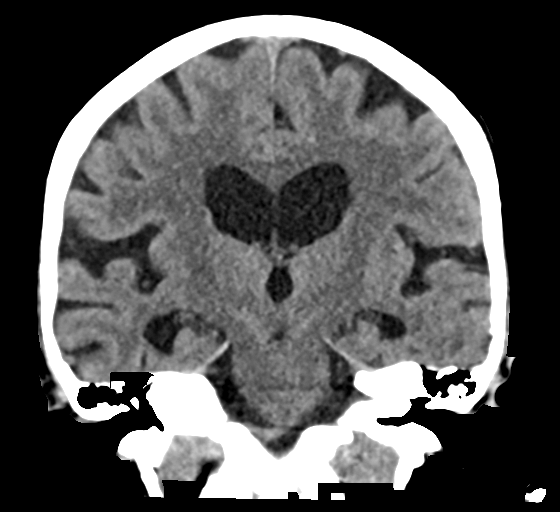

[Series 6: sag soft · sagittal · 0.31mm/px · 3 of 59 slices shown]
[im 20/59  brain]
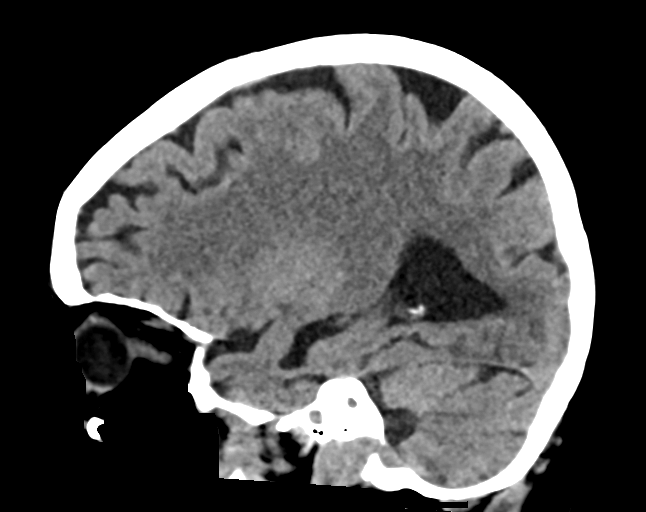
[im 30/59  brain]
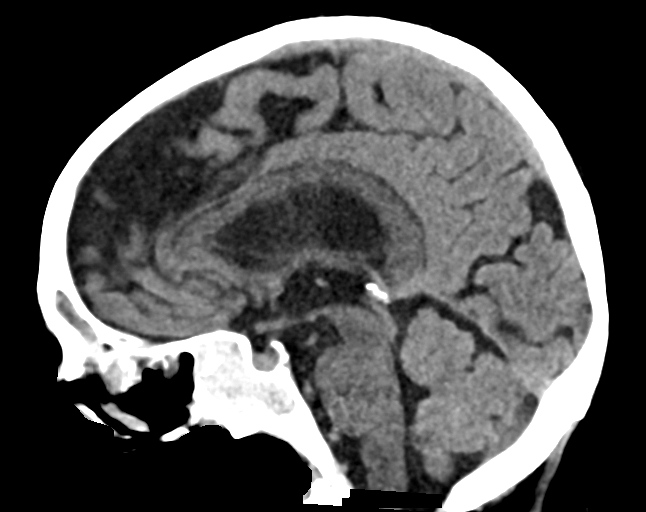
[im 39/59  brain]
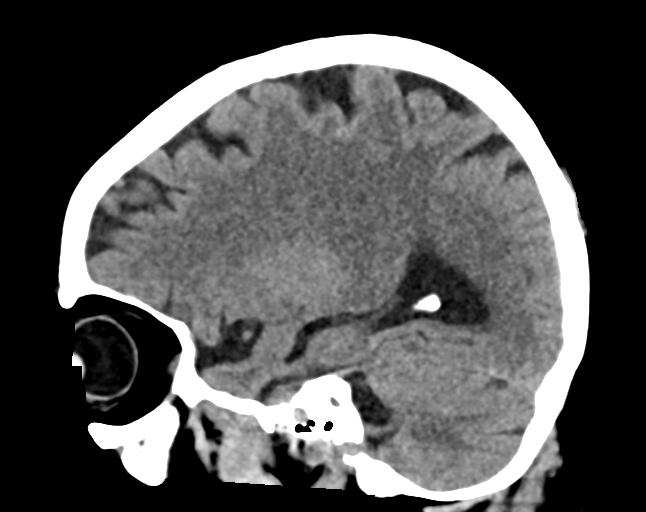

[17 of 47 positions shown; findings below may reference images not displayed]

FINDINGS: Brain: Moderate atrophy with progression. Negative for acute
infarct, hemorrhage, mass.

Vascular: Negative for hyperdense vessel

Skull: Negative for skull fracture

Sinuses/Orbits: Extensive mucosal edema in the paranasal sinuses.
Bony thickening and contraction of left maxillary sinus. Negative
orbit

Other: None
IMPRESSION: Moderate atrophy.  No acute intracranial abnormality.
